# Patient Record
Sex: Male | Born: 2005
Health system: Southern US, Community
[De-identification: ages and names within clinical notes are randomized; demographics above are authoritative.]

## PROBLEM LIST (undated history)

## (undated) VITALS — BP 121/90 | HR 80 | Temp 97.8°F | Resp 14 | Ht 65.55 in | Wt 192.7 lb

## (undated) DIAGNOSIS — T4145XA Adverse effect of unspecified anesthetic, initial encounter: Secondary | ICD-10-CM

## (undated) DIAGNOSIS — L309 Dermatitis, unspecified: Secondary | ICD-10-CM

## (undated) DIAGNOSIS — S92353A Displaced fracture of fifth metatarsal bone, unspecified foot, initial encounter for closed fracture: Secondary | ICD-10-CM

## (undated) DIAGNOSIS — F329 Major depressive disorder, single episode, unspecified: Secondary | ICD-10-CM

## (undated) DIAGNOSIS — J45909 Unspecified asthma, uncomplicated: Secondary | ICD-10-CM

## (undated) DIAGNOSIS — T8859XA Other complications of anesthesia, initial encounter: Secondary | ICD-10-CM

## (undated) DIAGNOSIS — F32A Depression, unspecified: Secondary | ICD-10-CM

## (undated) DIAGNOSIS — Z889 Allergy status to unspecified drugs, medicaments and biological substances status: Secondary | ICD-10-CM

## (undated) DIAGNOSIS — F649 Gender identity disorder, unspecified: Secondary | ICD-10-CM

## (undated) HISTORY — DX: Unspecified asthma, uncomplicated: J45.909

## (undated) HISTORY — PX: TONSILLECTOMY: SUR1361

## (undated) HISTORY — DX: Displaced fracture of fifth metatarsal bone, unspecified foot, initial encounter for closed fracture: S92.353A

## (undated) HISTORY — DX: Gender identity disorder, unspecified: F64.9

## (undated) HISTORY — DX: Allergy status to unspecified drugs, medicaments and biological substances: Z88.9

## (undated) HISTORY — PX: TYMPANOSTOMY TUBE PLACEMENT: SHX32

---
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
## 2023-01-01 MED ORDER — BACITRACIN-NEOMYCIN-POLYMYXIN OINTMENT TUBE: TOPICAL_OINTMENT | CUTANEOUS | Status: DC | PRN

## 2023-01-01 NOTE — Group Note (Signed)
LCSW Group Therapy Note   Group Date: 01/01/2023 Start Time: 1330 End Time: 1448   Type of Therapy and Topic:  Group Therapy: What is bullying and How to deal with Bullying   Participation Level:  Minimal             Description of Group:   In this group session, patients learned how to define bullying and explored the different types of bullying. Patients were asked to identify examples of each type. Patients were encouraged to share a personal story related to bulling and how they handled it. Patients analyzed their responses. Patients were provided cards with different ways people bully: rumors, harassment, name calling, intimidation, hate speech, threats, etc. Patients were asked what ways people bully are the easiest to deal with and the hardest. CSW provided education on reasons people bully and the ways to handle bullying. CSW utilized animated video clips to explore positive ways the scenarios could be handled. CSW closed group with positive quotes related to bullying and overcoming bullying.   Therapeutic Goals: Patients will learn the definition and types of bullying. Patients will share personal stories and analyze their responses.  Patients will be provided education and positive ways to cope with bullying. Patients will utilize video clips to identify solutions for scenarios related to bullying.  Patients will get a hard copy of positive quotes related to bullying and are encouraged to use positive self talk.   Summary of Patient Progress:  Patient was engaged and participated in the activity as well as discussion. Patient shared his thoughts about hearing and dealing with bullying. Patient described different types of cyber bullying he has experienced being on the game. Patient was appropriate and respectful to his peers.     Therapeutic Modalities:   Cognitive Behavioral Therapy Motivational Interviewing  Brief Therapy  Beather Arbour  01/01/2023 3:27 PM

## 2023-01-01 NOTE — Progress Notes (Signed)
[facility] MD Progress Note  01/01/2023 12:55 PM Antonio [name]  MRN:  [id number]  Subjective: " I have been doing fine, able to socialize with other people and not have any urges to cut myself not suicidal and learning better coping mechanisms by participating group activities."  Reason for Admission: Antonio [name] "Antonio [name]" is an 17 year old transgender male on hormone therapy, domiciled with biological mother and stepfather, 12th grader at [employer] with PPhx of social anxiety disorder, MDD, GAD, ASD who presented Voluntary to [facility] after lacerating arm with pencil sharpener blade. Patient has had similar incidents occur in the past few months.   On evaluation the patient reported: He is a reported that he has been participating in treatment program, attending group activities socializing with peer members and communicating well with the staff on the unit.  Patient reported he had a good sleep last night his appetite has been fine.  Patient reported his medication naltrexone was increased which she has been tolerating well without having any side effects.  Patient continued to work on his coping skills which he had multiple of them including writing poetry, listening music, writing music, walking away from problems or spending time with his pet dog and playing video games, drawing and talking with the parents and therapist.  Patient reported he did not use his coping skills before he cut himself.  Patient reported he cuts exactly same place.  Patient minimizes his symptoms of depression anxiety and anger by rating 0 on the scale of 1-10, 10 being the highest severity.    Patient denies side effects to the medications, reporting that they are helpful with their depression and anxiety. Patient states goal today is to work on using coping skills. I discussed he needs to work on addressing reasons he lacerates his left forearm in same spot.  Patient denied SI/HI/AVH, and contract for safety while  being in hospital and minimized current safety issues. Patient had no other questions or concerns, and was amenable to plan per below.    Principal Problem: Deliberate self-cutting Diagnosis: Principal Problem:   Deliberate self-cutting Active Problems:   Gender dysphoria   Social anxiety disorder   Generalized anxiety disorder   Total Time spent with patient: 30 minutes  Past Psychiatric History: As mentioned in history and physical, reviewed today and no additional data.   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   Asthma    Bupropion overdose 01/16/2020   Complication of anesthesia    gets disoriented and shakes after surgery   Deliberate self-cutting 10/17/2018   Depression    Eczema    Fracture of 5th metatarsal    Gender dysphoria    Intentional overdose of drug in tablet form 04/03/2020   MDD (major depressive disorder), recurrent severe, without psychosis 11/02/2021   Moderate episode of recurrent major depressive disorder 04/24/2020   Multiple allergies    Severe episode of recurrent major depressive disorder, without psychotic features 01/02/2020   Suicidal ideations 08/31/2022   Suicide attempt by cutting of wrist 07/28/2022    Past Surgical History:  Procedure Laterality Date   SUPPRELIN IMPLANT Left 01/22/2019   Procedure: SUPPRELIN IMPLANT;  Surgeon: Kandice Hams, MD;  Location: [facility];  Service: Pediatrics;  Laterality: Left;   SUPPRELIN REMOVAL N/A 03/03/2020   Procedure: SUPPRELIN REMOVAL;  Surgeon: Kandice Hams, MD;  Location: [facility];  Service: Pediatrics;  Laterality: N/A;   TONSILLECTOMY     TYMPANOSTOMY TUBE PLACEMENT     Family History:  Family History  Problem Relation Age of Onset   Depression Mother    Anxiety disorder Mother    Hypertension Maternal Grandmother    Allergic rhinitis Neg  Hx    Angioedema Neg Hx    Atopy Neg Hx    Eczema Neg Hx    Immunodeficiency Neg Hx    Urticaria Neg Hx    Family Psychiatric  History: As mentioned in history and physical, reviewed today no additional data.  Social History:  Social History   Substance and Sexual Activity  Alcohol Use No     Social History   Substance and Sexual Activity  Drug Use Yes   Types: Marijuana    Social History   Socioeconomic History   Marital status: Single    Spouse name: Not on file   Number of children: Not on file   Years of education: Not on file   Highest education level: Not on file  Occupational History   Not on file  Tobacco Use   Smoking status: Never   Smokeless tobacco: Never  Vaping Use   Vaping Use: Former   Substances: Nicotine, Flavoring   Devices: Nicotine  Substance and Sexual Activity   Alcohol use: No   Drug use: Yes    Types: Marijuana   Sexual activity: Not Currently  Other Topics Concern   Not on file  Social History Narrative   Lives with mom and stepdad.      He is in 12th grade at [employer] 23-24 school year      He enjoys writing and listen to music   Social Determinants of Health   Financial Resource Strain: Not on file  Food Insecurity: Not on file  Transportation Needs: Not on file  Physical Activity: Not on file  Stress: Not on file  Social Connections: Not on file   Additional Social History:     Current Medications: Current Facility-Administered Medications  Medication Dose Route Frequency Provider Last Rate Last Admin   alum & mag hydroxide-simeth (MAALOX/MYLANTA) 200-200-20 MG/5ML suspension 30 mL  30 mL Oral Q6H PRN Motley-Mangrum, Jadeka A, PMHNP       ARIPiprazole (ABILIFY) tablet 5 mg  5 mg Oral QHS Motley-Mangrum, Jadeka A, PMHNP   5 mg at 12/31/22 2058   busPIRone (BUSPAR) tablet 5 mg  5 mg Oral BID Motley-Mangrum, Jadeka A, PMHNP   5 mg at 01/01/23 0813   diphenhydrAMINE (BENADRYL) capsule 50 mg  50 mg Oral TID PRN  Motley-Mangrum, Jadeka A, PMHNP       Or   diphenhydrAMINE (BENADRYL) injection 50 mg  50 mg Intramuscular TID PRN Motley-Mangrum, Jadeka A, PMHNP       fluticasone (FLONASE) 50 MCG/ACT nasal spray 1 spray  1 spray Each Nare BID PRN Motley-Mangrum, Jadeka A, PMHNP       haloperidol (HALDOL) tablet 5 mg  5 mg Oral TID PRN Motley-Mangrum, Jadeka A, PMHNP       Or   haloperidol lactate (HALDOL) injection 5 mg  5 mg Intramuscular TID PRN Motley-Mangrum, Jadeka A, PMHNP       hydrOXYzine (ATARAX) tablet 25 mg  25 mg Oral TID PRN Motley-Mangrum, Jadeka A, PMHNP   25 mg at 12/30/22 2054   loratadine (CLARITIN) tablet 10 mg  10 mg Oral Daily Park Pope, MD   10 mg at  01/01/23 0813   LORazepam (ATIVAN) tablet 2 mg  2 mg Oral TID PRN Motley-Mangrum, Geralynn Ochs A, PMHNP       Or   LORazepam (ATIVAN) injection 2 mg  2 mg Intramuscular TID PRN Motley-Mangrum, Jadeka A, PMHNP       magnesium hydroxide (MILK OF MAGNESIA) suspension 30 mL  30 mL Oral Daily PRN Motley-Mangrum, Jadeka A, PMHNP       naltrexone (DEPADE) tablet 100 mg  100 mg Oral Daily Park Pope, MD   100 mg at 01/01/23 0813   propranolol (INDERAL) tablet 10 mg  10 mg Oral QHS Motley-Mangrum, Jadeka A, PMHNP   10 mg at 12/31/22 2058   [START ON 01/02/2023] testosterone cypionate (DEPOTESTOSTERONE CYPIONATE) injection 80 mg  80 mg Intramuscular Q7 days Motley-Mangrum, Jadeka A, PMHNP       venlafaxine XR (EFFEXOR-XR) 24 hr capsule 150 mg  150 mg Oral Q breakfast Motley-Mangrum, Jadeka A, PMHNP   150 mg at 01/01/23 8413    Lab Results:  Results for orders placed or performed during the hospital encounter of 12/29/22 (from the past 48 hour(s))  Hemoglobin A1c     Status: None   Collection Time: 12/30/22  6:46 PM  Result Value Ref Range   Hgb A1c MFr Bld 5.1 4.8 - 5.6 %    Comment: (NOTE) Pre diabetes:          5.7%-6.4%  Diabetes:              >6.4%  Glycemic control for   <7.0% adults with diabetes    Mean Plasma Glucose 99.67 mg/dL     Comment: Performed at [facility], [address] [place], [place] [address]  Lipid panel     Status: Abnormal   Collection Time: 12/30/22  6:46 PM  Result Value Ref Range   Cholesterol 200 (H) 0 - 169 mg/dL   Triglycerides 027 <253 mg/dL   HDL 40 (L) >66 mg/dL   Total CHOL/HDL Ratio 5.0 RATIO   VLDL 23 0 - 40 mg/dL   LDL Cholesterol 440 (H) 0 - 99 mg/dL    Comment:        Total Cholesterol/HDL:CHD Risk Coronary Heart Disease Risk Table                     Men   Women  1/2 Average Risk   3.4   3.3  Average Risk       5.0   4.4  2 X Average Risk   9.6   7.1  3 X Average Risk  23.4   11.0        Use the calculated Patient Ratio above and the CHD Risk Table to determine the patient's CHD Risk.        ATP III CLASSIFICATION (LDL):  <100     mg/dL   Optimal  347-425  mg/dL   Near or Above                    Optimal  130-159  mg/dL   Borderline  956-387  mg/dL   High  >564     mg/dL   Very High Performed at [facility], [address], [place], [place] [address]   TSH     Status: None   Collection Time: 12/30/22  6:46 PM  Result Value Ref Range   TSH 4.391 0.350 - 4.500 uIU/mL    Comment: Performed by a 3rd  Generation assay with a functional sensitivity of <=0.01 uIU/mL. Performed at [facility], [address] [place], [place] [address]     Blood Alcohol level:  Lab Results  Component Value Date   ETH <10 12/28/2022   ETH <10 11/02/2022    Metabolic Disorder Labs: Lab Results  Component Value Date   HGBA1C 5.1 12/30/2022   MPG 99.67 12/30/2022   MPG 105 09/01/2022   Lab Results  Component Value Date   PROLACTIN 10.0 09/01/2022   PROLACTIN 10.3 04/16/2022   Lab Results  Component Value Date   CHOL 200 (H) 12/30/2022   TRIG 114 12/30/2022   HDL 40 (L) 12/30/2022   CHOLHDL 5.0 12/30/2022   VLDL 23 12/30/2022   LDLCALC 137 (H) 12/30/2022   LDLCALC 170 (H) 09/01/2022    Physical Findings: AIMS:  , ,  ,  ,     CIWA:    COWS:     Musculoskeletal: Strength & Muscle Tone: within normal limits Gait & Station: normal Patient leans: N/A   Psychiatric Specialty Exam: Presentation  General Appearance:  Appropriate for Environment; Casual   Eye Contact: Good   Speech: Clear and Coherent; Normal Rate   Speech Volume: Normal   Handedness: Right   Mood and Affect  Mood: Euthymic   Affect: Appropriate; Congruent   Thought Process  Thought Processes: Coherent; Goal Directed; Linear   Descriptions of Associations:Intact   Orientation:Full (Time, Place and Person)   Thought Content:Logical   History of Schizophrenia/Schizoaffective disorder:No data recorded  Duration of Psychotic Symptoms:No data recorded Hallucinations:No data recorded   Ideas of Reference:None   Suicidal Thoughts:No data recorded   Homicidal Thoughts:No data recorded   Sensorium  Memory: Remote Good   Judgment: Poor   Insight: Fair   Art therapist  Concentration: Fair   Attention Span: Fair   Recall: [employer] of Knowledge: Fair   Language: Fair   Psychomotor Activity  Psychomotor Activity: No data recorded   Assets  Assets: Manufacturing systems engineer; Housing; Social Support   Sleep  Sleep: No data recorded   Physical Exam: Physical Exam Blood pressure 129/84, pulse 76, temperature 98.4 F (36.9 C), resp. rate 17, height 5' 3.78" (1.62 m), weight 89.7 kg, SpO2 100 %. Body mass index is 34.19 kg/m.  Labs    Latest Ref Rng & Units 12/28/2022    1:41 PM 11/02/2022   12:50 PM 09/01/2022    7:25 AM  CMP  Glucose 70 - 99 mg/dL 85  85  86   BUN 6 - 20 mg/dL 12  13  13    Creatinine 0.44 - 1.00 mg/dL 6.04  5.40  9.81   Sodium 135 - 145 mmol/L 132  138  140   Potassium 3.5 - 5.1 mmol/L 4.1  4.0  4.3   Chloride 98 - 111 mmol/L 104  102  101   CO2 22 - 32 mmol/L 24  26  31    Calcium 8.9 - 10.3 mg/dL 9.0  9.4  9.6   Total Protein 6.5 - 8.1  g/dL 8.2   8.7   Total Bilirubin 0.3 - 1.2 mg/dL 0.5   0.7   Alkaline Phos 38 - 126 U/L 82   78   AST 15 - 41 U/L 24   17   ALT 0 - 44 U/L 29   17       Latest Ref Rng & Units 12/28/2022    1:41 PM 11/02/2022   12:50 PM  09/01/2022    7:25 AM  CBC  WBC 4.0 - 10.5 K/uL 5.2  5.0  5.4   Hemoglobin 12.0 - 15.0 g/dL 16.1  09.6  04.5   Hematocrit 36.0 - 46.0 % 41.5  39.9  51.9   Platelets 150 - 400 K/uL 282  283  274    UPT negative, UDS negative, ethanol negative  Treatment Plan Summary: Reviewed current treatment plan on 01/01/2023  Continue safety monitoring and current medications. He reported that he has a plans about keeping himself safe and also family has a plans about residential placement and coming summer . Will continue his current medication Abilify 5 mg daily at bedtime, BuSpar 5 mg 2 times daily, Atarax 25 mg 3 times daily as needed, Effexor XR 150 mg daily with breakfast and propranolol 10 mg daily at bedtime and also continue his Depo testosterone was 80 mg intramuscular every 7 days.  Patient naltrexone was increased to 100 mg daily and continue Flonase and Claritin without any changes.  Disposition plans are in progress and estimated date of discharge 01/02/2023.  Staffed with attending Dr. Elsie Saas Will maintain Q 15 minutes observation for safety.  Estimated LOS:  5-7 days Reviewed admission lab: as above. Patient has no new labs on 01/01/2023 Patient will participate in  group, milieu, and family therapy. Psychotherapy:  Social and Doctor, hospital, anti-bullying, learning based strategies, cognitive behavioral, and family object relations individuation separation intervention psychotherapies can be considered.  Hx of MDD  Adjustment Disorder  Gender Dysphoria: Continue Abilify 5 mg daily as adjunct for depressive symptoms Continue buspirone 5 mg twice daily Continue Effexor XR 150 mg daily Continue testosterone 80 mg IM every 7 days for hormone therapy  (Sundays) Continue naltrexone 100 mg daily Continue propranolol 10 daily Will continue to monitor patient's mood and behavior. Discharge concerns will also be addressed:  Safety, stabilization, and access to medication Tentative Dispo Date: 01/02/23   Total duration of encounter: 3 days    Signed: Leata Mouse, MD  01/01/2023, 12:55 PM

## 2023-01-01 NOTE — Progress Notes (Signed)
"  TJ" rates sleep as "Good". He denies SI/HI/AVH. Pt endorses increase stress from AP calculus and ability to catch up on missed assignments. No new c/o's. Pt remains safe.

## 2023-01-01 NOTE — [facility] Group Notes (Signed)
[facility] Group Notes:  (Nursing/MHT/Case Management/Adjunct)  Date:  01/01/2023  Time:  11:19 AM  Type of Therapy:  Group Topic/ Focus: Goals Group: The focus of this group is to help patients establish daily goals to achieve during treatment and discuss how the patient can incorporate goal setting into their daily lives to aide in recovery.    Participation Level:  Active   Participation Quality:  Appropriate   Affect:  Appropriate   Cognitive:  Appropriate   Insight:  Appropriate   Engagement in Group:  Engaged   Modes of Intervention:  Discussion   Summary of Progress/Problems:   Patient attended and participated goals group today. No SI/HI. Patient's goal for today is to make a list of triggers  [name] 01/01/2023, 11:19 AM

## 2023-01-01 NOTE — Progress Notes (Signed)
Collateral: [name] (Mother/Legal Guardian)  Patient was stressed about school after his trip from [place].  Patient has been diagnosed with dissociative identity disorder and borderline personality disorder by therapist.  Mother reports that patient is attending DBT but the "personality" that needs the DBT support is least often present.  The self injures behaviors are usually signs of a "darker" personality of his. I discussed possibility that the cutting event can be dissociated as a form of trauma. Patient is working with therapist to better refine his diagnosis. All questions addressed.  Park Pope, MD PGY2 Psychiatry Resident

## 2023-01-02 DIAGNOSIS — Z7289 Other problems related to lifestyle: Secondary | ICD-10-CM

## 2023-01-02 DIAGNOSIS — F649 Gender identity disorder, unspecified: Secondary | ICD-10-CM

## 2023-01-02 DIAGNOSIS — F332 Major depressive disorder, recurrent severe without psychotic features: Secondary | ICD-10-CM | POA: Diagnosis present

## 2023-01-02 MED ORDER — NALTREXONE HCL 50 MG PO TABS: 100.0000 mg | ORAL_TABLET | Freq: Every day | ORAL | 0 refills | Status: DC

## 2023-01-02 MED ORDER — ARIPIPRAZOLE 5 MG PO TABS: 5.0000 mg | ORAL_TABLET | Freq: Every day | ORAL | 0 refills | Status: DC

## 2023-01-02 MED ORDER — TRAZODONE HCL 50 MG PO TABS: 50.0000 mg | ORAL_TABLET | Freq: Every evening | ORAL | 0 refills | Status: DC | PRN

## 2023-01-02 MED ORDER — BUSPIRONE HCL 5 MG PO TABS: 5.0000 mg | ORAL_TABLET | Freq: Two times a day (BID) | ORAL | 0 refills | Status: DC

## 2023-01-02 MED ORDER — VENLAFAXINE HCL ER 150 MG PO CP24
150.0000 mg | ORAL_CAPSULE | Freq: Every day | ORAL | 0 refills | Status: DC
Start: 2023-01-02 — End: 2023-01-27

## 2023-01-02 MED ORDER — PROPRANOLOL HCL 10 MG PO TABS: 10.0000 mg | ORAL_TABLET | Freq: Every day | ORAL | 0 refills | Status: DC

## 2023-01-02 NOTE — [facility] Suicide Risk Assessment (Signed)
[facility] Discharge Suicide Risk Assessment   Principal Problem: Deliberate self-cutting Discharge Diagnoses: Principal Problem:   Deliberate self-cutting Active Problems:   Gender dysphoria   Social anxiety disorder   Generalized anxiety disorder   MDD (major depressive disorder), recurrent severe, without psychosis   Total Time spent with patient: 15 minutes  Musculoskeletal: Strength & Muscle Tone: within normal limits Gait & Station: normal Patient leans: N/A  Psychiatric Specialty Exam  Presentation  General Appearance:  Appropriate for Environment; Casual  Eye Contact: Good  Speech: Clear and Coherent  Speech Volume: Normal  Handedness: Right   Mood and Affect  Mood: Euthymic  Duration of Depression Symptoms: No data recorded Affect: Appropriate; Congruent   Thought Process  Thought Processes: Coherent; Goal Directed  Descriptions of Associations:Intact  Orientation:Full (Time, Place and Person)  Thought Content:Logical  History of Schizophrenia/Schizoaffective disorder:No data recorded Duration of Psychotic Symptoms:No data recorded Hallucinations:Hallucinations: None  Ideas of Reference:None  Suicidal Thoughts:Suicidal Thoughts: No  Homicidal Thoughts:Homicidal Thoughts: No   Sensorium  Memory: Immediate Good; Recent Good; Remote Good  Judgment: Intact  Insight: Good   Executive Functions  Concentration: Good  Attention Span: Good  Recall: Good  Fund of Knowledge: Good  Language: Good   Psychomotor Activity  Psychomotor Activity: Psychomotor Activity: Normal   Assets  Assets: Communication Skills; Desire for Improvement; Housing; Leisure Time; Tax adviser; Talents/Skills; Social Support; Physical Health   Sleep  Sleep: Sleep: Good Number of Hours of Sleep: 9   Physical Exam: Physical Exam ROS Blood pressure 122/78, pulse (!) 106, temperature 97.7 F (36.5 C), resp. rate  17, height 5' 3.78" (1.62 m), weight 89.7 kg, SpO2 99 %. Body mass index is 34.19 kg/m.  Mental Status Per Nursing Assessment::   On Admission:  Suicidal ideation indicated by patient, Self-harm behaviors, Self-harm thoughts  Demographic Factors:  Adolescent or young adult and FTM trans  Loss Factors: NA  Historical Factors: Prior suicide attempts and Impulsivity  Risk Reduction Factors:   Sense of responsibility to family, Religious beliefs about death, Living with another person, especially a relative, Positive social support, Positive therapeutic relationship, and Positive coping skills or problem solving skills  Continued Clinical Symptoms:  Depression:   Impulsivity More than one psychiatric diagnosis Previous Psychiatric Diagnoses and Treatments  Cognitive Features That Contribute To Risk:  Polarized thinking    Suicide Risk:  Minimal: No identifiable suicidal ideation.  Patients presenting with no risk factors but with morbid ruminations; may be classified as minimal risk based on the severity of the depressive symptoms   Follow-up Information     [facility] Follow up.   Why: You have an appointment for therapy services on 4/25 at 5:00pm. This appointment will be held in person. Contact information: [address] [place] [address] [phone]         [facility] Follow up.   Specialty: Behavioral Health Why: You have an appointment with Dr. Marquis Lunch for medication management on 01/11/2023 at 1:00pm. Contact information: [address] [place] [address] [phone]                Plan Of Care/Follow-up recommendations:  Activity:  As tolerated Diet:  Regular  Leata Mouse, MD 01/02/2023, 9:30 AM

## 2023-01-02 NOTE — Discharge Summary (Signed)
Physician Discharge Summary Note  Patient:  Antonio [name] is an 17 y.o., adult MRN:  [id number] DOB:  Jan 24, 2006 Patient phone:  [phone] (home)  Patient address:   [address] [place] [facility] [address],  Total Time spent with patient: 30 minutes  Date of Admission:  12/29/2022 Date of Discharge: 01/02/2023   Reason for Admission:  Antonio [name] "TJ" is an 17 year old transgender male on hormone therapy, domiciled with biological mother and stepfather, 12th grader at [employer] with PPhx of social anxiety disorder, MDD, GAD, ASD who presented Voluntary to [facility] after lacerating arm with pencil sharpener blade. Patient has had similar incidents occur in the past few months.   Principal Problem: Deliberate self-cutting Discharge Diagnoses: Principal Problem:   Deliberate self-cutting Active Problems:   Gender dysphoria   Social anxiety disorder   Generalized anxiety disorder   MDD (major depressive disorder), recurrent severe, without psychosis   Past Psychiatric History:  As mentioned in history and physical, reviewed today and no additional data.   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   Asthma    Bupropion overdose 01/16/2020   Complication of anesthesia    gets disoriented and shakes after surgery   Deliberate self-cutting 10/17/2018   Depression    Eczema    Fracture of 5th metatarsal    Gender dysphoria    Intentional overdose of drug in tablet form 04/03/2020   MDD (major depressive disorder), recurrent severe, without psychosis 11/02/2021   Moderate episode of recurrent major depressive disorder 04/24/2020   Multiple allergies    Severe episode of recurrent major depressive disorder, without psychotic features 01/02/2020   Suicidal ideations 08/31/2022   Suicide attempt by cutting of wrist 07/28/2022    Past Surgical  History:  Procedure Laterality Date   SUPPRELIN IMPLANT Left 01/22/2019   Procedure: SUPPRELIN IMPLANT;  Surgeon: Kandice Hams, MD;  Location: [facility];  Service: Pediatrics;  Laterality: Left;   SUPPRELIN REMOVAL N/A 03/03/2020   Procedure: SUPPRELIN REMOVAL;  Surgeon: Kandice Hams, MD;  Location: [facility];  Service: Pediatrics;  Laterality: N/A;   TONSILLECTOMY     TYMPANOSTOMY TUBE PLACEMENT     Family History:  Family History  Problem Relation Age of Onset   Depression Mother    Anxiety disorder Mother    Hypertension Maternal Grandmother    Allergic rhinitis Neg Hx    Angioedema Neg Hx    Atopy Neg Hx    Eczema Neg Hx    Immunodeficiency Neg Hx    Urticaria Neg Hx    Family Psychiatric  History:  As mentioned in history and physical, reviewed today and no additional data.  Social History:  Social History   Substance and Sexual Activity  Alcohol Use No     Social History   Substance and Sexual Activity  Drug Use Yes   Types: Marijuana    Social History   Socioeconomic History   Marital status: Single    Spouse name: Not on file   Number of children: Not on file   Years of education: Not on file   Highest education level: Not on file  Occupational History   Not on file  Tobacco Use   Smoking status: Never   Smokeless tobacco: Never  Vaping Use   Vaping Use: Former   Substances: Nicotine, Flavoring   Devices: Nicotine  Substance and Sexual Activity   Alcohol use: No   Drug use: Yes    Types: Marijuana   Sexual activity: Not Currently  Other Topics Concern   Not on file  Social History Narrative   Lives with mom and stepdad.      He is in 12th grade at [employer] 23-24 school year      He enjoys writing and listen to music   Social Determinants of Health   Financial Resource Strain: Not on file  Food Insecurity: Not on file  Transportation Needs: Not on file  Physical Activity: Not on file   Stress: Not on file  Social Connections: Not on file    Hospital Course:  Patient was admitted to the Child and Adolescent  unit at [facility] under the service of Dr. Elsie Saas. Safety:Placed in Q15 minutes observation for safety. During the course of this hospitalization patient did not required any change on his observation and no PRN or time out was required.  No major behavioral problems reported during the hospitalization.  Routine labs reviewed: CMP sodium 1 4200 total protein 8.2, lipids-total cholesterol 200, HDL 40, LDL 1.37, triglycerides 114 and VLDL 23, CBC with differential-RBC 5.24 and the MCV 79.2 eosinophilic absolute 0.7, glucose 85, hemoglobin A1c 5.1, TSH is 4.391 and SARS coronavirus negative and urine tox screen nondetected, and EKG-NSR. An individualized treatment plan according to the patient's age, level of functioning, diagnostic considerations and acute behavior was initiated.  Preadmission medications, according to the guardian, consisted of Effexor XR 150 mg daily, trazodone 50 mg daily at bedtime as needed, propranolol 10 mg daily at bedtime, BuSpar 5 mg 2 times daily, Abilify 5 mg daily at bedtime, Zyrtec 10 mg as needed, testosterone injections 80 mg into the skin every Sunday, omeprazole 40 mg daily, naltrexone 50 mg daily, Flonase as needed and albuterol inhaler every 4 hours as needed for wheezing and shortness of breath. During this hospitalization he participated in all forms of therapy including  group, milieu, and family therapy.  Patient met with his psychiatrist on a daily basis and received full nursing service.  Due to long standing mood/behavioral symptoms the patient was started on started on his home medication as listed below.  Patient compliant with his medication venlafaxine 150 mg daily with breakfast and naltrexone was titrated to 100 mg daily, continue his Flonase and Claritin and required Neosporin as needed for the wound care.   Patient has no emotional or behavioral problems and able to tolerate inpatient program and actively participated milieu therapy group therapeutic activities and learn coping mechanisms and also able to practice during this hospitalization.  Patient mother is supportive for his care.  Patient has not required agitation protocol to be used and denied self-injurious behavior suicidal ideation and contract for safety throughout this hospitalization at the time of discharge.  Patient completed suicide safety plan and also patient mother received suicide prevention education as a part of the disposition plan.  Patient will be referred to the outpatient medication management and counseling services as listed below.  Permission was granted from the guardian.  There were no major adverse effects from the medication.   Patient was able to verbalize reasons for his  living and appears to have a positive outlook toward his future.  A safety plan was discussed with him and his guardian.  He was provided with national suicide Hotline phone # 1-800-273-TALK as well as [facility]  number.  Patient medically stable  and baseline physical exam within normal limits with no abnormal findings. The patient appeared to benefit from the structure and consistency of the inpatient setting, continue current medication regimen and integrated therapies. During the hospitalization patient gradually improved as evidenced by: Denied suicidal ideation, homicidal ideation, psychosis, depressive symptoms subsided.   He displayed an overall improvement in mood, behavior and affect. He was more cooperative and responded positively to redirections and limits set by the staff. The patient was able to verbalize age appropriate coping methods for use at home and school. At discharge conference was held during which findings, recommendations, safety plans and aftercare plan were discussed with the caregivers. Please refer to the  therapist note for further information about issues discussed on family session. On discharge patients denied psychotic symptoms, suicidal/homicidal ideation, intention or plan and there was no evidence of manic or depressive symptoms.  Patient was discharge home on stable condition  Musculoskeletal: Strength & Muscle Tone: within normal limits Gait & Station: normal Patient leans: N/A   Psychiatric Specialty Exam:  Presentation  General Appearance:  Appropriate for Environment; Casual  Eye Contact: Good  Speech: Clear and Coherent  Speech Volume: Normal  Handedness: Right   Mood and Affect  Mood: Euthymic  Affect: Appropriate; Congruent   Thought Process  Thought Processes: Coherent; Goal Directed  Descriptions of Associations:Intact  Orientation:Full (Time, Place and Person)  Thought Content:Logical  History of Schizophrenia/Schizoaffective disorder:No data recorded Duration of Psychotic Symptoms:No data recorded Hallucinations:Hallucinations: None  Ideas of Reference:None  Suicidal Thoughts:Suicidal Thoughts: No  Homicidal Thoughts:Homicidal Thoughts: No   Sensorium  Memory: Immediate Good; Recent Good; Remote Good  Judgment: Intact  Insight: Good   Executive Functions  Concentration: Good  Attention Span: Good  Recall: Good  Fund of Knowledge: Good  Language: Good   Psychomotor Activity  Psychomotor Activity: Psychomotor Activity: Normal   Assets  Assets: Communication Skills; Desire for Improvement; Housing; Leisure Time; Tax adviser; Talents/Skills; Social Support; Physical Health   Sleep  Sleep: Sleep: Good Number of Hours of Sleep: 9    Physical Exam: Physical Exam ROS Blood pressure 122/78, pulse (!) 106, temperature 97.7 F (36.5 C), resp. rate 17, height 5' 3.78" (1.62 m), weight 89.7 kg, SpO2 99 %. Body mass index is 34.19 kg/m.   Social History   Tobacco Use   Smoking Status Never  Smokeless Tobacco Never   Tobacco Cessation:  N/A, patient does not currently use tobacco products   Blood Alcohol level:  Lab Results  Component Value Date   ETH <10 12/28/2022   ETH <10 11/02/2022    Metabolic Disorder Labs:  Lab Results  Component Value Date   HGBA1C 5.1 12/30/2022   MPG 99.67 12/30/2022   MPG 105 09/01/2022   Lab Results  Component Value Date   PROLACTIN 10.0 09/01/2022   PROLACTIN 10.3 04/16/2022   Lab Results  Component Value Date   CHOL 200 (H) 12/30/2022   TRIG 114 12/30/2022   HDL 40 (L) 12/30/2022   CHOLHDL 5.0 12/30/2022   VLDL 23 12/30/2022   LDLCALC 137 (H) 12/30/2022   LDLCALC 170 (H) 09/01/2022    See Psychiatric Specialty Exam and Suicide Risk Assessment completed by Attending Physician prior to discharge.  Discharge destination:  Home  Is patient on multiple antipsychotic therapies at discharge:  No   Has Patient had three or more failed trials of antipsychotic monotherapy by history:  No  Recommended Plan for Multiple Antipsychotic Therapies: NA  Discharge Instructions  Activity as tolerated - No restrictions   Complete by: As directed    Diet general   Complete by: As directed    Discharge instructions   Complete by: As directed    Discharge Recommendations:  The patient is being discharged with his family. Patient is to take his discharge medications as ordered.  See follow up above. We recommend that he participate in individual therapy to target depression, DID, borderlilne and trans with gender dysphoria. We recommend that he participate in  family therapy to target the conflict with his family, to improve communication skills and conflict resolution skills.  Family is to initiate/implement a contingency based behavioral model to address patient's behavior. We recommend that he get AIMS scale, height, weight, blood pressure, fasting lipid panel, fasting blood sugar in three months from  discharge as he's on atypical antipsychotics.  Patient will benefit from monitoring of recurrent suicidal ideation since patient is on antidepressant medication. The patient should abstain from all illicit substances and alcohol.  If the patient's symptoms worsen or do not continue to improve or if the patient becomes actively suicidal or homicidal then it is recommended that the patient return to the closest hospital emergency room or call 911 for further evaluation and treatment. National Suicide Prevention Lifeline 1800-SUICIDE or 484-630-8632. Please follow up with your primary medical doctor for all other medical needs.  The patient has been educated on the possible side effects to medications and he/his guardian is to contact a medical professional and inform outpatient provider of any new side effects of medication. He s to take regular diet and activity as tolerated.  Will benefit from moderate daily exercise. Family was educated about removing/locking any firearms, medications or dangerous products from the home.   Increase activity slowly   Complete by: As directed       Allergies as of 01/02/2023       Reactions   Egg-derived Products Anaphylaxis   Justicia Adhatoda Anaphylaxis, Other (See Comments)   All "TREE NUTS"   Other Anaphylaxis, Other (See Comments)   Tree Nuts   Peanut-containing Drug Products Anaphylaxis        Medication List     TAKE these medications      Indication  albuterol 108 (90 Base) MCG/ACT inhaler Commonly known as: VENTOLIN HFA Inhale 1-2 puffs into the lungs every 4 (four) hours as needed for wheezing or shortness of breath.  Indication: Asthma   ARIPiprazole 5 MG tablet Commonly known as: ABILIFY Take 1 tablet (5 mg total) by mouth at bedtime.  Indication: Major Depressive Disorder   busPIRone 5 MG tablet Commonly known as: BUSPAR Take 1 tablet (5 mg total) by mouth 2 (two) times daily.  Indication: Anxiety Disorder   EpiPen 2-Pak  0.3 mg/0.3 mL Soaj injection Generic drug: EPINEPHrine Inject 0.3 mg into the muscle as needed for anaphylaxis (AS DIRECTED).  Indication: Life-Threatening Hypersensitivity Reaction   fluticasone 50 MCG/ACT nasal spray Commonly known as: FLONASE Place 1 spray into both nostrils 2 (two) times daily as needed for allergies or rhinitis.  Indication: Stuffy Nose   naltrexone 50 MG tablet Commonly known as: DEPADE Take 2 tablets (100 mg total) by mouth daily. Start taking on: January 03, 2023 What changed: how much to take  Indication: self harm and impulsivity.   omeprazole 40 MG capsule Commonly known as: PRILOSEC Take 40 mg by mouth daily before breakfast.  Indication: Heartburn   Pataday 0.1 % ophthalmic solution Generic drug: olopatadine Place 1 drop  into both eyes 3 (three) times daily as needed for allergies.  Indication: dry eyes   propranolol 10 MG tablet Commonly known as: INDERAL Take 1 tablet (10 mg total) by mouth at bedtime.  Indication: Feeling Anxious   Testosterone Cypionate 200 MG/ML Soln Inject 80 mg into the skin every Sunday.  Indication: Transgender Man   traZODone 50 MG tablet Commonly known as: DESYREL Take 1 tablet (50 mg total) by mouth at bedtime as needed for sleep. What changed: how much to take  Indication: Trouble Sleeping   TYLENOL 500 MG tablet Generic drug: acetaminophen Take 500-1,000 mg by mouth every 6 (six) hours as needed for mild pain or headache.  Indication: Pain   venlafaxine XR 150 MG 24 hr capsule Commonly known as: EFFEXOR-XR Take 1 capsule (150 mg total) by mouth daily with breakfast.  Indication: Major Depressive Disorder   ZyrTEC Allergy 10 MG tablet Generic drug: cetirizine Take 10 mg by mouth daily as needed for allergies or rhinitis.  Indication: Hayfever        Follow-up Information     [facility] Follow [facility].   Why: You have an appointment for therapy services on 4/25 at 5:00pm. This appointment will be  held in person. Contact information: [address] [place] [address] [phone]         [facility] Follow up.   Specialty: Behavioral Health Why: You have an appointment with Dr. Marquis Lunch for medication management on 01/11/2023 at 1:00pm. Contact information: [address] [place] [address] [phone]                Follow-up recommendations:  Activity:  As tolerated Diet:  Regular  Comments: Follow discharge instructions  Signed: Leata Mouse, MD 01/02/2023, 9:39 AM

## 2023-01-02 NOTE — [facility] Group Notes (Signed)
Group Topic/Focus:  Goals Group:   The focus of this group is to help patients establish daily goals to achieve during treatment and discuss how the patient can incorporate goal setting into their daily lives to aide in recovery.  Participation Level:  Active  Participation Quality:  Appropriate  Affect:  Appropriate  Cognitive:  Appropriate  Insight:  Appropriate  Engagement in Group:  Engaged  Modes of Intervention:  Education  Additional Comments:  Pt attended goals group. Pt goal is to prepare for discharge. Pt is feeling no anger or SI today. Pt nurse has been notified.

## 2023-01-02 NOTE — [facility] Group Notes (Signed)
Pt attended anxiety group and participated. Pt was able to present in front of peers and a handout was given.  

## 2023-01-02 NOTE — Progress Notes (Signed)
D) Pt received calm, visible, participating in milieu, and in no acute distress. Pt A & O x4. Pt denies SI, HI, A/ V H, depression, anxiety and pain at this time. A) Pt encouraged to drink fluids. Pt encouraged to come to staff with needs. Pt encouraged to attend and participate in groups. Pt encouraged to set reachable goals.  R) Pt remained safe on unit, in no acute distress, will continue to assess.     01/01/23 2000  Psych Admission Type (Psych Patients Only)  Admission Status Voluntary  Psychosocial Assessment  Patient Complaints None  Eye Contact Fair  Facial Expression Anxious  Affect Appropriate to circumstance  Speech Logical/coherent  Interaction Assertive  Motor Activity Fidgety  Appearance/Hygiene Unremarkable  Behavior Characteristics Cooperative  Mood Anxious;Pleasant  Thought Process  Coherency WDL  Content WDL  Delusions None reported or observed  Perception WDL  Hallucination None reported or observed  Judgment Impaired  Confusion None  Danger to Self  Current suicidal ideation? Denies  Agreement Not to Harm Self Yes  Description of Agreement verbal  Danger to Others  Danger to Others None reported or observed

## 2023-01-02 NOTE — Progress Notes (Signed)
Discharge Note:   AVS reviewed with Pt and family. Belongings returned. Pt denies SI/HI/AVH. Suicide safety plan completed and copy given. Survey completed. Pt and family escorted to lobby.  

## 2023-01-02 NOTE — Progress Notes (Signed)
[facility] Child/Adolescent Case Management Discharge Plan :  Will you be returning to the same living situation after discharge: Yes,  with family At discharge, do you have transportation home?:Yes,  family Do you have the ability to pay for your medications:Yes,  insurance  Release of information consent forms completed and in the chart;  Patient's signature needed at discharge.  Patient to Follow up at:  Follow-up Information     [facility].   Why: You have an appointment for therapy services on 4/25 at 5:00pm. This appointment will be held in person. Contact information: [address] [place] [address] [phone]         [facility] Follow up.   Specialty: Behavioral Health Why: You have an appointment with Dr. Marquis Lunch for medication management on 01/11/2023 at 1:00pm. Contact information: [address] [facility] [place] [address] [phone]                Family Contact:  Telephone:  Spoke with:  mother  Patient denies SI/HI:   Yes,  per discharge SRA     Safety Planning and Suicide Prevention discussed:  Yes,  with mother    Lynnell Chad 01/02/2023, 8:21 AM

## 2023-01-08 ENCOUNTER — Other Ambulatory Visit: Payer: Self-pay

## 2023-01-08 ENCOUNTER — Emergency Department (HOSPITAL_COMMUNITY): Payer: 59

## 2023-01-08 ENCOUNTER — Encounter (HOSPITAL_COMMUNITY): Payer: Self-pay

## 2023-01-08 ENCOUNTER — Emergency Department (HOSPITAL_COMMUNITY)
Admission: EM | Admit: 2023-01-08 | Discharge: 2023-01-08 | Disposition: A | Discharge status: Home / Self Care | Payer: 59 | Attending: Emergency Medicine | Admitting: Emergency Medicine

## 2023-01-08 DIAGNOSIS — J45909 Unspecified asthma, uncomplicated: Secondary | ICD-10-CM | POA: Diagnosis not present

## 2023-01-08 DIAGNOSIS — Z7951 Long term (current) use of inhaled steroids: Secondary | ICD-10-CM | POA: Insufficient documentation

## 2023-01-08 DIAGNOSIS — Z4802 Encounter for removal of sutures: Secondary | ICD-10-CM | POA: Insufficient documentation

## 2023-01-08 DIAGNOSIS — Z91010 Allergy to peanuts: Secondary | ICD-10-CM | POA: Diagnosis not present

## 2023-01-08 NOTE — Discharge Instructions (Addendum)
Your x-ray today did not show any retained foreign body. I recommend close follow-up with PCP for reevaluation.  Please do not hesitate to return to emergency department if worrisome signs symptoms we discussed become apparent.

## 2023-01-08 NOTE — ED Triage Notes (Signed)
Pt to er, pt states that he was here about two weeks ago for si, states that he cut himself and he is here today to have the sutures out.  States that he also swallowed a razor blade and needs a repeat x ray to make sure it passed.  Pt denies SI at this time

## 2023-01-09 NOTE — ED Provider Notes (Signed)
[place] EMERGENCY DEPARTMENT AT [facility] Provider Note   CSN: [id number] Arrival date & time: 01/08/23  1419     History  Chief Complaint  Patient presents with   Suture / Staple Removal    Antonio [name] is a 17 y.o. adult with a past medical history of asthma, depression, SI presents to the emergency department for evaluation of foreign body and suture removal.  Patient states that he was evaluated in the emergency department 11 days ago for suicidal ideation.  States that he cut himself with a pencil sharpener and swallowed a razor.  He is here today for suture removal and to confirm passing of the razor that he swallowed 11 days ago.  He denies coughing any blood, blood in his stool.  Denies any chest pain, abdominal pain back pain.  Denies SI/HI today.  Denies any pus or fluid drainage from the laceration wound.   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   Asthma    Bupropion overdose 01/16/2020   Complication of anesthesia    gets disoriented and shakes after surgery   Deliberate self-cutting 10/17/2018   Depression    Eczema    Fracture of 5th metatarsal    Gender dysphoria    Intentional overdose of drug in tablet form (HCC) 04/03/2020   MDD (major depressive disorder), recurrent severe, without psychosis (HCC) 11/02/2021   Moderate episode of recurrent major depressive disorder (HCC) 04/24/2020   Multiple allergies    Severe episode of recurrent major depressive disorder, without psychotic features (HCC) 01/02/2020   Suicidal ideations 08/31/2022   Suicide attempt by cutting of wrist (HCC) 07/28/2022   Past Surgical History:  Procedure Laterality Date   SUPPRELIN IMPLANT Left 01/22/2019   Procedure: SUPPRELIN IMPLANT;  Surgeon: Kandice Hams, MD;  Location: [facility];  Service: Pediatrics;  Laterality: Left;    SUPPRELIN REMOVAL N/A 03/03/2020   Procedure: SUPPRELIN REMOVAL;  Surgeon: Kandice Hams, MD;  Location: [facility];  Service: Pediatrics;  Laterality: N/A;   TONSILLECTOMY     TYMPANOSTOMY TUBE PLACEMENT       Home Medications Prior to Admission medications   Medication Sig Start Date End Date Taking? Authorizing Provider  albuterol (VENTOLIN HFA) 108 (90 Base) MCG/ACT inhaler Inhale 1-2 puffs into the lungs every 4 (four) hours as needed for wheezing or shortness of breath. 08/11/22   [provider]  ARIPiprazole (ABILIFY) 5 MG tablet Take 1 tablet (5 mg total) by mouth at bedtime. 01/02/23 02/01/23  Leata Mouse, MD  busPIRone (BUSPAR) 5 MG tablet Take 1 tablet (5 mg total) by mouth 2 (two) times daily. 01/02/23   Leata Mouse, MD  EPINEPHrine (EPIPEN 2-PAK) 0.3 mg/0.3 mL IJ SOAJ injection Inject 0.3 mg into the muscle as needed for anaphylaxis (AS DIRECTED).    [provider]  fluticasone (FLONASE) 50 MCG/ACT nasal spray Place 1 spray into both nostrils 2 (two) times daily as needed for allergies or rhinitis.    [provider]  naltrexone (DEPADE) 50 MG tablet Take 2 tablets (100 mg total) by mouth daily. 01/03/23   Leata Mouse, MD  omeprazole (PRILOSEC) 40 MG capsule Take 40 mg by mouth daily before breakfast.    [provider]  PATADAY 0.1 % ophthalmic solution Place 1 drop into both eyes 3 (three) times daily as  needed for allergies.    [provider]  propranolol (INDERAL) 10 MG tablet Take 1 tablet (10 mg total) by mouth at bedtime. 01/02/23   Leata Mouse, MD  Testosterone Cypionate 200 MG/ML SOLN Inject 80 mg into the skin every Sunday. 11/14/22   Dessa Phi, MD  traZODone (DESYREL) 50 MG tablet Take 1 tablet (50 mg total) by mouth at bedtime as needed for sleep. 01/02/23   Leata Mouse, MD  TYLENOL 500 MG tablet Take 500-1,000 mg by mouth every 6 (six) hours as  needed for mild pain or headache.    [provider]  venlafaxine XR (EFFEXOR-XR) 150 MG 24 hr capsule Take 1 capsule (150 mg total) by mouth daily with breakfast. 01/02/23   Leata Mouse, MD  ZYRTEC ALLERGY 10 MG tablet Take 10 mg by mouth daily as needed for allergies or rhinitis.    [provider]      Allergies    Egg-derived products, Bevelyn Buckles, Other, and Peanut-containing drug products    Review of Systems   Review of Systems Negative except as per HPI.  Physical Exam Updated Vital Signs BP 125/84   Pulse 90   Temp 98.7 F (37.1 C) (Oral)   Resp 18   Ht 5\' 5"  (1.651 m)   Wt 90.7 kg   SpO2 96%   BMI 33.28 kg/m  Physical Exam Vitals and nursing note reviewed.  Constitutional:      Appearance: Normal appearance.  HENT:     Head: Normocephalic and atraumatic.     Mouth/Throat:     Mouth: Mucous membranes are moist.  Eyes:     General: No scleral icterus. Cardiovascular:     Rate and Rhythm: Normal rate and regular rhythm.     Pulses: Normal pulses.     Heart sounds: Normal heart sounds.  Pulmonary:     Effort: Pulmonary effort is normal.     Breath sounds: Normal breath sounds.  Abdominal:     General: Abdomen is flat.     Palpations: Abdomen is soft.     Tenderness: There is no abdominal tenderness.  Musculoskeletal:        General: No deformity.  Skin:    General: Skin is warm.     Findings: No rash.     Comments: Well-healing 15 cm laceration wound the left wrist.  No bleeding, pus or fluid drainage noted.  No overlying skin erythema noted.  Neurological:     General: No focal deficit present.     Mental Status: He is alert.  Psychiatric:        Mood and Affect: Mood normal.     Comments: Denies SI/HI.  Denies visual or auditory hallucinations.     ED Results / Procedures / Treatments   Labs (all labs ordered are listed, but only abnormal results are displayed) Labs Reviewed - No data to  display  EKG None  Radiology DG Abdomen 1 View  Result Date: 01/08/2023 CLINICAL DATA:  foreign body (razor) EXAM: ABDOMEN - 1 VIEW upright. COMPARISON:  X-ray abdomen 10/30/2021, chest x-ray 01/08/2023 FINDINGS: Limited evaluation due to overlapping osseous structures and overlying soft tissues. Upper abdomen not visualized on x-ray abdomen; however, visualized on chest x-ray 01/08/2023-no free intraperitoneal gas identified. The bowel gas pattern is normal. No radio-opaque calculi or other significant radiographic abnormality are seen. IMPRESSION: 1. No radiopaque retained foreign body. Limited evaluation due to overlapping osseous structures and overlying soft tissues. 2. Nonobstructive bowel gas pattern. 3. No  free intraperitoneal gas. Electronically Signed   By: Tish Frederickson M.D.   On: 01/08/2023 18:49   DG Chest 2 View  Result Date: 01/08/2023 CLINICAL DATA:  Patient swallowed a razor blade. EXAM: CHEST - 2 VIEW COMPARISON:  None Available. FINDINGS: The heart size and mediastinal contours are within normal limits. Both lungs are clear. The visualized skeletal structures are unremarkable. IMPRESSION: No active cardiopulmonary disease. No metallic foreign body seen. Electronically Signed   By: Ted Mcalpine M.D.   On: 01/08/2023 18:49    Procedures .Suture Removal  Date/Time: 01/09/2023 8:39 AM  Performed by: Jeanelle Malling, PA Authorized by: Jeanelle Malling, PA   Consent:    Consent obtained:  Verbal   Consent given by:  Patient   Risks discussed:  Bleeding, pain and wound separation Universal protocol:    Procedure explained and questions answered to patient or proxy's satisfaction: yes     Relevant documents present and verified: yes     Patient identity confirmed:  Arm band and verbally with patient Location:    Location: L wrist. Procedure details:    Wound appearance:  No signs of infection and nonpurulent   Number of sutures removed:  12 Post-procedure details:     Post-removal:  No dressing applied   Procedure completion:  Tolerated well, no immediate complications     Medications Ordered in ED Medications - No data to display  ED Course/ Medical Decision Making/ A&P                             Medical Decision Making Amount and/or Complexity of Data Reviewed Radiology: ordered.   17 year old presents today for suture removal and evaluation for retained foreign body.  There is a well-healing 15 cm laceration wound on the left wrist.  No active bleeding, pus or fluid drainage noted.  Suture removal done by this provider.  I removed a total of 12 stitches.  Patient tolerated procedure well.  Wound is closely approximated.  Patient also reports he swallowed a razor 11 days ago.  He is not sure if he has passed the razor.  He swallowed a razor about a year ago which he has passed.  He denies any abdominal pain, chest pain, back pain.  X-ray of the chest and abdomen did not show any foreign body.  Though the x-ray is limited due to osseous structure and overlying soft tissues I have very low suspicion for foreign body given patient has not passed any blood, vomited blood, chest pain, back pain.  He denies SI/HI today.  Denies any visual hallucination.  I recommend patient to follow-up with outpatient psychiatry for further evaluation and management.  Strict return precaution given.   Disposition Continued outpatient therapy. Follow-up with PCP and psychiatry recommended for reevaluation of symptoms. Treatment plan discussed with patient.  Pt acknowledged understanding was agreeable to the plan. Worrisome signs and symptoms were discussed with patient, and patient acknowledged understanding to return to the ED if they noticed these signs and symptoms. Patient was stable upon discharge.   This chart was dictated using voice recognition software.  Despite best efforts to proofread,  errors can occur which can change the documentation meaning.           Final Clinical Impression(s) / ED Diagnoses Final diagnoses:  Visit for suture removal    Rx / DC Orders ED Discharge Orders     None  Jeanelle Malling, PA 01/09/23 1610    Pricilla Loveless, MD 01/09/23 2255

## 2023-01-11 ENCOUNTER — Telehealth (INDEPENDENT_AMBULATORY_CARE_PROVIDER_SITE_OTHER): Payer: 59 | Admitting: Child and Adolescent Psychiatry

## 2023-01-11 DIAGNOSIS — F649 Gender identity disorder, unspecified: Secondary | ICD-10-CM

## 2023-01-11 DIAGNOSIS — F840 Autistic disorder: Secondary | ICD-10-CM

## 2023-01-11 DIAGNOSIS — F411 Generalized anxiety disorder: Secondary | ICD-10-CM

## 2023-01-11 DIAGNOSIS — F39 Unspecified mood [affective] disorder: Secondary | ICD-10-CM

## 2023-01-11 MED ORDER — NALTREXONE HCL 50 MG PO TABS: 50.0000 mg | ORAL_TABLET | Freq: Every day | ORAL | 0 refills | Status: DC

## 2023-01-11 MED ORDER — BUSPIRONE HCL 10 MG PO TABS: 10.0000 mg | ORAL_TABLET | Freq: Two times a day (BID) | ORAL | 0 refills | Status: DC

## 2023-01-11 MED ORDER — ARIPIPRAZOLE 10 MG PO TABS: 10.0000 mg | ORAL_TABLET | Freq: Every day | ORAL | 0 refills | Status: DC

## 2023-01-11 NOTE — Progress Notes (Signed)
Virtual Visit via Video Note  I connected with Antonio [name] on 01/11/23 at  1:00 PM EDT by a video enabled telemedicine application and verified that I am speaking with the correct person using two identifiers.  Location: Patient: home Provider: office   I discussed the limitations of evaluation and management by telemedicine and the availability of in person appointments. The patient expressed understanding and agreed to proceed.  I discussed the assessment and treatment plan with the patient. The patient was provided an opportunity to ask questions and all were answered. The patient agreed with the plan and demonstrated an understanding of the instructions.   The patient was advised to call back or seek an in-person evaluation if the symptoms worsen or if the condition fails to improve as anticipated.    [name], MD    [facility] MD/PA/NP OP Progress Note  01/11/2023 8:30 AM Antonio [name]  MRN:  [id number]  Chief Complaint: Medication management follow-up.  Synopsis: Antonio [name] "Antonio [name]" is an 17 year old assigned male at birth, identifying self as transgender male, prefers male pronouns he/him/his and name Antonio [name],  domiciled with biological mother and stepfather, rising 12th grader at [employer].    His psychiatric history significant of history of social anxiety disorder, major depressive disorder. He has hx of 6 psychiatric hospitalizations  in the context of suicide attempt by cutting, by OD on Wellbutrin XL 150 mg x 6 tablets, at [facility] between 06/03 to 06/12 for aggressive behaviors at therapist office and suicidal thoughts, at [facility] between 07/21 to 07/30 for OD on Ibuprofen 600 mg in a span of about three months. At [facility] for about a week in 10/2021 for cutting and then swallowing the blade. He was admitted at [facility] in 04/2022 after cutting self on the left wrist deep that required sutures on subcutaneous fat layer and 9 sutures on the skin and last at  [facility] for a week in 07/2022 for suicide attempt via cutting self deep enough that required 6 stitches.  He was admitted to [facility] for 3 days in December 2023 in the context of suicidal thoughts secondary to death of his dog who lived with him for 14 years. He was again admitted in 12/2022 for cutting self in the context of suicidal thoughts, cutting was deep enough that he required 4 stitches.   He was previously followed by Dr. Milana Kidney since 2019.  Patient was scheduled to see Dr. Milana Kidney after the discharge from [facility] however they requested to change the provider and made the appointment with this provider for medication management follow-up in 01/2020.   Past med trials -  Zoloft up to 150 mg once a day Pristiq up to 50 mg once a day Prozac up to 20 mg once a day and was discontinued because of vivid dreams during his stay at [facility].  Wellbutrin XL 150 mg was tried very briefly after the discharge from [facility] and was switched over to Effexor during his hospitalization at [facility].  Was on Effexor XR 225 mg daily, Guanfacine ER 2 mg at bedtime, Seroquel 25 mg in AM and 50 mg QHS, Naltrexone 50 mg Qdaily and Atarax 25 mg q6 hours PRN and was started on Seroquel 50 mg QHS and 25 mg q6hours as needed along with Trazodone 50 mg QHS.  At [facility] - his Lamictal was discontinued and Seroquel 25 mg was added, he was also started on Propranolol.  At [facility], his  effexor was changed to 75 mg daily, he was started on Latuda 40 mg daily, seroquel was discontinued, and trazodone/propranolol/guanfacine were continued.  Since then effexor was increased back to 150 mg daily for depression and Latuda was switched to Abiilify.   Pt was receiving therapy at [facility] in [place] for therapy after the discharge from [facility] and now seeing Ms. Normajean Glasgow at [facility].  He had a psychological evaluation for diagnostic clarification due to concerns for autism spectrum disorder in February 2022 and  he was subsequently diagnosed with ASD.   Summary of psychological evaluation is as below: "Jeanet was evaluated during February 2022 related to emotional regulation and social interaction difficulty.  Hollin presents with history of depressed mood, gender dysphoria and suicide attempts.  During hospitalization in psychotherapy, provider suggested that Neena be evaluated for autism spectrum disorder due to trouble interacting with others, problems reading nonverbal expressions, restricted patterns of interest, resistance to change, and sensory hypersensitivity.  Testing was recommended to evaluate for ASD along with other conditions that may be affecting tolerance emotional state and behavior.  Test results indicated above average overall intelligence(K-BIT 2) with average verbal comprehension and high nonverbal reasoning.  Neurocognitive testing indicated that Marissa appears to have age typical or better ability attending simple and complex information, shifting attention, mental tracking, memory unresponsiveness, with low typical ability to recognize facial expression.  Ratings for behavioral and emotional functioning indicated significant endorsement of traumatic stress, borderline personality traits and depression.  Borderline personality traits are often related to unresolved trauma.  While some endorsement of schizophrenia related to symptoms was endorsed(social attachment and disorganized thought), psychoticism (hallucinations and delusions were denied).  Testing for autism spectrum disorder indicated some difficulty with reciprocal social interaction with direct observation and restricted repetitive behavior.  Parent and self-report ratings indicated more difficulty in these areas, at the level that meets the criteria for ASD.  Recommendations include discussing results with psychiatrist, continuing individual counseling, seeking parent behavioral consultation along with accessing appropriate community  services.  DSM-V diagnoses include autism spectrum disorder level 1 needs support, MDD recurrent severe without psychotic features, PTSD."    HPI:   Antonio [name] was seen and evaluated over telemedicine encounter for medication management follow-up.  He was evaluated alone and I spoke with his mother over the phone to obtain collateral information and discuss her treatment plan with his written informed consent to allow his mother involved in his treatment.  This consent was signed previously and is scanned in the chart.  Chart review suggests that in the interim since last appointment patient was admitted to [facility] for about a week from 12/28/2022 to 01/04/2023 for lacerating his left forearm with a pencil sharpener blade.  He was continued on his home medication except increasing the dose of naltrexone to 100 mg daily during the hospitalization.  During the evaluation today he appeared less engaged, more guarded. He says that he does not recall the events that led to his suicide attempt by cutting.  He says that he was feeling stressed and therefore walked into the woods and lacerated his forearm.  He could not specify any specific stressors that occurred that day or prior to that. He says that subsequently he himself called 911.  He does report stress in the context of upcoming school exams mainly personal finance and AP Calc, has exams in upcoming 2 weeks.  He reports that hospitalization was "okay", did not find it very helpful.  He reports that  he had thoughts of self-harm, and suicidal thoughts about a week ago and denies any thoughts since then.  He says that his mood has been "content", rates it at 7 out of 10, 10 being the best mood however his affect did not appear congruent with reported mood.  He denies any current SI or HI.  He talked about his weekend, went to shop to select prom outfit, has Prom on 05/17.  He continues to deny excessive anxiety, problems with sleep except waking up frequently during the  night but still says that sleep is restful, denies problems with appetite, or energy.  He does report that he plans to go to adult residential treatment center after he graduates from high school.  He says that he had an appointment with his therapist on the weekend, and it went okay.  He is also attending group [facility] but did not have it last week because therapist was sick.  His mother provides collateral information, and reports that they did not notice any warning signs before he attempted self-harm and since the discharge from the hospital he is back to his usual self, more engaged, being out of his room more and is getting up with his schoolwork.  She says that after the appointment he has told her that he does not want to go back to school today because his stomach is hurting.  Mother reports that it felt like episode of cutting self was planned to go to [facility] because he had a list of songs that he wanted to listen while he was at [facility] in his pocket.  Mother reports that she found for blades under the sole from his shoes. Mother also reports that patient discussed to his therapist last week that he swallowed the blade after he cut himself, they went back to the emergency room subsequently, apparently he did not have any bleeding and on x-rays of the chest and abdomen they did not find any foreign bodies.  Patient told mother that he did not want to get hold up in the emergency room so that is why he did not tell about swallowing the blade initially.  We discussed concerns regarding safety, advised mother to continue with safety precautions, recommended increasing the dose of Abilify to 7.5 mg for 1 week and then to 10 mg daily after discussing risks and benefits.  Also discussed this with patient as well.  Both patient and parent verbalized understanding.  Discussed increasing the dose of Abilify to use as an adjunct for depression.  We also discussed to increase the dose of BuSpar  to 10 mg twice daily for anxiety.  Mother has reached out to [facility] residential treatment center but they do not take their insurance, and has also reached out to [facility] but the are not giving her information because patient is an adult and he has to do the intake with them.  Mother will be reaching out to [facility] in [place] as well.  I asked mother to have patient sign release of information for therapist, and there is residential treatment center so if they need referral I can send them to them.  Mother verbalized understanding.  Visit Diagnosis:    ICD-10-CM   1. Mood disorder (HCC)  F39     2. Gender dysphoria  F64.9     3. Autism spectrum disorder requiring support (level 1)  F84.0     4. Generalized anxiety disorder  F41.1          Past Psychiatric History: As mentioned in initial H&P, reviewed today, no change 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   Asthma    Bupropion overdose 01/16/2020   Complication of anesthesia    gets disoriented and shakes after surgery   Deliberate self-cutting 10/17/2018   Depression    Eczema    Fracture of 5th metatarsal    Gender dysphoria    Intentional overdose of drug in tablet form (HCC) 04/03/2020   MDD (major depressive disorder), recurrent severe, without psychosis (HCC) 11/02/2021   Moderate episode of recurrent major depressive disorder (HCC) 04/24/2020   Multiple allergies    Severe episode of recurrent major depressive disorder, without psychotic features (HCC) 01/02/2020   Suicidal ideations 08/31/2022   Suicide attempt by cutting of wrist (HCC) 07/28/2022    Past Surgical History:  Procedure Laterality Date   SUPPRELIN IMPLANT Left 01/22/2019   Procedure: SUPPRELIN IMPLANT;  Surgeon: Kandice Hams, MD;  Location: [facility];  Service:  Pediatrics;  Laterality: Left;   SUPPRELIN REMOVAL N/A 03/03/2020   Procedure: SUPPRELIN REMOVAL;  Surgeon: Kandice Hams, MD;  Location: [facility];  Service: Pediatrics;  Laterality: N/A;   TONSILLECTOMY     TYMPANOSTOMY TUBE PLACEMENT      Family Psychiatric History: As mentioned in initial H&P, reviewed today, no change  Family History:  Family History  Problem Relation Age of Onset   Depression Mother    Anxiety disorder Mother    Hypertension Maternal Grandmother    Allergic rhinitis Neg Hx    Angioedema Neg Hx    Atopy Neg Hx    Eczema Neg Hx    Immunodeficiency Neg Hx    Urticaria Neg Hx     Social History:  Social History   Socioeconomic History   Marital status: Single    Spouse name: Not on file   Number of children: Not on file   Years of education: Not on file   Highest education level: Not on file  Occupational History   Not on file  Tobacco Use   Smoking status: Never   Smokeless tobacco: Never  Vaping Use   Vaping Use: Former   Substances: Nicotine, Flavoring   Devices: Nicotine  Substance and Sexual Activity   Alcohol use: No   Drug use: Yes    Types: Marijuana   Sexual activity: Not Currently  Other Topics Concern   Not on file  Social History Narrative   Lives with mom and stepdad.      He is in 12th grade at [employer] 23-24 school year      He enjoys writing and listen to music   Social Determinants of Health   Financial Resource Strain: Not on file  Food Insecurity: Not on file  Transportation Needs: Not on file  Physical Activity: Not on file  Stress: Not on file  Social Connections: Not on file    Allergies:  Allergies  Allergen Reactions   Egg-Derived Products Anaphylaxis   Justicia Adhatoda Anaphylaxis and Other (See Comments)    All "TREE NUTS"   Other Anaphylaxis and Other (See Comments)    Tree Nuts   Peanut-Containing Drug Products Anaphylaxis    Metabolic Disorder Labs: Lab Results   Component Value Date   HGBA1C 5.1 12/30/2022   MPG 99.67 12/30/2022  MPG 105 09/01/2022   Lab Results  Component Value Date   PROLACTIN 10.0 09/01/2022   PROLACTIN 10.3 04/16/2022   Lab Results  Component Value Date   CHOL 200 (H) 12/30/2022   TRIG 114 12/30/2022   HDL 40 (L) 12/30/2022   CHOLHDL 5.0 12/30/2022   VLDL 23 12/30/2022   LDLCALC 137 (H) 12/30/2022   LDLCALC 170 (H) 09/01/2022   Lab Results  Component Value Date   TSH 4.391 12/30/2022   TSH 2.451 09/01/2022    Therapeutic Level Labs: No results found for: "LITHIUM" No results found for: "VALPROATE" No results found for: "CBMZ"  Current Medications: Current Outpatient Medications  Medication Sig Dispense Refill   albuterol (VENTOLIN HFA) 108 (90 Base) MCG/ACT inhaler Inhale 1-2 puffs into the lungs every 4 (four) hours as needed for wheezing or shortness of breath.     ARIPiprazole (ABILIFY) 10 MG tablet Take 1 tablet (10 mg total) by mouth at bedtime. 30 tablet 0   busPIRone (BUSPAR) 10 MG tablet Take 1 tablet (10 mg total) by mouth 2 (two) times daily. 60 tablet 0   EPINEPHrine (EPIPEN 2-PAK) 0.3 mg/0.3 mL IJ SOAJ injection Inject 0.3 mg into the muscle as needed for anaphylaxis (AS DIRECTED).     fluticasone (FLONASE) 50 MCG/ACT nasal spray Place 1 spray into both nostrils 2 (two) times daily as needed for allergies or rhinitis.     naltrexone (DEPADE) 50 MG tablet Take 1 tablet (50 mg total) by mouth daily. 60 tablet 0   omeprazole (PRILOSEC) 40 MG capsule Take 40 mg by mouth daily before breakfast.     PATADAY 0.1 % ophthalmic solution Place 1 drop into both eyes 3 (three) times daily as needed for allergies.     propranolol (INDERAL) 10 MG tablet Take 1 tablet (10 mg total) by mouth at bedtime. 30 tablet 0   Testosterone Cypionate 200 MG/ML SOLN Inject 80 mg into the skin every Sunday. 4 mL 1   traZODone (DESYREL) 50 MG tablet Take 1 tablet (50 mg total) by mouth at bedtime as needed for sleep. 30  tablet 0   TYLENOL 500 MG tablet Take 500-1,000 mg by mouth every 6 (six) hours as needed for mild pain or headache.     venlafaxine XR (EFFEXOR-XR) 150 MG 24 hr capsule Take 1 capsule (150 mg total) by mouth daily with breakfast. 30 capsule 0   ZYRTEC ALLERGY 10 MG tablet Take 10 mg by mouth daily as needed for allergies or rhinitis.     No current facility-administered medications for this visit.     Musculoskeletal: Strength & Muscle Tone: unable to assess since visit was over the telemedicine. Gait & Station: unable to assess since visit was over the telemedicine. Patient leans: N/A    Psychiatric Specialty Exam: Review of Systems Review of 12 systems negative except as mentioned in HPI   There were no vitals taken for this visit.There is no height or weight on file to calculate BMI.  General Appearance: Casual  Eye Contact:  Good  Speech:  Normal Rate  Volume:  Normal  Mood:  "content."  Affect:  Appropriate, Non-Congruent, and Flat  Thought Process:  Goal Directed and Linear  Orientation:  Full (Time, Place, and Person)  Thought Content: Logical   Suicidal Thoughts:  No  Homicidal Thoughts:  No  Memory:  Immediate;   Good Recent;   Good Remote;   Good  Judgement:  Fair  Insight:  Fair  Psychomotor Activity:  Normal  Concentration:  Concentration: Fair and Attention Span: Fair  Recall:  [employer] of Knowledge: Fair  Language: Fair  Akathisia:  No    AIMS (if indicated): not done  Assets:  Manufacturing systems engineer Desire for Improvement Financial Resources/Insurance Housing Leisure Time Physical Health Social Support Transportation Vocational/Educational  ADL's:  Intact  Cognition: WNL  Sleep:   Fair    Screenings:   Assessment and Plan:   17 year old AFAB  identifies as transgender male and prefers pronoun he/him/his. -  He is genetically predisposed to depression and anxiety disorders. -  His initial presentation was most consistent with MDD, gender  dysphoria, anxiety disorders. -  He had one episode lasting for about 2 days that was most consistent with hypomanic symptoms, therefore diagnostic impression at present is bipolar 2 disorder vs MDD.  -  In addition to this he also presents with borderline personality traits due to his chronic SI, chronic intermittent self harm behaviors, impulsive behaviors, affective instability, anger, and problems with interpersonal relationship.  -  Pt eluded to some emotional abuse which appears to have predisposed him to mental health issues in addition to his genetic predisposition.  -  His cognitive distortions such as polarized thinking, filtering out any positives and staying focused on negative appears to contribute to his distress and he continues to exhibit low distress tolerance despite weekly ind therapy(DBT) - He had psychological evaluation on which he was diagnosed with Autism Spectrum Disorder, MDD, PTSD.   Update on 01/11/23   - He presents for follow-up after about 3 weeks. Was admitted for a week for suicide attempt via cutting that required sutures and disclosed that he swallowed blade following his discharge from [facility]. They went to ER, and CXR/X - ray of abdomen is WNL.  - Does not disclose specific stressors that lead to self harm, but appears to be school stressors, upcoming exams.  Also has hx of cutting to relieve stress. He called EMS by himself apparently after the self harm. -Denies any suicidal thoughts or problems with self-harm behaviors since last week.   -Parent did not observe any spefic warning signs such as worsening of depression or anxiety prior to his episode, which has also been true for previous attempts. Mother also report that pt has been more engaged, staying out of his room.  -He currently denies any SI. Recommending increasing abilify for depression, and buspar for anxiety.  - They will follow up again in about 2 weeks.  - Parents are supportive, accepting and  supporting his gender identity as male, and he is receiving hormonal treatment at this time, he is also future oriented and these all appears to be protective factors for him.  - Mother continues to follow safety precautions at home.      Plan:   # mood(chronic/partially better)/Anxiety(chronic and unstable) -Continue Effexor XR 150 mg once a day. -Increase Abilify to 7.5 mg at bedtime for a week and then 10 mg daily at bedtime. -Increase Buspar to 10 mg twice daily.  -Continue with Atarax 25 mg q6hrs PRN for anxiety and Seroquel 25 mg Q6hrs PRN for agitation/anxiety - Continue Trazodone to 50-100 mg QHS PRN for sleeping difficulties.  -  Continue ind therapy with Ms. Normajean Glasgow, appears to have developed good therapeutic relationship and also attended on family therapy sessions.  .  -Continue propranolol 10 mg daily at bedtime  # Gender Dysphoria  - Defer management to his current endocrinologist.  - Continue with therapy as  above.   # Tics (improved) - Intuniv 2 mg stopped in 07/2022  # Self harm behaviors (improving, and none recently per his report) - Decrease Naltrexone to 50 mg daily due to lack of data on effectiveness and safety of Naltrexone 100 mg daily.   # Pt to send new ROI to speak with his therapist, and for referrals to RTCs.   A suicide and violence risk assessment was performed as part of this evaluation. The patient is deemed to be at chronic elevated risk for self-harm/suicide given the following factors: current diagnosis of Mood Disorder, ASD, Anxiety disorder, gender dysphoria and past hx of suicidal attempts/suicidalthougts/non suicidal self harm behaviors. The patient is deemed to be at chronic elevated risk for violence given the following factors: younger age. These risk factors are mitigated by the following factors:lack of active SI/HI, no known naccess to weapons or firearms, motivation for treatment, utilization of positive coping skills, supportive  family, presence of an available support system, employment or functioning in a structured work/academic setting, current treatment compliance, safe housing and support system in agreement with treatment recommendations. There is no acute risk for suicide or violence at this time. The patient was educated about relevant modifiable risk factors including following recommendations for treatment of psychiatric illness and abstaining from substance abuse. While future psychiatric events cannot be accurately predicted, the patient does not request acute inpatient psychiatric care and does not currently meet [facility] involuntary commitment criteria.      He has a follow up appointment in 2  weeks or early if symptoms worsens.   60 minutes total time for encounter today which included chart review, pt evaluation, collaterals, medication and other treatment discussions, medication orders and charting.     This note was generated in part or whole with voice recognition software. Voice recognition is usually quite accurate but there are transcription errors that can and very often do occur. I apologize for any typographical errors that were not detected and corrected.             [name], MD 01/11/2023, 5:52 PM

## 2023-01-24 ENCOUNTER — Other Ambulatory Visit (HOSPITAL_COMMUNITY): Payer: Self-pay | Admitting: Psychiatry

## 2023-01-27 ENCOUNTER — Telehealth (INDEPENDENT_AMBULATORY_CARE_PROVIDER_SITE_OTHER): Payer: 59 | Admitting: Child and Adolescent Psychiatry

## 2023-01-27 DIAGNOSIS — F39 Unspecified mood [affective] disorder: Secondary | ICD-10-CM

## 2023-01-27 DIAGNOSIS — F840 Autistic disorder: Secondary | ICD-10-CM

## 2023-01-27 DIAGNOSIS — F649 Gender identity disorder, unspecified: Secondary | ICD-10-CM | POA: Diagnosis not present

## 2023-01-27 DIAGNOSIS — F4010 Social phobia, unspecified: Secondary | ICD-10-CM | POA: Diagnosis not present

## 2023-01-27 MED ORDER — BUSPIRONE HCL 10 MG PO TABS: 10.0000 mg | ORAL_TABLET | Freq: Two times a day (BID) | ORAL | 0 refills | Status: DC

## 2023-01-27 MED ORDER — VENLAFAXINE HCL ER 150 MG PO CP24
150.0000 mg | ORAL_CAPSULE | Freq: Every day | ORAL | 0 refills | Status: DC
Start: 2023-01-27 — End: 2023-02-14

## 2023-01-27 MED ORDER — PROPRANOLOL HCL 10 MG PO TABS: 10.0000 mg | ORAL_TABLET | Freq: Every day | ORAL | 0 refills | Status: DC

## 2023-01-27 MED ORDER — TRAZODONE HCL 50 MG PO TABS: 50.0000 mg | ORAL_TABLET | Freq: Every evening | ORAL | 0 refills | Status: DC | PRN

## 2023-01-27 MED ORDER — ARIPIPRAZOLE 10 MG PO TABS: 10.0000 mg | ORAL_TABLET | Freq: Every day | ORAL | 0 refills | Status: DC

## 2023-01-27 MED ORDER — NALTREXONE HCL 50 MG PO TABS: 50.0000 mg | ORAL_TABLET | Freq: Every day | ORAL | 0 refills | Status: DC
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
## 2023-02-08 ENCOUNTER — Inpatient Hospital Stay (HOSPITAL_COMMUNITY): Admission: RE | Admit: 2023-02-08 | Payer: 59 | Source: Ambulatory Visit

## 2023-02-09 ENCOUNTER — Encounter (HOSPITAL_COMMUNITY): Payer: Self-pay | Admitting: Psychiatry

## 2023-02-09 ENCOUNTER — Emergency Department (HOSPITAL_COMMUNITY): Payer: 59

## 2023-02-09 ENCOUNTER — Inpatient Hospital Stay (HOSPITAL_COMMUNITY)
Admission: AD | Admit: 2023-02-09 | Discharge: 2023-02-14 | DRG: 885 | Disposition: A | Discharge status: Home / Self Care | Payer: 59 | Source: Intra-hospital | Attending: Psychiatry | Admitting: Psychiatry

## 2023-02-09 ENCOUNTER — Other Ambulatory Visit: Payer: Self-pay

## 2023-02-09 ENCOUNTER — Emergency Department (EMERGENCY_DEPARTMENT_HOSPITAL)
Admission: EM | Admit: 2023-02-09 | Discharge: 2023-02-09 | Disposition: A | Discharge status: Other Acute Inpatient Hospital | Payer: 59 | Source: Home / Self Care | Attending: Emergency Medicine | Admitting: Emergency Medicine

## 2023-02-09 ENCOUNTER — Encounter (HOSPITAL_COMMUNITY): Payer: Self-pay

## 2023-02-09 DIAGNOSIS — Z79899 Other long term (current) drug therapy: Secondary | ICD-10-CM

## 2023-02-09 DIAGNOSIS — R45851 Suicidal ideations: Secondary | ICD-10-CM

## 2023-02-09 DIAGNOSIS — Q211 Atrial septal defect, unspecified: Secondary | ICD-10-CM | POA: Diagnosis not present

## 2023-02-09 DIAGNOSIS — F332 Major depressive disorder, recurrent severe without psychotic features: Secondary | ICD-10-CM | POA: Insufficient documentation

## 2023-02-09 DIAGNOSIS — F649 Gender identity disorder, unspecified: Secondary | ICD-10-CM

## 2023-02-09 DIAGNOSIS — Z818 Family history of other mental and behavioral disorders: Secondary | ICD-10-CM

## 2023-02-09 DIAGNOSIS — Z91010 Allergy to peanuts: Secondary | ICD-10-CM | POA: Insufficient documentation

## 2023-02-09 DIAGNOSIS — F640 Transsexualism: Secondary | ICD-10-CM | POA: Insufficient documentation

## 2023-02-09 DIAGNOSIS — S51812A Laceration without foreign body of left forearm, initial encounter: Secondary | ICD-10-CM | POA: Insufficient documentation

## 2023-02-09 DIAGNOSIS — F4010 Social phobia, unspecified: Secondary | ICD-10-CM

## 2023-02-09 DIAGNOSIS — F411 Generalized anxiety disorder: Secondary | ICD-10-CM | POA: Diagnosis present

## 2023-02-09 DIAGNOSIS — T1491XA Suicide attempt, initial encounter: Secondary | ICD-10-CM | POA: Diagnosis present

## 2023-02-09 DIAGNOSIS — R4588 Nonsuicidal self-harm: Secondary | ICD-10-CM | POA: Diagnosis not present

## 2023-02-09 DIAGNOSIS — Z597 Insufficient social insurance and welfare support: Secondary | ICD-10-CM | POA: Diagnosis not present

## 2023-02-09 DIAGNOSIS — F840 Autistic disorder: Secondary | ICD-10-CM | POA: Diagnosis present

## 2023-02-09 DIAGNOSIS — K219 Gastro-esophageal reflux disease without esophagitis: Secondary | ICD-10-CM | POA: Diagnosis present

## 2023-02-09 DIAGNOSIS — Z7289 Other problems related to lifestyle: Principal | ICD-10-CM

## 2023-02-09 DIAGNOSIS — F39 Unspecified mood [affective] disorder: Secondary | ICD-10-CM

## 2023-02-09 DIAGNOSIS — Z9152 Personal history of nonsuicidal self-harm: Secondary | ICD-10-CM

## 2023-02-09 DIAGNOSIS — X838XXA Intentional self-harm by other specified means, initial encounter: Secondary | ICD-10-CM | POA: Insufficient documentation

## 2023-02-09 DIAGNOSIS — Z9151 Personal history of suicidal behavior: Secondary | ICD-10-CM

## 2023-02-09 LAB — CBC
HCT: 37.7 % (ref 36.0–46.0)
Hemoglobin: 12.1 g/dL (ref 12.0–15.0)
MCH: 24.9 pg — ABNORMAL LOW (ref 26.0–34.0)
MCHC: 32.1 g/dL (ref 30.0–36.0)
MCV: 77.7 fL — ABNORMAL LOW (ref 80.0–100.0)
Platelets: 235 10*3/uL (ref 150–400)
RBC: 4.85 MIL/uL (ref 3.87–5.11)
RDW: 13.7 % (ref 11.5–15.5)
WBC: 5.4 10*3/uL (ref 4.0–10.5)
nRBC: 0 % (ref 0.0–0.2)

## 2023-02-09 LAB — COMPREHENSIVE METABOLIC PANEL
ALT: 21 U/L (ref 0–44)
AST: 18 U/L (ref 15–41)
Albumin: 3.5 g/dL (ref 3.5–5.0)
Alkaline Phosphatase: 76 U/L (ref 38–126)
Anion gap: 8 (ref 5–15)
BUN: 11 mg/dL (ref 6–20)
CO2: 25 mmol/L (ref 22–32)
Calcium: 9.3 mg/dL (ref 8.9–10.3)
Chloride: 104 mmol/L (ref 98–111)
Creatinine, Ser: 1.08 mg/dL — ABNORMAL HIGH (ref 0.44–1.00)
GFR, Estimated: >60 mL/min (ref >60)
Glucose, Bld: 95 mg/dL (ref 70–99)
Potassium: 4 mmol/L (ref 3.5–5.1)
Sodium: 137 mmol/L (ref 135–145)
Total Bilirubin: 0.2 mg/dL — ABNORMAL LOW (ref 0.3–1.2)
Total Protein: 7.1 g/dL (ref 6.5–8.1)

## 2023-02-09 LAB — SALICYLATE LEVEL: Salicylate Lvl: <7 mg/dL — ABNORMAL LOW (ref 7.0–30.0)

## 2023-02-09 LAB — ACETAMINOPHEN LEVEL: Acetaminophen (Tylenol), Serum: <10 ug/mL — ABNORMAL LOW (ref 10–30)

## 2023-02-09 LAB — RAPID URINE DRUG SCREEN, HOSP PERFORMED
Amphetamines: NOT DETECTED
Barbiturates: NOT DETECTED
Benzodiazepines: NOT DETECTED
Cocaine: NOT DETECTED
Opiates: NOT DETECTED
Tetrahydrocannabinol: NOT DETECTED

## 2023-02-09 LAB — ETHANOL: Alcohol, Ethyl (B): <10 mg/dL (ref <10)

## 2023-02-09 LAB — I-STAT BETA HCG BLOOD, ED (MC, WL, AP ONLY): I-stat hCG, quantitative: <5 m[IU]/mL (ref <5)

## 2023-02-09 MED ORDER — VENLAFAXINE HCL ER 150 MG PO CP24
150.0000 mg | ORAL_CAPSULE | Freq: Every day | ORAL | Status: DC
  Administered 2023-02-10 – 2023-02-14 (×5): 150 mg via ORAL
  Filled 2023-02-09 (×7): qty 1

## 2023-02-09 MED ORDER — LORATADINE 10 MG PO TABS
10.0000 mg | ORAL_TABLET | Freq: Every day | ORAL | Status: DC
  Administered 2023-02-10 – 2023-02-14 (×5): 10 mg via ORAL
  Filled 2023-02-09 (×7): qty 1

## 2023-02-09 MED ORDER — EPINEPHRINE 0.3 MG/0.3ML IJ SOAJ: 0.3000 mg | INTRAMUSCULAR | Status: DC | PRN

## 2023-02-09 MED ORDER — TRAZODONE HCL 50 MG PO TABS: 50.0000 mg | ORAL_TABLET | Freq: Every evening | ORAL | Status: DC | PRN

## 2023-02-09 MED ORDER — PROPRANOLOL HCL 10 MG PO TABS
10.0000 mg | ORAL_TABLET | Freq: Every day | ORAL | Status: DC
  Administered 2023-02-09 – 2023-02-13 (×5): 10 mg via ORAL
  Filled 2023-02-09 (×8): qty 1

## 2023-02-09 MED ORDER — ALUM & MAG HYDROXIDE-SIMETH 200-200-20 MG/5ML PO SUSP: 30.0000 mL | Freq: Four times a day (QID) | ORAL | Status: DC | PRN

## 2023-02-09 MED ORDER — LORAZEPAM 1 MG PO TABS: 1.0000 mg | ORAL_TABLET | ORAL | Status: DC | PRN

## 2023-02-09 MED ORDER — OLANZAPINE 5 MG PO TBDP: 5.0000 mg | ORAL_TABLET | Freq: Three times a day (TID) | ORAL | Status: DC | PRN

## 2023-02-09 MED ORDER — ZIPRASIDONE MESYLATE 20 MG IM SOLR: 20.0000 mg | INTRAMUSCULAR | Status: DC | PRN

## 2023-02-09 MED ORDER — NALTREXONE HCL 50 MG PO TABS
50.0000 mg | ORAL_TABLET | Freq: Every day | ORAL | Status: DC
  Administered 2023-02-10 – 2023-02-14 (×5): 50 mg via ORAL
  Filled 2023-02-09 (×8): qty 1

## 2023-02-09 MED ORDER — ALBUTEROL SULFATE HFA 108 (90 BASE) MCG/ACT IN AERS: 1.0000 | INHALATION_SPRAY | RESPIRATORY_TRACT | Status: DC | PRN

## 2023-02-09 MED ORDER — BUSPIRONE HCL 10 MG PO TABS
10.0000 mg | ORAL_TABLET | Freq: Two times a day (BID) | ORAL | Status: DC
  Administered 2023-02-09 – 2023-02-14 (×11): 10 mg via ORAL
  Filled 2023-02-09 (×8): qty 1
  Filled 2023-02-09: qty 2
  Filled 2023-02-09 (×6): qty 1

## 2023-02-09 MED ORDER — FLUTICASONE PROPIONATE 50 MCG/ACT NA SUSP: 1.0000 | Freq: Two times a day (BID) | NASAL | Status: DC | PRN

## 2023-02-09 MED ORDER — ACETAMINOPHEN 500 MG PO TABS: 500.0000 mg | ORAL_TABLET | Freq: Four times a day (QID) | ORAL | Status: DC | PRN

## 2023-02-09 MED ORDER — ARIPIPRAZOLE 10 MG PO TABS
10.0000 mg | ORAL_TABLET | Freq: Every day | ORAL | Status: DC
  Administered 2023-02-09 – 2023-02-10 (×2): 10 mg via ORAL
  Filled 2023-02-09 (×6): qty 1

## 2023-02-09 MED ORDER — LIDOCAINE HCL (PF) 1 % IJ SOLN
30.0000 mL | Freq: Once | INTRAMUSCULAR | Status: AC
  Administered 2023-02-09: 30 mL via INTRADERMAL
  Filled 2023-02-09: qty 30

## 2023-02-09 NOTE — ED Triage Notes (Signed)
Patient presents with multiple self inflicted left wrist lacerations this evening , denies hallucinations , dressings applied prior to arrival .

## 2023-02-09 NOTE — ED Provider Notes (Signed)
[place] EMERGENCY DEPARTMENT AT [facility] Provider Note   CSN: [id number] Arrival date & time: 02/09/23  0259     History  Chief Complaint  Patient presents with   Suicidal    Self inflicted wrist lacerations    Antonio [name] is a 17 y.o. adult.  The history is provided by the patient and medical records.   17 year old transgender male to male, presenting to the ED with suicidal ideation and self-inflicted wounds to left wrist.  Reports she did this about an hour and a half prior to arrival with blade from a pencil sharpener.  Does report intended self-harm but denies suicidal thoughts currently.  Denies any homicidal ideation or hallucinations.  Tetanus UTD.  Of note, patient seen in the ED for similar in February and April 2024.    Home Medications Prior to Admission medications   Medication Sig Start Date End Date Taking? Authorizing Provider  albuterol (VENTOLIN HFA) 108 (90 Base) MCG/ACT inhaler Inhale 1-2 puffs into the lungs every 4 (four) hours as needed for wheezing or shortness of breath. 08/11/22   [provider]  ARIPiprazole (ABILIFY) 10 MG tablet Take 1 tablet (10 mg total) by mouth at bedtime. 01/27/23 02/26/23  [name], MD  busPIRone (BUSPAR) 10 MG tablet Take 1 tablet (10 mg total) by mouth 2 (two) times daily. 01/27/23   [name], MD  EPINEPHrine (EPIPEN 2-PAK) 0.3 mg/0.3 mL IJ SOAJ injection Inject 0.3 mg into the muscle as needed for anaphylaxis (AS DIRECTED).    [provider]  fluticasone (FLONASE) 50 MCG/ACT nasal spray Place 1 spray into both nostrils 2 (two) times daily as needed for allergies or rhinitis.    [provider]  naltrexone (DEPADE) 50 MG tablet Take 1 tablet (50 mg total) by mouth daily. 01/27/23   [name], MD  omeprazole (PRILOSEC) 40 MG capsule Take 40 mg by mouth daily before breakfast.    [provider]  PATADAY 0.1 % ophthalmic solution Place 1 drop into both  eyes 3 (three) times daily as needed for allergies.    [provider]  propranolol (INDERAL) 10 MG tablet Take 1 tablet (10 mg total) by mouth at bedtime. 01/27/23   [name], MD  Testosterone Cypionate 200 MG/ML SOLN Inject 80 mg into the skin every Sunday. 11/14/22   Dessa Phi, MD  traZODone (DESYREL) 50 MG tablet Take 1 tablet (50 mg total) by mouth at bedtime as needed for sleep. 01/27/23   [name], MD  TYLENOL 500 MG tablet Take 500-1,000 mg by mouth every 6 (six) hours as needed for mild pain or headache.    [provider]  venlafaxine XR (EFFEXOR-XR) 150 MG 24 hr capsule Take 1 capsule (150 mg total) by mouth daily with breakfast. 01/27/23   [name], MD  ZYRTEC ALLERGY 10 MG tablet Take 10 mg by mouth daily as needed for allergies or rhinitis.    [provider]      Allergies    Egg-derived products, Bevelyn Buckles, Other, and Peanut-containing drug products    Review of Systems   Review of Systems  Skin:  Positive for wound.  Psychiatric/Behavioral:  Positive for suicidal ideas.   All other systems reviewed and are negative.   Physical Exam Updated Vital Signs BP 138/85 (BP Location: Right Arm)   Pulse 95   Temp 98.6 F (37 C) (Oral)   Resp 18   SpO2 96%  Physical Exam Vitals and nursing note reviewed.  Constitutional:      Appearance: He is well-developed.  HENT:     Head: Normocephalic and atraumatic.  Eyes:     Conjunctiva/sclera: Conjunctivae normal.     Pupils: Pupils are equal, round, and reactive to light.  Cardiovascular:     Rate and Rhythm: Normal rate and regular rhythm.     Heart sounds: Normal heart sounds.  Pulmonary:     Effort: Pulmonary effort is normal.     Breath sounds: Normal breath sounds.  Abdominal:     General: Bowel sounds are normal.     Palpations: Abdomen is soft.  Musculoskeletal:        General: Normal range of motion.     Cervical back: Normal range of motion.      Comments: Left volar forearm with large, approximately 6 inch V-shaped laceration, there is scar tissue present from former laceration in the same area, wound is gaping but there is no active bleeding, multiple surrounding abrasions but no drainage, erythema, or induration  Skin:    General: Skin is warm and dry.  Neurological:     Mental Status: He is alert and oriented to person, place, and time.     ED Results / Procedures / Treatments   Labs (all labs ordered are listed, but only abnormal results are displayed) Labs Reviewed  CBC - Abnormal; Notable for the following components:      Result Value   MCV 77.7 (*)    MCH 24.9 (*)    All other components within normal limits  RAPID URINE DRUG SCREEN, HOSP PERFORMED  COMPREHENSIVE METABOLIC PANEL  ACETAMINOPHEN LEVEL  ETHANOL  SALICYLATE LEVEL  I-STAT BETA HCG BLOOD, ED ([facility], [facility], [facility] ONLY)    EKG None  Radiology DG Wrist Complete Left  Result Date: 02/09/2023 CLINICAL DATA:  17 year old male history of lacerations to the left wrist. EXAM: LEFT WRIST - COMPLETE 3+ VIEW COMPARISON:  No priors. FINDINGS: Three views of the left wrist demonstrate no acute displaced fracture, subluxation or dislocation. No retained radiopaque foreign bodies are noted in the soft tissues. IMPRESSION: 1. No acute osseous abnormality of the left wrist. Electronically Signed   By: Trudie Reed M.D.   On: 02/09/2023 04:58    Procedures Procedures    LACERATION REPAIR Performed by: Garlon Hatchet Authorized by: Garlon Hatchet Consent: Verbal consent obtained. Risks and benefits: risks, benefits and alternatives were discussed Consent given by: patient Patient identity confirmed: provided demographic data Prepped and Draped in normal sterile fashion Wound explored  Laceration Location: left volar forearm/wrist  Laceration Length: 6 inches, v-shaped, varying depth  No Foreign Bodies seen or palpated  Anesthesia: local infiltration  Local  anesthetic: lidocaine 1% without epinephrine  Anesthetic total: 8 ml  Irrigation method: syringe Amount of cleaning: standard  Skin closure: 4-0 prolene  Number of sutures: 17  Technique: simple interrupted  Patient tolerance: Patient tolerated the procedure well with no immediate complications.   Medications Ordered in ED Medications  lidocaine (PF) (XYLOCAINE) 1 % injection 30 mL (30 mLs Intradermal Given 02/09/23 0524)    ED Course/ Medical Decision Making/ A&P                             Medical Decision Making Amount and/or Complexity of Data Reviewed Labs: ordered. Radiology: ordered and independent interpretation performed. ECG/medicine tests: ordered and independent interpretation performed.  Risk Prescription  drug management.   17 year old male presenting to the ED with suicidal ideation and self-inflicted wounds to left volar forearm.  History of same in February and April 2024 as well.  On exam has large, 6 inch V-shaped laceration to left volar forearm.  This is overlying prior scars so there is scar tissue present.  Surrounding abrasions noted.  Tetanus is up-to-date.  Laceration repaired as above, tolerated well, cleansed and bandaged.  Pregnancy test is negative.  CBC is reassuring.  Remainder of labs clotted and required redraw, they are pending.  Morning team to follow-up on labs and medically clear.  Will need TTS consultation.  Final Clinical Impression(s) / ED Diagnoses Final diagnoses:  Suicidal ideation  Self-mutilation  Laceration of left forearm, initial encounter    Rx / DC Orders ED Discharge Orders     None         Garlon Hatchet, PA-C 02/09/23 0640    Nira Conn, MD 02/09/23 [phone]

## 2023-02-09 NOTE — Consult Note (Signed)
Telepsych Consultation   Reason for Consult:  Telepsych Assessment for suicide attempts Referring Physician:  Jesusita Oka  Location of Patient:    [name] Location of Provider: Other: virtual home office  Patient Identification: Antonio [name] MRN:  [id number] Principal Diagnosis: Suicide attempt [facility]) Diagnosis:  Principal Problem:   Suicide attempt [facility]) Active Problems:   Gender dysphoria   Severe episode of recurrent major depressive disorder, without psychotic features (HCC)   Total Time spent with patient: 1.5 hours  Subjective:   Antonio [name] is a 17 y.o. adult patient admitted with. Per RN Triage Note "Patient presents with multiple self inflicted left wrist lacerations this evening , denies hallucinations , dressings applied prior to arrival . "  HPI:   Patient seen via telepsych by this provider; chart reviewed and consulted with Dr. Viviano Simas on 02/09/23.  On evaluation Antonio [name] reports   Pt presents sitting in the hospital bed,he is well groomed and neat, his hair is plaited and he is wearing hospital scrubs.  He does not look disheveled and is not ill appearing.  Pt greeted by this Clinical research associate and anticipatory guidance given.  He agrees to continue with the assessment.  When asked why he's here, he reports he cut his wrist as a suicide attempt.  PT states, "I woke up at 1 or 2 am and decided to cut my wrist.  Yes it was a suicide attempt."  Pt smiles while talking with this Clinical research associate and stating his goal to end his life.  He initially denies triggers but then reports being triggered by planning for his future.  Pt states he's about to graduate from high school and wants to go to [employer] or [employer] to study engineering.  When asked how his actions of trying to end his life lines up with his stated goal of going to college, he states he has other personalities living in him.  Pt states one personality wants to live and the other one does not.  He reports the one that  wants to live is stronger.  Pt reports taking the following medications but is unsure of doses, states his mother gives them to [facility]:[id number], Effexor, Abilify , Trazodone, Propranolol, Buspar.  Pt denies missed dosed or medication side effects (denies tremor, involuntary movements, diaphoresis)  Labs: CMP: Mildly elevated creatinine at 1.08  LFTs within normal limits CBC: without leukocytosis.  Normal hemoglobin.  Acetaminophen, ethanol, and salicylate levels within normal limits.   Pregnancy test is negative.   Patient was medically cleared at the time of tts evaluation.    Collateral received from patient's mother Antonio [name]: His mother states she is concerned that her son will kill himself and she does not know what to do to help him. States her son was dx with dissociative identity disorder and his alters are in conflict, one thinks he;s worthless and wants to die, while th other wants to live.  She is unsure of how many alters but is aware of 2 of them.  She reports she's been working with outpatient SW Normajean Glasgow, with [facility]- for the past 3 years;  he's currently enrolled in DBT group session with [facility] every Monday.  She reports patient has a lot services put in place but she does not believe they've been helpful, "and I feel like I'm chasing my tail and trying to stop him from killing myself."  She reports patient lives at home with her and his step  father.  She reports patient has not had intensive in home services because his insurance will not pay for it.  Pt is 17 years old but still under his father's insurance.  She understands that patient is being admitted to [facility] for acute crisis stabilization but states it would be ideal if her son could transition from inpatient to one of the above PRTFs.    As of May 28th, Pt is currently on the 2-3 week wait-list for [facility] in [place]; and   [facility]- pt is also on  the 2-3 week wait list for this facility in [place].  She reports patient already had the interview and said he would like to defer acceptance until he graduated and received his diploma on June 8th.    During evaluation Antonio [name] is seated at the edge of the bed in an exam room; He is alert/oriented x 4; appears nervous but cooperative; pt smiling during assessment which is incongruent with his depressive symptoms.  Patient is speaking in a clear tone at moderate volume, and normal pace; with good eye contact.  His thought process is goal oriented;  but has illogical thought content as he ruminates on his desire to end his life.  There is no indication that he is currently responding to internal/external stimuli or experiencing delusional thought content.  Patient admits to suicidal ideation and prior suicide attempt by cutting his wrist.  He denies homicidal ideation, psychosis, and paranoia.  Patient has remained cooperative throughout assessment and has answered questions appropriately.    Per ED Provider Admission Assessment 02/09/2023@0259 : Chief Complaint  Patient presents with   Suicidal      Self inflicted wrist lacerations      Antonio [name] is a 17 y.o. adult.   The history is provided by the patient and medical records.    17 year old transgender male to male, presenting to the ED with suicidal ideation and self-inflicted wounds to left wrist.  Reports she did this about an hour and a half prior to arrival with blade from a pencil sharpener.  Does report intended self-harm but denies suicidal thoughts currently.  Denies any homicidal ideation or hallucinations.  Tetanus UTD.   Of note, patient seen in the ED for similar in February and April 2024.     Past Psychiatric History:   Per chart review, pt was seen by Dr. Lorenso Quarry Psychiatrist 01/27/2023 via video: "His psychiatric history significant of history of social anxiety disorder, major depressive  disorder. He has hx of 6 psychiatric hospitalizations  in the context of suicide attempt by cutting, by OD on Wellbutrin XL 150 mg x 6 tablets, at [facility] between 06/03 to 06/12 for aggressive behaviors at therapist office and suicidal thoughts, at [facility] between 07/21 to 07/30 for OD on Ibuprofen 600 mg in a span of about three months. At [facility] for about a week in 10/2021 for cutting and then swallowing the blade. He was admitted at [facility] in 04/2022 after cutting self on the left wrist deep that required sutures on subcutaneous fat layer and 9 sutures on the skin and last at [facility] for a week in 07/2022 for suicide attempt via cutting self deep enough that required 6 stitches.  He was admitted to [facility] for 3 days in December 2023 in the context of suicidal thoughts secondary to death of his dog who lived with him for 14 years. He was again admitted in  12/2022 for cutting self in the context of suicidal thoughts, cutting was deep enough that he required 4 stitches."  Risk to Self:  yes Risk to Others:  denies Prior Inpatient Therapy:  yes Prior Outpatient Therapy:  yes  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   Asthma    Bupropion overdose 01/16/2020   Complication of anesthesia    gets disoriented and shakes after surgery   Deliberate self-cutting 10/17/2018   Depression    Eczema    Fracture of 5th metatarsal    Gender dysphoria    Intentional overdose of drug in tablet form (HCC) 04/03/2020   MDD (major depressive disorder), recurrent severe, without psychosis (HCC) 11/02/2021   Moderate episode of recurrent major depressive disorder (HCC) 04/24/2020   Multiple allergies    Severe episode of recurrent major depressive disorder, without psychotic features (HCC) 01/02/2020   Suicidal ideations 08/31/2022   Suicide attempt by cutting of wrist (HCC) 07/28/2022    Past  Surgical History:  Procedure Laterality Date   SUPPRELIN IMPLANT Left 01/22/2019   Procedure: SUPPRELIN IMPLANT;  Surgeon: Kandice Hams, MD;  Location: [facility];  Service: Pediatrics;  Laterality: Left;   SUPPRELIN REMOVAL N/A 03/03/2020   Procedure: SUPPRELIN REMOVAL;  Surgeon: Kandice Hams, MD;  Location: [facility];  Service: Pediatrics;  Laterality: N/A;   TONSILLECTOMY     TYMPANOSTOMY TUBE PLACEMENT     Family History:  Family History  Problem Relation Age of Onset   Depression Mother    Anxiety disorder Mother    Hypertension Maternal Grandmother    Allergic rhinitis Neg Hx    Angioedema Neg Hx    Atopy Neg Hx    Eczema Neg Hx    Immunodeficiency Neg Hx    Urticaria Neg Hx    Family Psychiatric  History: deferred Social History:  Social History   Substance and Sexual Activity  Alcohol Use No     Social History   Substance and Sexual Activity  Drug Use Yes   Types: Marijuana    Social History   Socioeconomic History   Marital status: Single    Spouse name: Not on file   Number of children: Not on file   Years of education: Not on file   Highest education level: Not on file  Occupational History   Not on file  Tobacco Use   Smoking status: Never   Smokeless tobacco: Never  Vaping Use   Vaping Use: Former   Substances: Nicotine, Flavoring   Devices: Nicotine  Substance and Sexual Activity   Alcohol use: No   Drug use: Yes    Types: Marijuana   Sexual activity: Not Currently  Other Topics Concern   Not on file  Social History Narrative   Lives with mom and stepdad.      He is in 12th grade at [employer] 23-24 school year      He enjoys writing and listen to music   Social Determinants of Health   Financial Resource Strain: Not on file  Food Insecurity: Not on file  Transportation Needs: Not on file  Physical Activity: Not on file  Stress: Not on file  Social Connections: Not on file    Additional Social History:    Allergies:   Allergies  Allergen Reactions   Egg-Derived Products Anaphylaxis   Justicia Adhatoda Anaphylaxis and  Other (See Comments)    All "TREE NUTS"   Other Anaphylaxis and Other (See Comments)    Tree Nuts   Peanut-Containing Drug Products Anaphylaxis    Labs:  Results for orders placed or performed during the hospital encounter of 02/09/23 (from the past 48 hour(s))  I-Stat beta hCG blood, ED     Status: None   Collection Time: 02/09/23  3:30 AM  Result Value Ref Range   I-stat hCG, quantitative <5.0 <5 mIU/mL   Comment 3            Comment:   GEST. AGE      CONC.  (mIU/mL)   <=1 WEEK        5 - 50     2 WEEKS       50 - 500     3 WEEKS       100 - 10,000     4 WEEKS     1,000 - 30,000        MALE AND NON-PREGNANT MALE:     LESS THAN 5 mIU/mL   CBC     Status: Abnormal   Collection Time: 02/09/23  4:08 AM  Result Value Ref Range   WBC 5.4 4.0 - 10.5 K/uL   RBC 4.85 3.87 - 5.11 MIL/uL   Hemoglobin 12.1 12.0 - 15.0 g/dL   HCT 16.1 09.6 - 04.5 %   MCV 77.7 (L) 80.0 - 100.0 fL   MCH 24.9 (L) 26.0 - 34.0 pg   MCHC 32.1 30.0 - 36.0 g/dL   RDW 40.9 81.1 - 91.4 %   Platelets 235 150 - 400 K/uL   nRBC 0.0 0.0 - 0.2 %    Comment: Performed at [facility], [address] [place], [place] [address]  Comprehensive metabolic panel     Status: Abnormal   Collection Time: 02/09/23  6:02 AM  Result Value Ref Range   Sodium 137 135 - 145 mmol/L   Potassium 4.0 3.5 - 5.1 mmol/L   Chloride 104 98 - 111 mmol/L   CO2 25 22 - 32 mmol/L   Glucose, Bld 95 70 - 99 mg/dL    Comment: Glucose reference range applies only to samples taken after fasting for at least 8 hours.   BUN 11 6 - 20 mg/dL   Creatinine, Ser 6.21 (H) 0.44 - 1.00 mg/dL   Calcium 9.3 8.9 - 30.8 mg/dL   Total Protein 7.1 6.5 - 8.1 g/dL   Albumin 3.5 3.5 - 5.0 g/dL   AST 18 15 - 41 U/L   ALT 21 0 - 44 U/L   Alkaline Phosphatase 76 38 - 126 U/L   Total Bilirubin 0.2  (L) 0.3 - 1.2 mg/dL   GFR, Estimated >65 >78 mL/min    Comment: (NOTE) Calculated using the CKD-EPI Creatinine Equation (2021)    Anion gap 8 5 - 15    Comment: Performed at [facility], [address], [place], [place] [address]  Acetaminophen level     Status: Abnormal   Collection Time: 02/09/23  6:02 AM  Result Value Ref Range   Acetaminophen (Tylenol), Serum <10 (L) 10 - 30 ug/mL    Comment: (NOTE) Therapeutic concentrations vary significantly. A range of 10-30 ug/mL  may be an effective concentration for many patients. However, some  are best treated at concentrations outside of this range. Acetaminophen concentrations >150 ug/mL at 4 hours after ingestion  and >50 ug/mL at 12 hours after  ingestion are often associated with  toxic reactions.  Performed at [facility], [address] [place], [place] [address]   Ethanol     Status: None   Collection Time: 02/09/23  6:02 AM  Result Value Ref Range   Alcohol, Ethyl (B) <10 <10 mg/dL    Comment: (NOTE) Lowest detectable limit for serum alcohol is 10 mg/dL.  For medical purposes only. Performed at [facility], [address] [place], [place] [address]   Salicylate level     Status: Abnormal   Collection Time: 02/09/23  6:02 AM  Result Value Ref Range   Salicylate Lvl <7.0 (L) 7.0 - 30.0 mg/dL    Comment: Performed at [facility], [address] [place], [place] [address]  Rapid urine drug screen (hospital performed)     Status: None   Collection Time: 02/09/23  8:19 AM  Result Value Ref Range   Opiates NONE DETECTED NONE DETECTED   Cocaine NONE DETECTED NONE DETECTED   Benzodiazepines NONE DETECTED NONE DETECTED   Amphetamines NONE DETECTED NONE DETECTED   Tetrahydrocannabinol NONE DETECTED NONE DETECTED   Barbiturates NONE DETECTED NONE DETECTED    Comment: (NOTE) DRUG SCREEN FOR MEDICAL PURPOSES ONLY.  IF CONFIRMATION IS NEEDED FOR ANY PURPOSE, NOTIFY LAB WITHIN 5 DAYS.  LOWEST  DETECTABLE LIMITS FOR URINE DRUG SCREEN Drug Class                     Cutoff (ng/mL) Amphetamine and metabolites    1000 Barbiturate and metabolites    200 Benzodiazepine                 200 Opiates and metabolites        300 Cocaine and metabolites        300 THC                            50 Performed at [facility], [address], [place], [place] [address]     Medications:  No current facility-administered medications for this encounter.   Current Outpatient Medications  Medication Sig Dispense Refill   albuterol (VENTOLIN HFA) 108 (90 Base) MCG/ACT inhaler Inhale 1-2 puffs into the lungs every 4 (four) hours as needed for wheezing or shortness of breath.     ARIPiprazole (ABILIFY) 10 MG tablet Take 1 tablet (10 mg total) by mouth at bedtime. 30 tablet 0   busPIRone (BUSPAR) 10 MG tablet Take 1 tablet (10 mg total) by mouth 2 (two) times daily. 60 tablet 0   EPINEPHrine (EPIPEN 2-PAK) 0.3 mg/0.3 mL IJ SOAJ injection Inject 0.3 mg into the muscle as needed for anaphylaxis (AS DIRECTED).     fluticasone (FLONASE) 50 MCG/ACT nasal spray Place 1 spray into both nostrils 2 (two) times daily as needed for allergies or rhinitis.     naltrexone (DEPADE) 50 MG tablet Take 1 tablet (50 mg total) by mouth daily. 60 tablet 0   omeprazole (PRILOSEC) 40 MG capsule Take 40 mg by mouth daily before breakfast.     PATADAY 0.1 % ophthalmic solution Place 1 drop into both eyes 3 (three) times daily as needed for allergies.     propranolol (INDERAL) 10 MG tablet Take 1 tablet (10 mg total) by mouth at bedtime. 30 tablet 0   Testosterone Cypionate 200 MG/ML SOLN Inject 80 mg into the skin every Sunday. 4 mL 1  traZODone (DESYREL) 50 MG tablet Take 1 tablet (50 mg total) by mouth at bedtime as needed for sleep. 30 tablet 0   TYLENOL 500 MG tablet Take 500-1,000 mg by mouth every 6 (six) hours as needed for mild pain or headache.     venlafaxine XR (EFFEXOR-XR) 150 MG 24 hr capsule Take 1  capsule (150 mg total) by mouth daily with breakfast. 30 capsule 0   ZYRTEC ALLERGY 10 MG tablet Take 10 mg by mouth daily as needed for allergies or rhinitis.      Musculoskeletal:Pt moves all extremities and ambulates independently.  Strength & Muscle Tone: within normal limits Gait & Station: normal Patient leans: N/A   Psychiatric Specialty Exam:  Presentation  General Appearance:  Neat; Well Groomed  Eye Contact: Good  Speech: Clear and Coherent  Speech Volume: Normal  Handedness: Right   Mood and Affect  Mood: Anxious; Depressed  Affect: Inappropriate (pt smiling during assessment)   Thought Process  Thought Processes: Goal Directed  Descriptions of Associations:Intact  Orientation:Full (Time, Place and Person)  Thought Content:Illogical  History of Schizophrenia/Schizoaffective disorder:No data recorded Duration of Psychotic Symptoms:No data recorded Hallucinations:Hallucinations: None  Ideas of Reference:None  Suicidal Thoughts:Suicidal Thoughts: Yes, Active SI Active Intent and/or Plan: With Intent; With Plan; With Means to Carry Out; With Access to Means  Homicidal Thoughts:Homicidal Thoughts: No   Sensorium  Memory: Immediate Good; Recent Good; Remote Good  Judgment: Poor  Insight: Poor   Executive Functions  Concentration: Fair  Attention Span: Fair  Recall: Good  Fund of Knowledge: Good  Language: Good   Psychomotor Activity  Psychomotor Activity:Psychomotor Activity: Normal   Assets  Assets: Communication Skills; Housing; Social Support; Health and safety inspector; Vocational/Educational   Sleep  Sleep:Sleep: Fair Number of Hours of Sleep: 5    Physical Exam: Physical Exam Constitutional:      Appearance: Normal appearance.  Cardiovascular:     Rate and Rhythm: Normal rate.     Pulses: Normal pulses.  Pulmonary:     Effort: Pulmonary effort is normal.  Musculoskeletal:        General:  Normal range of motion.     Cervical back: Normal range of motion.  Neurological:     Mental Status: He is alert and oriented to person, place, and time. Mental status is at baseline.  Psychiatric:        Attention and Perception: Attention normal.        Mood and Affect: Mood is anxious and depressed. Affect is inappropriate (pt smiling during assessment, appears nervous).        Speech: Speech normal.        Behavior: Behavior is cooperative.        Thought Content: Thought content is not paranoid or delusional. Thought content includes suicidal ideation. Thought content does not include homicidal ideation. Thought content includes suicidal plan. Thought content does not include homicidal plan.        Cognition and Memory: Cognition and memory normal.        Judgment: Judgment is impulsive and inappropriate.    ROS Blood pressure 138/85, pulse 95, temperature 98.6 F (37 C), temperature source Oral, resp. rate 18, SpO2 96 %. There is no height or weight on file to calculate BMI.  Treatment Plan Summary: Pt voluntarily presents to the emergency department, unaccompanied with c/o cutting his wrist as a suicide attempt. Patient required 17 sutures to his left volar forearm/wrist.  On assessment today, he's alert and oriented x4 and admits  cutting his wrist was a suicide attempt.    Pt has hx for similar behaviors, was recently admitted April and February of this year with similar presentation.  Per chart review, with each attempt, patient's self inflicted lacerations are progressively worse.  Thus, at this point, concerned with the potential of patient completing suicide. He is followed by outpatient psychiatry, Dr. Audery Amel visit was May 16th.  Pt reports medication compliance.  However, Pt's  impulsivity and impaired judgement puts him at a heightened risk for suicide. Pt also demonstrates inability to utilize previously learned coping skills.   Considering this, he is referred for  inpatient psychiatric admission where he can be monitored for mood stability and safety and med adjustments as needed.  This was discussed with patient who agrees.  Spoke with patient's     Daily contact with patient to assess and evaluate symptoms and progress in treatment and Medication management  Dx: MDD, recurrent, severe without psychosis: Pt will resume home medications.   Disposition: Pt meets criteria for inpatient psychiatric admission.  I personally spent 1.5 minutes in direct patient care via telepsychiatry and telephone call to his mother.  The direct patient care time included face-to-face time with the patient, reviewing the patient's chart, communicating with other professionals, and gaining collateral from the patient's mother to coordinate care. Greater than 50% of this time was spent gaining collateral and providing verbal support to Ms. Antonio [name], patient's mother and discharging discharge needs/concerns.    Spoke with Claudette Stapler, PA-C; Rona Ravens, B[facility]-AC, [place], LCSW; Maren Reamer, RN informed of above recommendation and disposition.   This service was provided via telemedicine using a 2-way, interactive audio and video technology.  Names of all persons participating in this telemedicine service and their role in this encounter. Name: Elenore Rota Role: Patient  Name:[name] Role: Patient's mother  Name: Ophelia Shoulder Role: PMHNP  Name: Gretta Cool Role: Psychiatrist    Chales Abrahams, NP 02/09/2023 3:10 PM

## 2023-02-09 NOTE — Progress Notes (Signed)
Pt was accepted to [facility] TODAY 02/09/2023, pending signed voluntary consent faxed to [phone]. Bed assignment: 200-1  Pt meets inpatient criteria per Ophelia Shoulder, NP  Attending Physician will be Leata Mouse, MD  Report can be called to: - Child and Adolescence unit: [phone]  Pt can arrive after end of shift change tonight  Care Team Notified: [facility] Rona Ravens, RN, Claudette Stapler, PA-C, Maren Reamer, RN, Ophelia Shoulder, NP, and Clinton Gallant, RN  [place], [place]  02/09/2023 4:07 PM

## 2023-02-09 NOTE — ED Notes (Signed)
Pt is voluntary. 

## 2023-02-09 NOTE — ED Notes (Signed)
Shift report received, assumed care of patient at this time.  

## 2023-02-09 NOTE — ED Provider Notes (Signed)
Care assumed from Sharilyn Sites, PA-C at shift change pending medical clearance.  See her note for full HPI.  In short, patient is an 17 year old transgender male to male who presents to the ED due to suicidal ideations and self-inflicted wounds to left wrist.  Patient cut his left wrist with a pencil sharpener which was repaired by previous provider.  Plan from previous provider was to follow-up on labs and if normal patient is medically cleared for TTS evaluation.  Procedures  Procedures  ED Course / MDM    Medical Decision Making Amount and/or Complexity of Data Reviewed Labs: ordered. Decision-making details documented in ED Course.  Risk Prescription drug management.   7:29 AM CBC reassuring.  No leukocytosis.  Normal hemoglobin.  Acetaminophen, ethanol, and salicylate levels within normal limits.  CMP reassuring.  No major electrolyte derangements.  Pregnancy test negative.  Patient has been medically cleared for TTS evaluation.  Patient has not been IVC'd; however if he attempts to leave, would IVC patient.  The patient has been placed in psychiatric observation due to the need to provide a safe environment for the patient while obtaining psychiatric consultation and evaluation, as well as ongoing medical and medication management to treat the patient's condition.  The patient has not been placed under full IVC at this time.       Antonio Stabile, PA-C 02/09/23 0732    Antonio Grizzle, MD 02/10/23 [phone]

## 2023-02-09 NOTE — Tx Team (Signed)
Initial Treatment Plan 02/09/2023 11:18 PM Antonio [name] ZOX:[id number]    PATIENT STRESSORS: Educational concerns   Other: possible long term treatment facility     PATIENT STRENGTHS: Ability for insight  Average or above average intelligence  General fund of knowledge  Physical Health    PATIENT IDENTIFIED PROBLEMS: anxiety  Alteration in mood depressed                   DISCHARGE CRITERIA:  Ability to meet basic life and health needs Improved stabilization in mood, thinking, and/or behavior Need for constant or close observation no longer present Reduction of life-threatening or endangering symptoms to within safe limits  PRELIMINARY DISCHARGE PLAN: Outpatient therapy Return to previous living arrangement Return to previous work or school arrangements  PATIENT/FAMILY INVOLVEMENT: This treatment plan has been presented to and reviewed with the patient, Antonio [name], and/or family member, The patient and family have been given the opportunity to ask questions and make suggestions.  Cherene Altes, RN 02/09/2023, 11:18 PM

## 2023-02-09 NOTE — ED Notes (Signed)
Unable to find patients personal belongings. No documentation on chart, Charge Nurse [facility] notified.

## 2023-02-09 NOTE — ED Notes (Signed)
Patient's clothes/personal belongings given to mother .

## 2023-02-10 MED ORDER — TESTOSTERONE CYPIONATE 200 MG/ML IM SOLN
80.0000 mg | INTRAMUSCULAR | Status: DC
  Administered 2023-02-10: 80 mg via INTRAMUSCULAR

## 2023-02-10 MED ORDER — PANTOPRAZOLE SODIUM 40 MG PO TBEC
80.0000 mg | DELAYED_RELEASE_TABLET | Freq: Every day | ORAL | Status: DC
  Administered 2023-02-10 – 2023-02-14 (×5): 80 mg via ORAL
  Filled 2023-02-10 (×3): qty 2
  Filled 2023-02-10: qty 4
  Filled 2023-02-10 (×4): qty 2

## 2023-02-10 MED ORDER — OLOPATADINE HCL 0.1 % OP SOLN: 1.0000 [drp] | Freq: Three times a day (TID) | OPHTHALMIC | Status: DC | PRN

## 2023-02-10 MED ORDER — TESTOSTERONE CYPIONATE 100 MG/ML IM SOLN: 100.0000 mg | INTRAMUSCULAR | Status: DC

## 2023-02-10 MED ORDER — TESTOSTERONE CYPIONATE 200 MG/ML IJ SOLN: 80.0000 mg | INTRAMUSCULAR | Status: DC

## 2023-02-10 NOTE — Group Note (Signed)
LCSW Group Therapy Note   roup Date: 02/10/2023 Start Time: 1430 End Time: 1530   Type of Therapy and Topic:  Group Therapy: How Anxiety Affects Me  Participation Level:  Active   Description of Group:   Patients participated in an activity that focuses on how anxiety affects different areas of our lives; thoughts, emotional, physical, behavioral, and social interactions. Participants were asked to list different ways anxiety manifests and affects each domain and to provide specific examples. Patients were then asked to discuss the coping skills they currently use to deal with anxiety and to discuss potential coping strategies.    Therapeutic Goals: 1. Patients will differentiate between each domain and learn that anxiety can affect each area in different ways.  2. Patients will specify how anxiety has affected each area for them personally.  3. Patients will discuss coping strategies and brainstorm new ones.   Summary of Patient Progress:  Patient discussed other ways in which they are affected by anxiety, and how they cope with it. Patient proved open to feedback from CSW and peers. Patient demonstrated good insight into the subject matter, was respectful of peers, and was present throughout the entire session.  Therapeutic Modalities:   Cognitive Behavioral Therapy,  Solution-Focused Therapy

## 2023-02-10 NOTE — [facility] Group Notes (Signed)
Spiritual care group on grief and loss facilitated by Chaplain Dyanne Carrel, [facility]  Group Goal: Support / Education around grief and loss  Members engage in facilitated group support and psycho-social education.  Group Description:  Following introductions and group rules, group members engaged in facilitated group dialogue and support around topic of loss, with particular support around experiences of loss in their lives. Group Identified types of loss (relationships / self / things) and identified patterns, circumstances, and changes that precipitate losses. Reflected on thoughts / feelings around loss, normalized grief responses, and recognized variety in grief experience. Group encouraged individual reflection on safe space and on the coping skills that they are already utilizing.  Group drew on Adlerian / Rogerian and narrative framework  Patient Progress: Antonio [name] attended group but did not participate. He has been in this group on previous admissions and was given permission to sit out from group if he wanted.

## 2023-02-10 NOTE — Plan of Care (Signed)
  Problem: Education: Goal: Utilization of techniques to improve thought processes will improve Outcome: Progressing Goal: Knowledge of the prescribed therapeutic regimen will improve Outcome: Progressing   Problem: Activity: Goal: Interest or engagement in leisure activities will improve Outcome: Progressing Goal: Imbalance in normal sleep/wake cycle will improve Outcome: Progressing   Problem: Coping: Goal: Coping ability will improve Outcome: Progressing Goal: Will verbalize feelings Outcome: Progressing   Problem: Health Behavior/Discharge Planning: Goal: Ability to make decisions will improve Outcome: Progressing Goal: Compliance with therapeutic regimen will improve Outcome: Progressing   Problem: Role Relationship: Goal: Will demonstrate positive changes in social behaviors and relationships Outcome: Progressing   Problem: Safety: Goal: Ability to disclose and discuss suicidal ideas will improve Outcome: Progressing Goal: Ability to identify and utilize support systems that promote safety will improve Outcome: Progressing   Problem: Self-Concept: Goal: Will verbalize positive feelings about self Outcome: Progressing Goal: Level of anxiety will decrease Outcome: Progressing   Problem: Education: Goal: Knowledge of [facility] Education information/materials will improve Outcome: Progressing Goal: Emotional status will improve Outcome: Progressing Goal: Mental status will improve Outcome: Progressing Goal: Verbalization of understanding the information provided will improve Outcome: Progressing   Problem: Activity: Goal: Interest or engagement in activities will improve Outcome: Progressing Goal: Sleeping patterns will improve Outcome: Progressing   Problem: Coping: Goal: Ability to verbalize frustrations and anger appropriately will improve Outcome: Progressing Goal: Ability to demonstrate self-control will improve Outcome: Progressing    Problem: Health Behavior/Discharge Planning: Goal: Identification of resources available to assist in meeting health care needs will improve Outcome: Progressing Goal: Compliance with treatment plan for underlying cause of condition will improve Outcome: Progressing   Problem: Physical Regulation: Goal: Ability to maintain clinical measurements within normal limits will improve Outcome: Progressing   Problem: Safety: Goal: Periods of time without injury will increase Outcome: Progressing   Problem: Education: Goal: Ability to make informed decisions regarding treatment will improve Outcome: Progressing   Problem: Coping: Goal: Coping ability will improve Outcome: Progressing   Problem: Health Behavior/Discharge Planning: Goal: Identification of resources available to assist in meeting health care needs will improve Outcome: Progressing   Problem: Medication: Goal: Compliance with prescribed medication regimen will improve Outcome: Progressing   Problem: Self-Concept: Goal: Ability to disclose and discuss suicidal ideas will improve Outcome: Progressing Goal: Will verbalize positive feelings about self Outcome: Progressing   

## 2023-02-10 NOTE — [facility] Group Notes (Signed)
[facility] Group Notes:  (Nursing/MHT/Case Management/Adjunct)  Date:  02/10/2023  Time:  10:52 AM  Type of Therapy:  Group Topic/ Focus: Goals Group: The focus of this group is to help patients establish daily goals to achieve during treatment and discuss how the patient can incorporate goal setting into their daily lives to aide in recovery.   Participation Level:  Active  Participation Quality:  Appropriate  Affect:  Appropriate  Cognitive:  Appropriate  Insight:  Appropriate  Engagement in Group:  Engaged  Modes of Intervention:  Discussion  Summary of Progress/Problems:  Patient attended and participated goals group today. No SI/HI. Patient's goal for today is to work on communication.   Daneil Dan 02/10/2023, 10:52 AM

## 2023-02-10 NOTE — H&P (Signed)
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   Orientation:Full (Time, Place and Person)   Thought Content:Illogical   History of Schizophrenia/Schizoaffective disorder:No data recorded  Duration of Psychotic Symptoms:No data recorded Hallucinations:Hallucinations: None   Ideas of Reference:None   Suicidal Thoughts:Suicidal Thoughts: Yes, Active SI Active Intent and/or Plan: With Intent; With Plan; With Means to Carry Out; With Access to Means   Homicidal Thoughts:Homicidal Thoughts: No    Sensorium  Memory: Immediate Good; Recent Good; Remote Good   Judgment: Poor   Insight: Poor    Executive Functions  Concentration: Fair   Attention Span: Fair   Recall: Good   Fund of  Knowledge:  Good   Language: Good    Psychomotor Activity  Psychomotor Activity: Psychomotor Activity: Normal    Assets  Assets: Communication Skills; Housing; Social Support; Health and safety inspector; Vocational/Educational    Sleep  Sleep: Sleep: Fair Number of Hours of Sleep: 5     Physical Exam: Physical Exam Vitals reviewed.  Constitutional:      General: He is not in acute distress.    Appearance: He is not toxic-appearing.  HENT:     Head: Normocephalic and atraumatic.     Comments: Multiple facial piercings    Mouth/Throat:     Mouth: Mucous membranes are moist.     Pharynx: Oropharynx is clear.  Pulmonary:     Effort: Pulmonary effort is normal.  Skin:    General: Skin is warm and dry.     Comments: Dressing changed by RN just prior to assessment. Dressing c/d/i  Neurological:     General: No focal deficit present.     Mental Status: He is alert and oriented to person, place, and time.     Gait: Gait normal.    Review of Systems  Gastrointestinal:  Positive for abdominal pain. Negative for constipation, diarrhea, heartburn, nausea and vomiting.  Genitourinary: Negative.   Musculoskeletal:  Negative for myalgias.  Neurological:  Negative for dizziness, weakness and headaches.   Blood pressure 110/69, pulse 83, temperature (!) 97.4 F (36.3 C), resp. rate 16, height 5' 4.57" (1.64 m), weight 95.7 kg, SpO2 100 %. Body mass index is 35.58 kg/m.   Treatment Plan Summary: Patient was admitted to the Child and adolescent unit at [facility] under the service of Dr. Elsie Saas. Will maintain Q 15 minutes observation for safety.  Estimated LOS:  5-7 days Reviewed admission lab: CMP-WNL (Cr 1.08 but baseline 0.83-.94, so no AKI) , lipids-total cholesterol 200, LDL 137, CBC with differential-WNL, hemoglobin A1c WNL on 4/18, urine pregnancy test negative and TSH is WNL on 4/18, urine tox screen nondetected.  EKG 12-lead-NSR.   During this hospitalization the patient will receive psychosocial and education assessment Medication Management:  Social anxiety disorder, mood disorder-continue Effexor XR 150 mg, BuSpar 10 mg twice daily, Abilify 10 mg nightly, propranolol 10 mg nightly.  PRNs: Hydroxyzine 25 mg every 6 hours for anxiety, trazodone 50 to 100 mg nightly for sleep, and Seroquel 25 mg every 6 hours for agitation/anxiety Chronic SI/suicide attempts-continue naltrexone 50 mg daily Gender dysphoria-testosterone cypionate 80 mg IM q. Wednesday GERD-Protonix 80 mg daily Seasonal allergies-Claritin 10 mg daily Patient will participate in  group, milieu, and family therapy. Psychotherapy:  Social and Doctor, hospital, anti-bullying, learning based strategies, cognitive behavioral, and family object relations individuation separation intervention psychotherapies can be considered. Patient and guardian were educated about medication efficacy and side effects.  Patient not agreeable with medication trial will speak with guardian.  Will continue to monitor patient's mood and behavior. To schedule a Family meeting to obtain collateral information and discuss discharge and follow up plan.  Physician Treatment Plan for Primary Diagnosis: Mood disorder (HCC) Long Term Goal(s): Improvement in symptoms so as ready for discharge  Short Term Goals: Ability to identify changes in lifestyle to reduce recurrence of condition will improve, Ability to verbalize feelings will improve, Ability to disclose and discuss suicidal ideas, Ability to demonstrate self-control will improve, Ability to identify and develop effective coping behaviors will improve, Ability to maintain clinical measurements within normal limits will improve, and Compliance with prescribed medications will improve  Physician Treatment Plan for  Secondary Diagnosis: Principal Problem:   Mood disorder (HCC) Active Problems:   Gender dysphoria   Deliberate  self-cutting   Social anxiety disorder   Gastroesophageal reflux disease   Autism spectrum disorder requiring support (level 1)   Suicidal ideations   Long Term Goal(s): Improvement in symptoms so as ready for discharge  Short Term Goals: Ability to identify changes in lifestyle to reduce recurrence of condition will improve, Ability to verbalize feelings will improve, Ability to disclose and discuss suicidal ideas, Ability to demonstrate self-control will improve, Ability to identify and develop effective coping behaviors will improve, Ability to maintain clinical measurements within normal limits will improve, and Compliance with prescribed medications will improve  I certify that inpatient services furnished can reasonably be expected to improve the patient's condition.    Lamar Sprinkles, MD 5/30/20244:45 PM

## 2023-02-10 NOTE — Progress Notes (Addendum)
This is 6th [facility] inpt admission for this 17yo transgender male to male, he/him pronouns, goes by "TJ." Pt admitted from [facility] ED with SI after cutting self on left forearm, with a pencil sharpener, requiring 17 stitches. Pt reports his main stressor is graduating school and he is also currently on a 2-3wk waiting list for [facility] in [place]. Pt states that he was dx with OSDD and has two different personalities that are in conflict of living or dying. Pt states that he is currently enrolled in DBT group sessions every Monday. Pt lives at home with mother and stepfather. Pt on Testerone weekly x81yrs. Pt feels worthless and doesn't believe any services he has received are helpful to him. Currently denies SI/HI or hallucinations, dressing changed, no s/s of infection (a) 15 min checks (r) safety maintained.

## 2023-02-11 ENCOUNTER — Encounter (HOSPITAL_COMMUNITY): Payer: Self-pay

## 2023-02-11 MED ORDER — ARIPIPRAZOLE 5 MG PO TABS: 12.5000 mg | ORAL_TABLET | Freq: Every day | ORAL | Status: DC

## 2023-02-11 MED ORDER — ARIPIPRAZOLE 15 MG PO TABS
15.0000 mg | ORAL_TABLET | Freq: Every day | ORAL | Status: DC
  Administered 2023-02-11 – 2023-02-13 (×3): 15 mg via ORAL
  Filled 2023-02-11 (×5): qty 1

## 2023-02-11 NOTE — Progress Notes (Signed)
Chaplain met with Antonio [name] to check in after multiple admissions.  Antonio [name] was in good spirits and feels glad to be safe in the hospital.  He feels well supported at home, but knows there are many things he could hurt himself with.  He is hoping to leave by June 5 so he can walk at graduation.  He plans to spend the next year focused on his mental health and feels hopeful that one of the residential treatment facilities that his mom has been researching will be helpful to him.  He was appreciative of the check in, but did not feel the need to talk further.

## 2023-02-11 NOTE — BH IP Treatment Plan (Signed)
Interdisciplinary Treatment and Diagnostic Plan Update  02/11/2023 Time of Session: 10:55am Antonio [name] MRN: [id number]  Principal Diagnosis: Deliberate self-cutting  Secondary Diagnoses: Principal Problem:   Deliberate self-cutting Active Problems:   Gender dysphoria   Social anxiety disorder   Gastroesophageal reflux disease   MDD (major depressive disorder), recurrent severe, without psychosis (HCC)   Suicidal ideations   Current Medications:  Current Facility-Administered Medications  Medication Dose Route Frequency Provider Last Rate Last Admin   acetaminophen (TYLENOL) tablet 500-1,000 mg  500-1,000 mg Oral Q6H PRN Sindy Guadeloupe, NP       albuterol (VENTOLIN HFA) 108 (90 Base) MCG/ACT inhaler 1-2 puff  1-2 puff Inhalation Q4H PRN Sindy Guadeloupe, NP       alum & mag hydroxide-simeth (MAALOX/MYLANTA) 200-200-20 MG/5ML suspension 30 mL  30 mL Oral Q6H PRN Sindy Guadeloupe, NP       ARIPiprazole (ABILIFY) tablet 15 mg  15 mg Oral QHS Lamar Sprinkles, MD       busPIRone (BUSPAR) tablet 10 mg  10 mg Oral BID Sindy Guadeloupe, NP   10 mg at 02/11/23 0810   EPINEPHrine (EPI-PEN) injection 0.3 mg  0.3 mg Intramuscular PRN Sindy Guadeloupe, NP       fluticasone (FLONASE) 50 MCG/ACT nasal spray 1 spray  1 spray Each Nare BID PRN Sindy Guadeloupe, NP       loratadine (CLARITIN) tablet 10 mg  10 mg Oral Daily Sindy Guadeloupe, NP   10 mg at 02/11/23 0810   OLANZapine zydis (ZYPREXA) disintegrating tablet 5 mg  5 mg Oral Q8H PRN Sindy Guadeloupe, NP       And   LORazepam (ATIVAN) tablet 1 mg  1 mg Oral PRN Sindy Guadeloupe, NP       And   ziprasidone (GEODON) injection 20 mg  20 mg Intramuscular PRN Sindy Guadeloupe, NP       naltrexone (DEPADE) tablet 50 mg  50 mg Oral Daily Sindy Guadeloupe, NP   50 mg at 02/11/23 0810   olopatadine (PATANOL) 0.1 % ophthalmic solution 1 drop  1 drop Both Eyes TID PRN Lamar Sprinkles, MD       pantoprazole (PROTONIX) EC tablet 80 mg  80 mg Oral Daily Lamar Sprinkles, MD   80 mg  at 02/11/23 0810   propranolol (INDERAL) tablet 10 mg  10 mg Oral Dorthey Sawyer, NP   10 mg at 02/10/23 2056   testosterone cypionate (DEPOTESTOSTERONE CYPIONATE) injection 80 mg  80 mg Intramuscular Q Teddy Spike, MD   80 mg at 02/10/23 1130   traZODone (DESYREL) tablet 50 mg  50 mg Oral QHS PRN Sindy Guadeloupe, NP       venlafaxine XR (EFFEXOR-XR) 24 hr capsule 150 mg  150 mg Oral Q breakfast Sindy Guadeloupe, NP   150 mg at 02/11/23 4098   PTA Medications: Medications Prior to Admission  Medication Sig Dispense Refill Last Dose   albuterol (VENTOLIN HFA) 108 (90 Base) MCG/ACT inhaler Inhale 1-2 puffs into the lungs every 4 (four) hours as needed for wheezing or shortness of breath.      ARIPiprazole (ABILIFY) 10 MG tablet Take 1 tablet (10 mg total) by mouth at bedtime. 30 tablet 0    busPIRone (BUSPAR) 10 MG tablet Take 1 tablet (10 mg total) by mouth 2 (two) times daily. 60 tablet 0    EPINEPHrine (EPIPEN 2-PAK) 0.3 mg/0.3 mL IJ SOAJ injection Inject 0.3 mg into the muscle as needed for anaphylaxis (AS DIRECTED).  fluticasone (FLONASE) 50 MCG/ACT nasal spray Place 1 spray into both nostrils 2 (two) times daily as needed for allergies or rhinitis.      naltrexone (DEPADE) 50 MG tablet Take 1 tablet (50 mg total) by mouth daily. 60 tablet 0    omeprazole (PRILOSEC) 40 MG capsule Take 40 mg by mouth daily before breakfast.      PATADAY 0.1 % ophthalmic solution Place 1 drop into both eyes 3 (three) times daily as needed for allergies.      propranolol (INDERAL) 10 MG tablet Take 1 tablet (10 mg total) by mouth at bedtime. 30 tablet 0    Testosterone Cypionate 200 MG/ML SOLN Inject 80 mg into the skin every Sunday. (Patient taking differently: Inject 80 mg into the skin every Wednesday.) 4 mL 1    traZODone (DESYREL) 50 MG tablet Take 1 tablet (50 mg total) by mouth at bedtime as needed for sleep. 30 tablet 0    venlafaxine XR (EFFEXOR-XR) 150 MG 24 hr capsule Take 1 capsule  (150 mg total) by mouth daily with breakfast. 30 capsule 0     Patient Stressors: Educational concerns   Other: possible long term treatment facility    Patient Strengths: Ability for insight  Average or above average intelligence  General fund of knowledge  Physical Health   Treatment Modalities: Medication Management, Group therapy, Case management,  1 to 1 session with clinician, Psychoeducation, Recreational therapy.   Physician Treatment Plan for Primary Diagnosis: Deliberate self-cutting Long Term Goal(s): Improvement in symptoms so as ready for discharge   Short Term Goals: Ability to identify changes in lifestyle to reduce recurrence of condition will improve Ability to verbalize feelings will improve Ability to disclose and discuss suicidal ideas Ability to demonstrate self-control will improve Ability to identify and develop effective coping behaviors will improve Ability to maintain clinical measurements within normal limits will improve Compliance with prescribed medications will improve  Medication Management: Evaluate patient's response, side effects, and tolerance of medication regimen.  Therapeutic Interventions: 1 to 1 sessions, Unit Group sessions and Medication administration.  Evaluation of Outcomes: Not Progressing  Physician Treatment Plan for Secondary Diagnosis: Principal Problem:   Deliberate self-cutting Active Problems:   Gender dysphoria   Social anxiety disorder   Gastroesophageal reflux disease   MDD (major depressive disorder), recurrent severe, without psychosis (HCC)   Suicidal ideations  Long Term Goal(s): Improvement in symptoms so as ready for discharge   Short Term Goals: Ability to identify changes in lifestyle to reduce recurrence of condition will improve Ability to verbalize feelings will improve Ability to disclose and discuss suicidal ideas Ability to demonstrate self-control will improve Ability to identify and develop  effective coping behaviors will improve Ability to maintain clinical measurements within normal limits will improve Compliance with prescribed medications will improve     Medication Management: Evaluate patient's response, side effects, and tolerance of medication regimen.  Therapeutic Interventions: 1 to 1 sessions, Unit Group sessions and Medication administration.  Evaluation of Outcomes: Not Progressing   RN Treatment Plan for Primary Diagnosis: Deliberate self-cutting Long Term Goal(s): Knowledge of disease and therapeutic regimen to maintain health will improve  Short Term Goals: Ability to remain free from injury will improve, Ability to verbalize frustration and anger appropriately will improve, Ability to demonstrate self-control, Ability to participate in decision making will improve, Ability to verbalize feelings will improve, Ability to disclose and discuss suicidal ideas, Ability to identify and develop effective coping behaviors will improve, and Compliance with  prescribed medications will improve  Medication Management: RN will administer medications as ordered by provider, will assess and evaluate patient's response and provide education to patient for prescribed medication. RN will report any adverse and/or side effects to prescribing provider.  Therapeutic Interventions: 1 on 1 counseling sessions, Psychoeducation, Medication administration, Evaluate responses to treatment, Monitor vital signs and CBGs as ordered, Perform/monitor CIWA, COWS, AIMS and Fall Risk screenings as ordered, Perform wound care treatments as ordered.  Evaluation of Outcomes: Not Progressing   LCSW Treatment Plan for Primary Diagnosis: Deliberate self-cutting Long Term Goal(s): Safe transition to appropriate next level of care at discharge, Engage patient in therapeutic group addressing interpersonal concerns.  Short Term Goals: Engage patient in aftercare planning with referrals and resources,  Increase social support, Increase ability to appropriately verbalize feelings, Increase emotional regulation, and Increase skills for wellness and recovery  Therapeutic Interventions: Assess for all discharge needs, 1 to 1 time with Social worker, Explore available resources and support systems, Assess for adequacy in community support network, Educate family and significant other(s) on suicide prevention, Complete Psychosocial Assessment, Interpersonal group therapy.  Evaluation of Outcomes: Not Progressing   Progress in Treatment: Attending groups: Yes. Participating in groups: Yes. Taking medication as prescribed: Yes. Toleration medication: Yes. Family/Significant other contact made: Yes, individual(s) contacted:  [name], mother, [phone] Patient understands diagnosis: Yes. Discussing patient identified problems/goals with staff: Yes. Medical problems stabilized or resolved: Yes. Denies suicidal/homicidal ideation: Yes. Issues/concerns per patient self-inventory: No. Other: n/a  New problem(s) identified: No, Describe:  patient did not identify any new problems.   New Short Term/Long Term Goal(s): Safe transition to appropriate next level of care at discharge, engage patient in therapeutic group addressing interpersonal concerns.   Patient Goals:  " I want to work on communication with my parents about my mental health. Using coping skills more effectively".   Discharge Plan or Barriers: Safe transition to appropriate next level of care at discharge, engage patient in therapeutic group addressing interpersonal concerns.   Reason for Continuation of Hospitalization: Depression Suicidal ideation, Anxiety   Estimated Length of Stay: 5 to 7 days   Last 3 [place] Suicide Severity Risk Score: Flowsheet Row [facility] Most recent reading at 02/09/2023 10:56 PM ED from 02/09/2023 in [facility] Most recent reading at 02/09/2023  3:14 AM ED from 01/08/2023 in [facility] Emergency Department at [facility] Most recent reading at 01/08/2023  3:08 PM  C-SSRS RISK CATEGORY High Risk High Risk High Risk       Last PHQ 2/9 Scores:    08/18/2022   11:30 AM 05/28/2019   11:30 AM 10/17/2018    9:49 AM  Depression screen PHQ 2/9  Decreased Interest 1 1 3   Down, Depressed, Hopeless 1 2 2   PHQ - 2 Score 2 3 5   Altered sleeping 3 3 3   Tired, decreased energy 3 3 2   Change in appetite 1 2 2   Feeling bad or failure about yourself  1 2 3   Trouble concentrating 0 1 3  Moving slowly or fidgety/restless 0 1 1  Suicidal thoughts 1  2  PHQ-9 Score 11 15 21   Difficult doing work/chores Very difficult      Scribe for Treatment Team: Veva Holes, Theresia Majors 02/11/2023 9:59 AM

## 2023-02-11 NOTE — Progress Notes (Signed)
   02/11/23 0915  Psych Admission Type (Psych Patients Only)  Admission Status Voluntary  Psychosocial Assessment  Patient Complaints Anxiety;Depression  Eye Contact Fair  Facial Expression Flat  Affect Flat  Speech Logical/coherent;Soft  Interaction Assertive  Motor Activity Fidgety  Appearance/Hygiene Unremarkable  Behavior Characteristics Cooperative  Mood Depressed;Anxious  Thought Process  Coherency WDL  Content WDL  Delusions None reported or observed  Perception WDL  Hallucination None reported or observed  Judgment Limited  Confusion None  Danger to Self  Current suicidal ideation? Denies  Agreement Not to Harm Self Yes  Description of Agreement verbally contracts for safety  Danger to Others  Danger to Others None reported or observed
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
## 2023-02-11 NOTE — Progress Notes (Signed)
   02/10/23 2201  Psych Admission Type (Psych Patients Only)  Admission Status Voluntary  Psychosocial Assessment  Patient Complaints Sleep disturbance;Anxiety  Eye Contact Fair  Facial Expression Anxious  Affect Anxious  Speech Logical/coherent  Interaction Assertive  Motor Activity Fidgety  Appearance/Hygiene Unremarkable  Behavior Characteristics Cooperative  Mood Depressed;Anxious  Thought Process  Coherency WDL  Content WDL  Delusions WDL  Perception WDL  Hallucination None reported or observed  Judgment Limited  Confusion WDL  Danger to Self  Current suicidal ideation? Denies  Danger to Others  Danger to Others None reported or observed

## 2023-02-11 NOTE — [facility] Group Notes (Signed)
Type of Therapy: Group Topic/ Focus: Goals Group: The focus of this group is to help patients establish daily goals to achieve during treatment and discuss how the patient can incorporate goal setting into their daily lives to aide in recovery.    Participation Level:  Active   Participation Quality:  Appropriate   Affect:  Appropriate   Cognitive:  Appropriate   Insight:  Appropriate   Engagement in Group:  Engaged   Modes of Intervention:  Discussion   Summary of Progress/Problems:   Patient attended and participated goals group today. Patient's goal for today is to  work on communication with mom Patient is  not experiencing suicidal/ self harm thoughts today.

## 2023-02-11 NOTE — Plan of Care (Signed)
  Problem: Coping: Goal: Will verbalize feelings Outcome: Progressing   Problem: Health Behavior/Discharge Planning: Goal: Compliance with therapeutic regimen will improve Outcome: Progressing   Problem: Safety: Goal: Periods of time without injury will increase Outcome: Progressing   

## 2023-02-11 NOTE — Group Note (Signed)
Occupational Therapy Group Note  Group Topic:Coping Skills  Group Date: 02/11/2023 Start Time: 1430 End Time: 1510 Facilitators: Jassmin Kemmerer G, OT   Group Description: Group encouraged increased engagement and participation through discussion and activity focused on "Coping Ahead." Patients were split up into teams and selected a card from a stack of positive coping strategies. Patients were instructed to act out/charade the coping skill for other peers to guess and receive points for their team. Discussion followed with a focus on identifying additional positive coping strategies and patients shared how they were going to cope ahead over the weekend while continuing hospitalization stay.  Therapeutic Goal(s): Identify positive vs negative coping strategies. Identify coping skills to be used during hospitalization vs coping skills outside of hospital/at home Increase participation in therapeutic group environment and promote engagement in treatment   Participation Level: Engaged   Participation Quality: Independent   Behavior: Appropriate   Speech/Thought Process: Relevant   Affect/Mood: Appropriate   Insight: Fair   Judgement: Fair      Modes of Intervention: Education  Patient Response to Interventions:  Attentive   Plan: Continue to engage patient in OT groups 2 - 3x/week.  02/11/2023  Georgianne Gritz G Haydan Wedig, OT  Brogen Duell, OT  

## 2023-02-11 NOTE — Progress Notes (Signed)
[facility] MD Progress Note  02/11/2023 10:00 PM Antonio [name]  MRN:  [id number]   Subjective:   In Brief: Antonio [name] is an 17 year old transgender male on hormone therapy x 4 years, domiciled with biological mother and stepfather, 12th grader at [employer] with PPhx of social anxiety disorder, MDD, GAD, ASD who  was admitted voluntarily from  [facility] ED after lacerating arm with pencil sharpener blade; for which he has been admitted for the same multiple times in the past.  Staff RN reported patient has been participating well in the therapeutic milieu, compliant with medications, and has no concerns for safety.   Evaluation on the unit: Patient appeared calm, cooperative and pleasant.  Patient is also awake, alert oriented to time place person and situation.  Patient has normal psychomotor activity, good eye contact and normal rate, rhythm, and volume of speech.  Patient has been actively participating in therapeutic milieu, group activities, and learning coping skills to control emotional difficulties including depression, anxiety, and catastrophizing minor setbacks.  Patient rated depression 0-1/10, anxiety 0/10, anger 0/10, 10 being the highest severity.  The patient has no reported irritability, agitation or aggressive behavior.  Patient has been sleeping and eating well without any difficulties.  Patient contracts for safety while being in hospital and minimized current safety issues.  Patient has been taking medication, tolerating well without side effects of the medication including GI upset or mood activation.  Patient states goal today is to work on application of coping skills, affirming himself, and engaging in more positivity.    We again discuss the effects of catastrophizing minor issues and emphasis is placed on the reality of recent accomplishments. We discuss DBT principles of mindfulness, and grounding, and patient is encouraged to find a higher power/power  within for grounding. He is agreeable to explore this more.   Today, patient denies SI, thoughts of self-harm, HI, and AVH. He reports no problems with current medication regimen.    Principal Problem: Deliberate self-cutting Diagnosis: Principal Problem:   Deliberate self-cutting Active Problems:   Gender dysphoria   Social anxiety disorder   Gastroesophageal reflux disease   MDD (major depressive disorder), recurrent severe, without psychosis (HCC)   Suicidal ideations   Total Time spent with patient: 30 minutes  Past Psychiatric History: Reviewed from H&P and no changes  Past Medical History: Reviewed from H&P and no changes Past Medical History:  Diagnosis Date   Allergic rhinoconjunctivitis 06/06/2015   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   Asthma    Bupropion overdose 01/16/2020   Complication of anesthesia    gets disoriented and shakes after surgery   Deliberate self-cutting 10/17/2018   Depression    Eczema    Fracture of 5th metatarsal    Gender dysphoria    Intentional overdose of drug in tablet form (HCC) 04/03/2020   MDD (major depressive disorder), recurrent severe, without psychosis (HCC) 11/02/2021   Moderate episode of recurrent major depressive disorder (HCC) 04/24/2020   Multiple allergies    Severe episode of recurrent major depressive disorder, without psychotic features (HCC) 01/02/2020   Suicidal ideations 08/31/2022   Suicide attempt by cutting of wrist (HCC) 07/28/2022    Past Surgical History:  Procedure Laterality Date   SUPPRELIN IMPLANT Left 01/22/2019   Procedure: SUPPRELIN IMPLANT;  Surgeon: Kandice Hams, MD;  Location: [facility];  Service: Pediatrics;  Laterality: Left;   SUPPRELIN REMOVAL N/A 03/03/2020   Procedure: SUPPRELIN REMOVAL;  Surgeon: Kandice Hams, MD;  Location: [facility];  Service: Pediatrics;  Laterality: N/A;   TONSILLECTOMY     TYMPANOSTOMY TUBE PLACEMENT     Family  History:  Family History  Problem Relation Age of Onset   Depression Mother    Anxiety disorder Mother    Hypertension Maternal Grandmother    Allergic rhinitis Neg Hx    Angioedema Neg Hx    Atopy Neg Hx    Eczema Neg Hx    Immunodeficiency Neg Hx    Urticaria Neg Hx    Family Psychiatric  History: Reviewed from H&P and no changes Social History:  Social History   Substance and Sexual Activity  Alcohol Use No     Social History   Substance and Sexual Activity  Drug Use Not Currently   Types: Marijuana    Social History   Socioeconomic History   Marital status: Single    Spouse name: Not on file   Number of children: Not on file   Years of education: Not on file   Highest education level: Not on file  Occupational History   Not on file  Tobacco Use   Smoking status: Never   Smokeless tobacco: Never  Vaping Use   Vaping Use: Former   Substances: Nicotine, Flavoring   Devices: Nicotine  Substance and Sexual Activity   Alcohol use: No   Drug use: Not Currently    Types: Marijuana   Sexual activity: Not Currently  Other Topics Concern   Not on file  Social History Narrative   Lives with mom and stepdad.      He is in 12th grade at [employer] 23-24 school year      He enjoys writing and listen to music, writes poetry   Social Determinants of Health   Financial Resource Strain: Not on file  Food Insecurity: Not on file  Transportation Needs: Not on file  Physical Activity: Not on file  Stress: Not on file  Social Connections: Not on file   Additional Social History:                         Sleep: Good  Appetite:  Good  Current Medications: Current Facility-Administered Medications  Medication Dose Route Frequency Provider Last Rate Last Admin   acetaminophen (TYLENOL) tablet 500-1,000 mg  500-1,000 mg Oral Q6H PRN Sindy Guadeloupe, NP       albuterol (VENTOLIN HFA) 108 (90 Base) MCG/ACT inhaler 1-2 puff  1-2 puff Inhalation Q4H  PRN Sindy Guadeloupe, NP       alum & mag hydroxide-simeth (MAALOX/MYLANTA) 200-200-20 MG/5ML suspension 30 mL  30 mL Oral Q6H PRN Sindy Guadeloupe, NP       ARIPiprazole (ABILIFY) tablet 15 mg  15 mg Oral QHS Lamar Sprinkles, MD   15 mg at 02/11/23 2104   busPIRone (BUSPAR) tablet 10 mg  10 mg Oral BID Sindy Guadeloupe, NP   10 mg at 02/11/23 1738   EPINEPHrine (EPI-PEN) injection 0.3 mg  0.3 mg Intramuscular PRN Sindy Guadeloupe, NP       fluticasone (FLONASE) 50 MCG/ACT nasal spray 1 spray  1 spray Each Nare BID PRN Sindy Guadeloupe, NP       loratadine (CLARITIN) tablet 10 mg  10 mg Oral Daily Sindy Guadeloupe, NP   10 mg at 02/11/23 0810   OLANZapine zydis (ZYPREXA) disintegrating tablet 5 mg  5 mg Oral Q8H PRN Sindy Guadeloupe, NP  And   LORazepam (ATIVAN) tablet 1 mg  1 mg Oral PRN Sindy Guadeloupe, NP       And   ziprasidone (GEODON) injection 20 mg  20 mg Intramuscular PRN Sindy Guadeloupe, NP       naltrexone (DEPADE) tablet 50 mg  50 mg Oral Daily Sindy Guadeloupe, NP   50 mg at 02/11/23 0810   olopatadine (PATANOL) 0.1 % ophthalmic solution 1 drop  1 drop Both Eyes TID PRN Lamar Sprinkles, MD       pantoprazole (PROTONIX) EC tablet 80 mg  80 mg Oral Daily Lamar Sprinkles, MD   80 mg at 02/11/23 0810   propranolol (INDERAL) tablet 10 mg  10 mg Oral Dorthey Sawyer, NP   10 mg at 02/11/23 2103   testosterone cypionate (DEPOTESTOSTERONE CYPIONATE) injection 80 mg  80 mg Intramuscular Q Wed Jonnalagadda, Sharyne Peach, MD   80 mg at 02/10/23 1130   traZODone (DESYREL) tablet 50 mg  50 mg Oral QHS PRN Sindy Guadeloupe, NP       venlafaxine XR (EFFEXOR-XR) 24 hr capsule 150 mg  150 mg Oral Q breakfast Sindy Guadeloupe, NP   150 mg at 02/11/23 1610    Lab Results:  No results found for this or any previous visit (from the past 48 hour(s)).   Blood Alcohol level:  Lab Results  Component Value Date   ETH <10 02/09/2023   ETH <10 12/28/2022    Metabolic Disorder Labs: Lab Results  Component Value Date   HGBA1C  5.1 12/30/2022   MPG 99.67 12/30/2022   MPG 105 09/01/2022   Lab Results  Component Value Date   PROLACTIN 10.0 09/01/2022   PROLACTIN 10.3 04/16/2022   Lab Results  Component Value Date   CHOL 200 (H) 12/30/2022   TRIG 114 12/30/2022   HDL 40 (L) 12/30/2022   CHOLHDL 5.0 12/30/2022   VLDL 23 12/30/2022   LDLCALC 137 (H) 12/30/2022   LDLCALC 170 (H) 09/01/2022    Physical Findings: AIMS:   ,  ,   ,   ,     CIWA:    COWS:     Musculoskeletal: Strength & Muscle Tone: within normal limits Gait & Station: normal Patient leans: N/A  Psychiatric Specialty Exam:  Presentation  General Appearance:  Appropriate for Environment; Casual   Eye Contact: Good   Speech: Clear and Coherent; Normal Rate   Speech Volume: Normal   Handedness: Right    Mood and Affect  Mood: Anxious   Affect: Congruent; Full Range    Thought Process  Thought Processes: Coherent; Linear; Goal Directed   Descriptions of Associations:Intact   Orientation:Full (Time, Place and Person)   Thought Content:Logical; WDL   History of Schizophrenia/Schizoaffective disorder:No data recorded  Duration of Psychotic Symptoms:No data recorded  Hallucinations:Hallucinations: None  Ideas of Reference:None   Suicidal Thoughts:Suicidal Thoughts: No  Homicidal Thoughts:Homicidal Thoughts: No   Sensorium  Memory: Immediate Good; Recent Good   Judgment: Fair   Insight: Fair; Shallow    Executive Functions Concentration: Fair   Attention Span: Fair   Recall: Good   Fund of Knowledge: Good   Language: Good    Psychomotor Activity  Psychomotor Activity:Psychomotor Activity: Normal   Assets  Assets: Communication Skills; Desire for Improvement; Financial Resources/Insurance; Housing; Resilience; Social Support; Vocational/Educational    Sleep  Sleep:Sleep: Good    Physical Exam Vitals reviewed.  Constitutional:      General: He is  not in acute distress.  Appearance: He is not toxic-appearing.  HENT:     Head: Normocephalic and atraumatic.     Comments: Multiple facial piercings    Mouth/Throat:     Mouth: Mucous membranes are moist.     Pharynx: Oropharynx is clear.  Pulmonary:     Effort: Pulmonary effort is normal.  Skin:    General: Skin is warm and dry.     Comments: Dressing changed by RN just prior to assessment. Dressing c/d/i  Neurological:     General: No focal deficit present.     Mental Status: He is alert and oriented to person, place, and time.     Gait: Gait normal.      Review of Systems  Gastrointestinal:  Positive for abdominal pain. Negative for constipation, diarrhea, heartburn, nausea and vomiting.  Genitourinary: Negative.   Musculoskeletal:  Negative for myalgias.  Neurological:  Negative for dizziness, weakness and headaches Blood pressure 130/87, pulse 92, temperature 99 F (37.2 C), temperature source Oral, resp. rate 16, height 5' 4.57" (1.64 m), weight 95.7 kg, SpO2 99 %. Body mass index is 35.58 kg/m.   Treatment Plan Summary: Reviewed current treatment plan on 02/11/2023    Patient with multiple inpatient psychiatric hospitalizations and outpatient therapies. Currently awaiting placement at a [facility], but was unable to keep himself safe at home, prompting this admission. Patient to continue hospitalization for safety monitoring and crisis stabilization.    Daily contact with patient to assess and evaluate symptoms and progress in treatment and Medication management Will maintain Q 15 minutes observation for safety.  Estimated LOS:  5-7 days Reviewed admission lab: CMP-WNL (Cr 1.08 but baseline 0.83-.94, so no AKI) , lipids-total cholesterol 200, LDL 137, CBC with differential-WNL, hemoglobin A1c WNL on 4/18, urine pregnancy test negative and TSH is WNL on 4/18, urine tox screen nondetected.  EKG 12-lead-NSR.  Patient has no new labs on 02/11/2023. Medication management:  Social  anxiety disorder, mood disorder- Increase to Abilify 15 mg nightly                                                                                                                     for mood lability; continue Effexor XR 150 mg, BuSpar 10 mg twice daily, propranolol 10 mg nightly.  PRNs: Hydroxyzine 25 mg every 6 hours for anxiety, trazodone 50 to 100 mg nightly for sleep, and Seroquel 25 mg every 6 hours for agitation/anxiety Chronic SI/suicide attempts-continue naltrexone 50 mg daily Gender dysphoria-testosterone cypionate 80 mg IM q. Wednesday GERD-Protonix 80 mg daily Seasonal allergies-Claritin 10 mg daily Will continue to monitor patient's mood and behavior. Social Work will schedule a Family meeting to obtain collateral information and discuss discharge and follow up plan.   Discharge concerns will also be addressed:  Safety, stabilization, and access to medication. Expected date of discharge- 6/4    Lamar Sprinkles, MD 02/11/2023, 10:00 PM
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
## 2023-02-11 NOTE — Progress Notes (Signed)
D) Pt received calm, visible, participating in milieu, and in no acute distress. Pt A & O x4. Pt denies SI, HI, A/ V H, depression, anxiety and pain at this time. A) Pt encouraged to drink fluids. Pt encouraged to come to staff with needs. Pt encouraged to attend and participate in groups. Pt encouraged to set reachable goals.  R) Pt remained safe on unit, in no acute distress, will continue to assess.     02/11/23 2000  Psych Admission Type (Psych Patients Only)  Admission Status Voluntary  Psychosocial Assessment  Patient Complaints Sleep disturbance;Depression  Eye Contact Fair  Facial Expression Anxious  Affect Anxious  Speech Logical/coherent  Interaction Assertive  Motor Activity Fidgety  Appearance/Hygiene Unremarkable  Behavior Characteristics Cooperative  Mood Anxious;Depressed  Thought Process  Coherency WDL  Content WDL  Delusions None reported or observed  Perception WDL  Hallucination None reported or observed  Judgment Limited  Confusion None  Danger to Self  Current suicidal ideation? Denies  Agreement Not to Harm Self Yes  Description of Agreement verbal  Danger to Others  Danger to Others None reported or observed

## 2023-02-11 NOTE — Group Note (Signed)
Recreation Therapy Group Note   Group Topic:Leisure Education  Group Date: 02/11/2023 Start Time: 1045 End Time: 1130 Facilitators: Arneda Sappington, Benito Mccreedy, LRT Location: [address]  Group Description: Leisure Facilities manager. In teams of 3-4, patients were asked to create a list of leisure activities to correspond with a letter of the alphabet selected by LRT. Time limit of 1 minute and 30 seconds per round. Points were awarded for each unique answer identified by a team. After several rounds of game play, using different letters, the team with the most points were declared winners. Post-activity discussion reviewed benefits of positive recreation outlets: reducing stress, improving coping mechanisms, increasing self-esteem, and building stronger support systems.   Goal Area(s) Addresses:  Patient will successfully identify positive leisure and recreation activities.  Patient will acknowledge benefits of participation in healthy leisure activities post discharge.  Patient will actively work with peers toward a shared goal.    Education: Teacher, English as a foreign language, Stress Management, Civil Service fast streamer Factors, Support Systems and Socialization, Discharge Planning   Affect/Mood: Congruent and Flat to Euthymic   Participation Level: Moderate and Active   Participation Quality: Independent   Behavior: Appropriate, Cooperative, and Interactive    Speech/Thought Process: Coherent, Directed, and Oriented   Insight: Good   Judgement: Moderate   Modes of Intervention: Competitive Play, Education, and Guided Discussion   Patient Response to Interventions:  Attentive and Receptive   Education Outcome:  Acknowledges education and In group clarification offered    Clinical Observations/Individualized Feedback: "Antonio [name]" was mostly active in their participation of session activities and group discussion. Pt contributed and shared ideas among teammates by writing during each round of game play. Pt  identified "my mom" as a member of their social support system they would like to a build healthier relationship with. Pt reflected "cooking" as an activity they would like to do with that person to create positive interactions post d/c. Pt reflected this leisure skill would be beneficial in the near future as they plan to attend college.   Plan: Continue to engage patient in RT group sessions 2-3x/week.   Benito Mccreedy Antonio [name], LRT, CTRS 02/11/2023 12:53 PM

## 2023-02-11 NOTE — [facility] Group Notes (Signed)
[facility] Group Notes:  (Nursing/MHT/Case Management/Adjunct)  Date:  02/11/2023  Time:  8:28 PM  Type of Therapy:   Group Wrap  Participation Level:  Active  Participation Quality:  Appropriate  Affect:  Appropriate  Cognitive:  Appropriate  Insight:  Appropriate  Engagement in Group:  Engaged  Modes of Intervention:  Support  Summary of Progress/Problems: Pt shared "Im glad I got to speak to my mother, but I didn't get to tell her why I'm here, I forgot but tomorrow will remember".  Granville Lewis 02/11/2023, 8:28 PM
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
## 2023-02-12 NOTE — Progress Notes (Signed)
Patient appears pleasant. Patient denies SI/HI/AVH. Pt reports anxiety is 0/10 and depression is 0/10. Pt reports fair sleep and good appetite. Patient complied with morning medication with no reported side effects. Dressing changed, scant serosanguinous fluid noted. Patient remains safe on Q52min checks and contracts for safety.      02/12/23 1124  Psych Admission Type (Psych Patients Only)  Admission Status Voluntary  Psychosocial Assessment  Patient Complaints Sleep disturbance;Depression  Eye Contact Fair  Facial Expression Anxious  Affect Anxious  Speech Logical/coherent  Interaction Assertive  Motor Activity Fidgety  Appearance/Hygiene Unremarkable  Behavior Characteristics Cooperative;Anxious  Mood Anxious;Depressed  Thought Process  Coherency WDL  Content WDL  Delusions None reported or observed  Perception WDL  Hallucination None reported or observed  Judgment Limited  Confusion None  Danger to Self  Current suicidal ideation? Denies  Agreement Not to Harm Self Yes  Description of Agreement verbal  Danger to Others  Danger to Others None reported or observed

## 2023-02-12 NOTE — [facility] Group Notes (Signed)
Group Topic/Focus:  Goals Group:   The focus of this group is to help patients establish daily goals to achieve during treatment and discuss how the patient can incorporate goal setting into their daily lives to aide in recovery.       Participation Level:  Active   Participation Quality:  Attentive   Affect:  Appropriate   Cognitive:  Appropriate   Insight: Appropriate   Engagement in Group:  Engaged   Modes of Intervention:  Discussion   Additional Comments:   Patient attended goals group and was attentive the duration of it. Patient's goal was to communicate more about what he did w/his mom.Pt has no feelings of wanting to hurt himself or others.

## 2023-02-12 NOTE — Group Note (Signed)
LCSW Group Therapy Note   02/12/2023 3:21 PM  Type of Therapy and Topic:  Group Therapy:   Teen Introduction to Cognitive-Behavioral Therapy    Participation Level:  Active  Description of Group: Participants were asked to identify the first thought they would think if CSW brought 4 dogs into the room, explaining that they could express their true reactions without fear of judgment because it is merely a hypothetical situation being used as an Office manager.  The responses ranged from "I need to leave" to "I want to pet them all."  These were listed on the white board and it was explained how our thoughts come from our life experiences and this example illustrates how different patients' life experiences with dogs have been.  Feelings and likely actions were then elicited and discussed.  This was used to introduce the Cognitive Behavioral model.  It was explained that our thoughts are often the easiest or fastest element to change in order to make a difference in our ultimate emotions and behaviors.  The remainder of the meeting was spent in discussing several cognitive distortions including mind reading, catastrophizing, the fallacy of fairness, all or nothing thinking, .  There was an emphasis on the need to "think about our thinking" to see if thoughts are based on accurate information.  That way, our resulting feelings and subsequent actions can be more based on reality, helpful, and balanced.  Patients were provided copies of Cognitive Distortions, a total of 16, with which to acquaint themselves.    Therapeutic Goals:   Patient will identify the thought, feeling, and action they would likely have if 4 dogs came running into the room Patient will understand the relationship between thoughts, emotions and triggers.  Patient will state one lesson they will take from today's group   Summary of Patient Progress:   Patient's overall reaction to the thought of 4 dogs running into the dayroom was that  dogs are nice and he likes them which patient believed would be followed by the feeling of wanting to approach them and the action of doing so.  Patient was attentive, was more aware of CBT principles than others in the room because he has had DBT.  He responded at times when others did not..    Therapeutic Modalities: Cognitive Behavioral Therapy

## 2023-02-12 NOTE — [facility] Group Notes (Signed)
Adult Psychoeducational Group Note  Date:  02/12/2023 Time:  8:43 PM  Group Topic/Focus:  Wrap-Up Group:   The focus of this group is to help patients review their daily goal of treatment and discuss progress on daily workbooks.  Participation Level:  Active  Participation Quality:  Attentive  Affect:  Appropriate  Cognitive:  Appropriate  Insight: Appropriate  Engagement in Group:  Improving  Modes of Intervention:  Discussion  Additional Comments: Pt attended wrap up group and was attentive the duration of the group. Pt stated the goal for today was to communicate more with his mother about what he did. Pt stated he did not reach out to his mother today but plans to contact her tomorrow. Pt stated he work on himself today. Pt stated his goal for tomorrow is to do more self reflecting and work on Pharmacologist.  Felipa Furnace 02/12/2023, 8:43 PM

## 2023-02-13 DIAGNOSIS — Z7289 Other problems related to lifestyle: Secondary | ICD-10-CM

## 2023-02-13 NOTE — Progress Notes (Signed)
   02/13/23 2000  Psychosocial Assessment  Patient Complaints Anxiety;Depression  Eye Contact Brief  Facial Expression Flat  Affect Flat  Speech Logical/coherent  Interaction Minimal  Motor Activity Slow  Appearance/Hygiene Unremarkable  Behavior Characteristics Cooperative  Mood Depressed;Anxious  Thought Process  Coherency WDL  Content WDL  Delusions None reported or observed  Perception WDL  Hallucination None reported or observed  Judgment Limited  Confusion None  Danger to Self  Current suicidal ideation? Denies  Danger to Others  Danger to Others None reported or observed   Antonio [name] reports feeling"tired" tonight . He interacts minimally with peers and staff. Antonio [name] attended wrap-up group and spent some free time in dayroom but went to bed early. He rates both his anxiety and depression a 1/10 with 10 being the worse. Wound left forearm healing well without problems noted. Identifies primary stressor prior cutting was "school".

## 2023-02-13 NOTE — Progress Notes (Signed)
[facility] MD Progress Note  02/13/2023 11:52 AM Antonio [name]  MRN:  [id number] Subjective:    Pt was seen and evaluated on the unit. Their records were reviewed prior to evaluation. Per nursing no acute events overnight. He took all his medications without any issues.    He reports that his day went well yesterday.  He reports that he talked to his mom yesterday, mom is contemplating on which place they should decide on residential facility.  He says that overall his mood is at 7 out of 10, 10 being the best mood, has not been having any suicidal thoughts or self-harm thoughts or behaviors since admission.  He says that he is not anxious, rates his anxiety around 2 out of 10, 10 being most anxious about graduation.  He reports that he is looking forward to be at the graduation.  He says that he slept well last night, has been eating well, attended groups yesterday, it was about cognitive and behavioral therapy and in the evening they also did karaoke.  We discussed if he had any reflection on the events that led to his hospitalization.  He says that he did not and Clinical research associate encouraged him to reflect and think what he can do in the future to manage his distress and avoid cutting.  He was receptive to this.   Principal Problem: Deliberate self-cutting Diagnosis: Principal Problem:   Deliberate self-cutting Active Problems:   Gender dysphoria   Social anxiety disorder   Gastroesophageal reflux disease   MDD (major depressive disorder), recurrent severe, without psychosis (HCC)   Suicidal ideations  Total Time spent with patient:   I personally spent 45 minutes on the unit in direct patient care. The direct patient care time included face-to-face time with the patient, reviewing the patient's chart, communicating with other professionals, and coordinating care. Greater than 50% of this time was spent in counseling or coordinating care with the patient regarding goals of hospitalization, psycho-education,  and discharge planning needs.   Past Psychiatric History: As mentioned in initial H&P, reviewed today, no change   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   Asthma    Bupropion overdose 01/16/2020   Complication of anesthesia    gets disoriented and shakes after surgery   Deliberate self-cutting 10/17/2018   Depression    Eczema    Fracture of 5th metatarsal    Gender dysphoria    Intentional overdose of drug in tablet form (HCC) 04/03/2020   MDD (major depressive disorder), recurrent severe, without psychosis (HCC) 11/02/2021   Moderate episode of recurrent major depressive disorder (HCC) 04/24/2020   Multiple allergies    Severe episode of recurrent major depressive disorder, without psychotic features (HCC) 01/02/2020   Suicidal ideations 08/31/2022   Suicide attempt by cutting of wrist (HCC) 07/28/2022    Past Surgical History:  Procedure Laterality Date   SUPPRELIN IMPLANT Left 01/22/2019   Procedure: SUPPRELIN IMPLANT;  Surgeon: Kandice Hams, MD;  Location: [facility];  Service: Pediatrics;  Laterality: Left;   SUPPRELIN REMOVAL N/A 03/03/2020   Procedure: SUPPRELIN REMOVAL;  Surgeon: Kandice Hams, MD;  Location: [facility];  Service: Pediatrics;  Laterality: N/A;   TONSILLECTOMY     TYMPANOSTOMY TUBE PLACEMENT     Family History:  Family History  Problem Relation Age of Onset   Depression Mother    Anxiety disorder  Mother    Hypertension Maternal Grandmother    Allergic rhinitis Neg Hx    Angioedema Neg Hx    Atopy Neg Hx    Eczema Neg Hx    Immunodeficiency Neg Hx    Urticaria Neg Hx    Family Psychiatric  History: As mentioned in initial H&P, reviewed today, no change  Social History:  Social History   Substance and Sexual Activity  Alcohol Use No     Social History   Substance and Sexual Activity  Drug  Use Not Currently   Types: Marijuana    Social History   Socioeconomic History   Marital status: Single    Spouse name: Not on file   Number of children: Not on file   Years of education: Not on file   Highest education level: Not on file  Occupational History   Not on file  Tobacco Use   Smoking status: Never   Smokeless tobacco: Never  Vaping Use   Vaping Use: Former   Substances: Nicotine, Flavoring   Devices: Nicotine  Substance and Sexual Activity   Alcohol use: No   Drug use: Not Currently    Types: Marijuana   Sexual activity: Not Currently  Other Topics Concern   Not on file  Social History Narrative   Lives with mom and stepdad.      He is in 12th grade at [employer] 23-24 school year      He enjoys writing and listen to music, writes poetry   Social Determinants of Health   Financial Resource Strain: Not on file  Food Insecurity: Not on file  Transportation Needs: Not on file  Physical Activity: Not on file  Stress: Not on file  Social Connections: Not on file   Additional Social History:                         Sleep: Good  Appetite:  Good  Current Medications: Current Facility-Administered Medications  Medication Dose Route Frequency Provider Last Rate Last Admin   acetaminophen (TYLENOL) tablet 500-1,000 mg  500-1,000 mg Oral Q6H PRN Sindy Guadeloupe, NP       albuterol (VENTOLIN HFA) 108 (90 Base) MCG/ACT inhaler 1-2 puff  1-2 puff Inhalation Q4H PRN Sindy Guadeloupe, NP       alum & mag hydroxide-simeth (MAALOX/MYLANTA) 200-200-20 MG/5ML suspension 30 mL  30 mL Oral Q6H PRN Sindy Guadeloupe, NP       ARIPiprazole (ABILIFY) tablet 15 mg  15 mg Oral QHS Lamar Sprinkles, MD   15 mg at 02/12/23 2050   busPIRone (BUSPAR) tablet 10 mg  10 mg Oral BID Sindy Guadeloupe, NP   10 mg at 02/13/23 0815   EPINEPHrine (EPI-PEN) injection 0.3 mg  0.3 mg Intramuscular PRN Sindy Guadeloupe, NP       fluticasone (FLONASE) 50 MCG/ACT nasal spray 1 spray  1  spray Each Nare BID PRN Sindy Guadeloupe, NP       loratadine (CLARITIN) tablet 10 mg  10 mg Oral Daily Sindy Guadeloupe, NP   10 mg at 02/13/23 0815   OLANZapine zydis (ZYPREXA) disintegrating tablet 5 mg  5 mg Oral Q8H PRN Sindy Guadeloupe, NP       And   LORazepam (ATIVAN) tablet 1 mg  1 mg Oral PRN Sindy Guadeloupe, NP       And   ziprasidone (GEODON) injection 20 mg  20 mg Intramuscular PRN Sindy Guadeloupe, NP  naltrexone (DEPADE) tablet 50 mg  50 mg Oral Daily Sindy Guadeloupe, NP   50 mg at 02/13/23 0815   olopatadine (PATANOL) 0.1 % ophthalmic solution 1 drop  1 drop Both Eyes TID PRN Lamar Sprinkles, MD       pantoprazole (PROTONIX) EC tablet 80 mg  80 mg Oral Daily Lamar Sprinkles, MD   80 mg at 02/13/23 0815   propranolol (INDERAL) tablet 10 mg  10 mg Oral Dorthey Sawyer, NP   10 mg at 02/12/23 2050   testosterone cypionate (DEPOTESTOSTERONE CYPIONATE) injection 80 mg  80 mg Intramuscular Q Wed Jonnalagadda, Sharyne Peach, MD   80 mg at 02/10/23 1130   traZODone (DESYREL) tablet 50 mg  50 mg Oral QHS PRN Sindy Guadeloupe, NP       venlafaxine XR (EFFEXOR-XR) 24 hr capsule 150 mg  150 mg Oral Q breakfast Sindy Guadeloupe, NP   150 mg at 02/13/23 4098    Lab Results: No results found for this or any previous visit (from the past 48 hour(s)).  Blood Alcohol level:  Lab Results  Component Value Date   ETH <10 02/09/2023   ETH <10 12/28/2022    Metabolic Disorder Labs: Lab Results  Component Value Date   HGBA1C 5.1 12/30/2022   MPG 99.67 12/30/2022   MPG 105 09/01/2022   Lab Results  Component Value Date   PROLACTIN 10.0 09/01/2022   PROLACTIN 10.3 04/16/2022   Lab Results  Component Value Date   CHOL 200 (H) 12/30/2022   TRIG 114 12/30/2022   HDL 40 (L) 12/30/2022   CHOLHDL 5.0 12/30/2022   VLDL 23 12/30/2022   LDLCALC 137 (H) 12/30/2022   LDLCALC 170 (H) 09/01/2022    Physical Findings: AIMS:  , ,  ,  ,    CIWA:    COWS:     Musculoskeletal:  Gait & Station:  normal Patient leans: N/A  Psychiatric Specialty Exam:  Presentation  General Appearance:  Appropriate for Environment; Casual; Fairly Groomed  Eye Contact: Fair  Speech: Clear and Coherent; Normal Rate  Speech Volume: Normal  Handedness: Right   Mood and Affect  Mood: -- ("good")  Affect: Appropriate; Congruent; Restricted   Thought Process  Thought Processes: Coherent; Goal Directed; Linear  Descriptions of Associations:Intact  Orientation:Full (Time, Place and Person)  Thought Content:Logical  History of Schizophrenia/Schizoaffective disorder:No data recorded Duration of Psychotic Symptoms:No data recorded Hallucinations:Hallucinations: None  Ideas of Reference:None  Suicidal Thoughts:Suicidal Thoughts: No SI Active Intent and/or Plan: Without Intent; Without Plan  Homicidal Thoughts:Homicidal Thoughts: No   Sensorium  Memory: Immediate Fair; Recent Fair; Remote Fair  Judgment: Fair  Insight: Fair   Chartered certified accountant: Fair  Attention Span: Fair  Recall: [employer] of Knowledge: Fair  Language: Fair   Psychomotor Activity  Psychomotor Activity: Psychomotor Activity: Normal   Assets  Assets: Communication Skills; Desire for Improvement; Financial Resources/Insurance; Leisure Time; Physical Health; Social Support; English as a second language teacher; Vocational/Educational; Housing   Sleep  Sleep: Sleep: Fair    Physical Exam: Physical Exam Constitutional:      Appearance: Normal appearance.  Cardiovascular:     Rate and Rhythm: Normal rate.  Pulmonary:     Effort: Pulmonary effort is normal.  Musculoskeletal:        General: Normal range of motion.     Cervical back: Normal range of motion.  Skin:    Comments: Bandage covered on the stitched on left forearm  Neurological:     Mental Status: He is  alert.    ROS Review of 12 systems negative except as mentioned in HPI  Blood pressure 130/84, pulse (!) 105,  temperature 99 F (37.2 C), temperature source Oral, resp. rate 16, height 5' 4.57" (1.64 m), weight 95.7 kg, SpO2 100 %. Body mass index is 35.58 kg/m.   Treatment Plan Summary:  Margo Aye is an 17 yo AFAB and now identifies self as transgender male, on hormone therapy x 4 years, with psychiatric history significant of mood disorder, GAD, autism spectrum disorder, social anxiety disorder and multiple previous psychiatric hospitalizations for suicidal thoughts, self-harm behaviors, suicide attempt and residential treatment center treatment history.  He is admitted this time for lacerating his forearm with a pencil sharpener blade which required 17 stitches.  He denies any specific triggers associated with this prior to admission, however does have some recent stressors such as exams and upcoming graduation from high school.  Despite outpatient psychiatric medication management, individual and group therapy on a weekly basis he has continued to struggle this year and therefore was previously recommended residential treatment facility which they are planning to go to after graduation from high school.    At present he continues to report improvement with mood, anxiety, denies any self-harm thoughts or behaviors, denies any SI or HI, participating in group and milieu activities, appears to have good interactions with his mother.  Tolerating increased dose of Abilify well.    Daily contact with patient to assess and evaluate symptoms and progress in treatment and Medication management  Will maintain Q 15 minutes observation for safety.  Estimated LOS:  5-7 days Reviewed admission lab: CMP-WNL (Cr 1.08 but baseline 0.83-.94, so no AKI) , lipids-total cholesterol 200, LDL 137, CBC with differential-WNL, hemoglobin A1c WNL on 4/18, urine pregnancy test negative and TSH is WNL on 4/18, urine tox screen nondetected.  EKG 12-lead-NSR.  Patient has no new labs on 02/11/2023. Medication management:  Social anxiety  disorder, mood disorder- Continue Abilify 15 mg nightly for mood lability; continue Effexor XR 150 mg; BuSpar 10 mg twice daily; propranolol 10 mg nightly.   PRNs: Hydroxyzine 25 mg every 6 hours for anxiety, trazodone 50 to 100 mg nightly for sleep, and Seroquel 25 mg every 6 hours for agitation/anxiety Chronic SI/suicide attempts-continue naltrexone 50 mg daily Gender dysphoria-testosterone cypionate 80 mg IM q. Wednesday GERD-Protonix 80 mg daily Seasonal allergies-Claritin 10 mg daily Will continue to monitor patient's mood and behavior. Social Work will schedule a Family meeting to obtain collateral information and discuss discharge and follow up plan.   Discharge concerns will also be addressed:  Safety, stabilization, and access to medication. Expected date of discharge- 6/4  [name], MD 02/13/2023, 11:52 AM

## 2023-02-13 NOTE — Progress Notes (Signed)
   02/12/23 2244  Psych Admission Type (Psych Patients Only)  Admission Status Voluntary  Psychosocial Assessment  Patient Complaints Sleep disturbance;Anxiety  Eye Contact Fair  Facial Expression Anxious  Affect Anxious  Speech Logical/coherent  Interaction Assertive  Motor Activity Fidgety  Appearance/Hygiene Unremarkable  Behavior Characteristics Cooperative  Mood Depressed;Anxious  Thought Process  Coherency WDL  Content WDL  Delusions WDL  Perception WDL  Hallucination None reported or observed  Judgment Limited  Confusion WDL  Danger to Self  Current suicidal ideation? Denies  Danger to Others  Danger to Others None reported or observed   Pt affect flat, mood depressed, rated his day a 7/10 and goal was to communicate more with his mother, denies SI/HI or hallucinations (a) 15 min checks (r) safety maintained.

## 2023-02-13 NOTE — BHH Group Notes (Signed)
Group Topic/Focus:  Goals Group:   The focus of this group is to help patients establish daily goals to achieve during treatment and discuss how the patient can incorporate goal setting into their daily lives to aide in recovery.  Participation Level:  Active  Participation Quality:  Appropriate  Affect:  Appropriate  Cognitive:  Appropriate  Insight:  Appropriate  Engagement in Group:  Engaged  Modes of Intervention:  Education  Additional Comments:  Pt attended goals group today. Pts goal today is to reflect on what got them here. Pt is feeling no anger or SI. Pt nurse has been notified

## 2023-02-13 NOTE — Progress Notes (Signed)
Pt calm, cooperative this shift. Pt denies SI/HI/AVH on assessment. Pt reports sleeping and eating well. Pt participated well in unit programming. Pt is compliant with medications. No aggressive or self injurious behaviors noted this shift.   

## 2023-02-14 DIAGNOSIS — R4588 Nonsuicidal self-harm: Secondary | ICD-10-CM

## 2023-02-14 DIAGNOSIS — F649 Gender identity disorder, unspecified: Secondary | ICD-10-CM

## 2023-02-14 DIAGNOSIS — F332 Major depressive disorder, recurrent severe without psychotic features: Principal | ICD-10-CM

## 2023-02-14 DIAGNOSIS — F4010 Social phobia, unspecified: Secondary | ICD-10-CM

## 2023-02-14 MED ORDER — TRAZODONE HCL 50 MG PO TABS: 50.0000 mg | ORAL_TABLET | Freq: Every evening | ORAL | 0 refills | Status: DC | PRN

## 2023-02-14 MED ORDER — PROPRANOLOL HCL 10 MG PO TABS: 10.0000 mg | ORAL_TABLET | Freq: Every day | ORAL | 0 refills | Status: DC

## 2023-02-14 MED ORDER — BUSPIRONE HCL 10 MG PO TABS: 10.0000 mg | ORAL_TABLET | Freq: Two times a day (BID) | ORAL | 0 refills | Status: DC

## 2023-02-14 MED ORDER — VENLAFAXINE HCL ER 150 MG PO CP24
150.0000 mg | ORAL_CAPSULE | Freq: Every day | ORAL | 0 refills | Status: DC
Start: 2023-02-14 — End: 2023-05-19

## 2023-02-14 MED ORDER — ARIPIPRAZOLE 15 MG PO TABS: 15.0000 mg | ORAL_TABLET | Freq: Every day | ORAL | 0 refills | Status: DC

## 2023-02-14 MED ORDER — ZYRTEC ALLERGY 10 MG PO TABS: 10.0000 mg | ORAL_TABLET | Freq: Every day | ORAL | 0 refills | Status: DC | PRN

## 2023-02-14 NOTE — BHH Suicide Risk Assessment (Signed)
[facility] Discharge Suicide Risk Assessment   Principal Problem: Deliberate self-cutting Discharge Diagnoses: Principal Problem:   Deliberate self-cutting Active Problems:   Gender dysphoria   MDD (major depressive disorder), recurrent severe, without psychosis (HCC)   Social anxiety disorder   Gastroesophageal reflux disease   Suicidal ideations   Total Time spent with patient: 30 minutes  Musculoskeletal: Strength & Muscle Tone: within normal limits Gait & Station: normal Patient leans: N/A  Psychiatric Specialty Exam  Presentation  General Appearance:  Appropriate for Environment; Casual  Eye Contact: Good  Speech: Clear and Coherent  Speech Volume: Normal  Handedness: Right   Mood and Affect  Mood: Euthymic  Duration of Depression Symptoms: No data recorded Affect: Appropriate; Congruent   Thought Process  Thought Processes: Coherent; Goal Directed  Descriptions of Associations:Intact  Orientation:Full (Time, Place and Person)  Thought Content:Logical  History of Schizophrenia/Schizoaffective disorder:No data recorded Duration of Psychotic Symptoms:No data recorded Hallucinations:Hallucinations: None  Ideas of Reference:None  Suicidal Thoughts:Suicidal Thoughts: No SI Active Intent and/or Plan: Without Intent; Without Plan  Homicidal Thoughts:Homicidal Thoughts: No   Sensorium  Memory: Immediate Good; Recent Good; Remote Good  Judgment: Intact  Insight: Fair   Art therapist  Concentration: Good  Attention Span: Good  Recall: Good  Fund of Knowledge: Good  Language: Good   Psychomotor Activity  Psychomotor Activity: Psychomotor Activity: Normal   Assets  Assets: Communication Skills; Desire for Improvement; Financial Resources/Insurance; Housing; Leisure Time; Transportation; Talents/Skills; Social Support; Physical Health   Sleep  Sleep: Sleep: Good Number of Hours of Sleep: 9   Physical  Exam: Physical Exam ROS Blood pressure (!) 141/85, pulse 91, temperature 98.6 F (37 C), temperature source Oral, resp. rate 16, height 5' 4.57" (1.64 m), weight 95.7 kg, SpO2 100 %. Body mass index is 35.58 kg/m.  Mental Status Per Nursing Assessment::   On Admission:  Suicidal ideation indicated by patient, Self-harm behaviors, Self-harm thoughts  Demographic Factors:  Adolescent or young adult and 17 years old FTM trans  Loss Factors: NA  Historical Factors: Impulsivity  Risk Reduction Factors:   Sense of responsibility to family, Religious beliefs about death, Living with another person, especially a relative, Positive social support, Positive therapeutic relationship, and Positive coping skills or problem solving skills  Continued Clinical Symptoms:  Severe Anxiety and/or Agitation Depression:   Recent sense of peace/wellbeing More than one psychiatric diagnosis Previous Psychiatric Diagnoses and Treatments  Cognitive Features That Contribute To Risk:  Polarized thinking    Suicide Risk:  Minimal: No identifiable suicidal ideation.  Patients presenting with no risk factors but with morbid ruminations; may be classified as minimal risk based on the severity of the depressive symptoms   Follow-up Information     [facility] Follow up.   Why: You have an appointment with Dr. Marquis Lunch for medication management on 02/18/2023 at 11:30am.        [facility] Follow up.   Why: You have an appointment for therapy services on 02/18/2023 at 5:30pm. Contact information: [address] [place] [address] [phone]                 Plan Of Care/Follow-up recommendations:  Activity:  As tolerated Diet:  Regular  Leata Mouse, MD 02/14/2023, 2:56 PM

## 2023-02-14 NOTE — Progress Notes (Signed)
[facility] Child/Adolescent Case Management Discharge Plan :  Will you be returning to the same living situation after discharge: Yes,  with parents At discharge, do you have transportation home?:Yes,  Mother [name] will pick up patient at discharge.  Do you have the ability to pay for your medications:Yes,  patient has insurance coverage.   Release of information consent forms completed and in the chart;  Patient's signature needed at discharge.  Patient to Follow up at:  Follow-up Information     [facility] Follow up.   Why: You have an appointment with Dr. Marquis Lunch for medication management on 02/18/2023 at 11:30am.        [facility] Follow up.   Why: You have an appointment for therapy services on 02/18/2023 at 5:30pm. Contact information: [address] [place] [address] [phone]                 Family Contact:  Telephone:  Spoke with:  CSW spoke with mother.   Patient denies SI/HI:   Yes,  patient denies SI/HI/AVH     Safety Planning and Suicide Prevention discussed:  Yes,  SPE completed with mother.   Parent/caregiver will pick up patient for discharge at 5:30pm. Patient to be discharged by RN. RN will have parent/caregiver sign release of information (ROI) forms and will be given a suicide prevention (SPE) pamphlet for reference. RN will provide discharge summary/AVS and will answer all questions regarding medications and appointments. Veva Holes, LCSWA 02/14/2023, 11:23 AM

## 2023-02-14 NOTE — BHH Group Notes (Signed)
Group Topic/Focus:  Goals Group:   The focus of this group is to help patients establish daily goals to achieve during treatment and discuss how the patient can incorporate goal setting into their daily lives to aide in recovery.  Participation Level:  Active  Participation Quality:  Appropriate  Affect:  Appropriate  Cognitive:  Appropriate  Insight:  Appropriate  Engagement in Group:  Engaged  Modes of Intervention:  Education  Additional Comments:  Pt attended goals group. Pts goal is to communicate with mom about why they are here. Pt is not feeling angry or SI. Pt nurse has been notified.

## 2023-02-14 NOTE — Group Note (Signed)
LCSW Group Therapy Note   Group Date: 02/14/2023 Start Time: 1430 End Time: 1530  Type of Therapy and Topic:  Group Therapy:  Healthy vs Unhealthy Coping Skills  Participation Level:  Active   Description of Group:  The focus of this group was to determine what unhealthy coping techniques typically are used by group members and what healthy coping techniques would be helpful in coping with various problems. Patients were guided in becoming aware of the differences between healthy and unhealthy coping techniques.  Patients were asked to identify 1-2 healthy coping skills they would like to learn to use more effectively, and many mentioned meditation, breathing, and relaxation.  At the end of group, additional ideas of healthy coping skills were shared in a fun exercise.  Therapeutic Goals Patients learned that coping is what human beings do all day long to deal with various situations in their lives Patients defined and discussed healthy vs unhealthy coping techniques Patients identified their preferred coping techniques and identified whether these were healthy or unhealthy Patients determined 1-2 healthy coping skills they would like to become more familiar with and use more often Patients provided support and ideas to each other  Summary of Patient Progress: During group, patient expressed the unhealthy coping skills used in the past were starvation, drugs, hurting others and self harm. Patient reports these coping skills were unhealthy because it hurt others around him and negatively affected him mentally. Patient reports the healthy coping skills used in the past were listening to music, writing poetry and drawing. Patient was able to determined 5 new healthy coping skills they would like to become more familiar with and use more often post discharge.    Therapeutic Modalities Cognitive Behavioral Therapy Motivational Interviewing  Veva Holes, MSW, LCSW-A  02/14/2023 5:07pm

## 2023-02-14 NOTE — Discharge Summary (Signed)
Physician Discharge Summary Note  Patient:  Antonio [name] is an 17 y.o., adult MRN:  [id number] DOB:  2006-08-21 Patient phone:  [phone] (home)  Patient address:   [address] [place] [facility] [id number],  Total Time spent with patient: 30 minutes  Date of Admission:  02/09/2023 Date of Discharge: 02/14/2023   Reason for Admission:   Antonio [name] is an 17 year old transgender male on hormone therapy x 4 years, domiciled with biological mother and stepfather, 12th grader at [employer] with PPhx of social anxiety disorder, MDD, GAD, ASD who  was admitted voluntarily from  [facility] ED after lacerating arm with pencil sharpener blade; for which he has been admitted for the same multiple times in the past.   Principal Problem: Deliberate self-cutting Discharge Diagnoses: Principal Problem:   Deliberate self-cutting Active Problems:   Gender dysphoria   MDD (major depressive disorder), recurrent severe, without psychosis (HCC)   Social anxiety disorder   Gastroesophageal reflux disease   Suicidal ideations   Past Psychiatric History: Reviewed from H&P and no changes   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   Asthma    Bupropion overdose 01/16/2020   Complication of anesthesia    gets disoriented and shakes after surgery   Deliberate self-cutting 10/17/2018   Depression    Eczema    Fracture of 5th metatarsal    Gender dysphoria    Intentional overdose of drug in tablet form (HCC) 04/03/2020   MDD (major depressive disorder), recurrent severe, without psychosis (HCC) 11/02/2021   Moderate episode of recurrent major depressive disorder (HCC) 04/24/2020   Multiple allergies    Severe episode of recurrent major depressive disorder, without psychotic features (HCC) 01/02/2020   Suicidal ideations 08/31/2022   Suicide attempt by cutting  of wrist (HCC) 07/28/2022    Past Surgical History:  Procedure Laterality Date   SUPPRELIN IMPLANT Left 01/22/2019   Procedure: SUPPRELIN IMPLANT;  Surgeon: Kandice Hams, MD;  Location: [facility];  Service: Pediatrics;  Laterality: Left;   SUPPRELIN REMOVAL N/A 03/03/2020   Procedure: SUPPRELIN REMOVAL;  Surgeon: Kandice Hams, MD;  Location: [facility];  Service: Pediatrics;  Laterality: N/A;   TONSILLECTOMY     TYMPANOSTOMY TUBE PLACEMENT     Family History:  Family History  Problem Relation Age of Onset   Depression Mother    Anxiety disorder Mother    Hypertension Maternal Grandmother    Allergic rhinitis Neg Hx    Angioedema Neg Hx    Atopy Neg Hx    Eczema Neg Hx    Immunodeficiency Neg Hx    Urticaria Neg Hx    Family Psychiatric  History: Reviewed from H&P and no changes  Social History:  Social History   Substance and Sexual Activity  Alcohol Use No     Social History   Substance and Sexual Activity  Drug Use Not Currently   Types: Marijuana    Social History   Socioeconomic History   Marital status: Single    Spouse name: Not on file   Number of children: Not on file   Years of education: Not on file   Highest education level: Not on file  Occupational History   Not on file  Tobacco Use   Smoking status: Never   Smokeless tobacco: Never  Vaping Use   Vaping Use: Former  Substances: Nicotine, Flavoring   Devices: Nicotine  Substance and Sexual Activity   Alcohol use: No   Drug use: Not Currently    Types: Marijuana   Sexual activity: Not Currently  Other Topics Concern   Not on file  Social History Narrative   Lives with mom and stepdad.      He is in 12th grade at [employer] 23-24 school year      He enjoys writing and listen to music, writes poetry   Social Determinants of Health   Financial Resource Strain: Not on file  Food Insecurity: Not on file  Transportation Needs: Not on file   Physical Activity: Not on file  Stress: Not on file  Social Connections: Not on file    Hospital Course:  Patient was admitted to the Child and Adolescent  unit at [facility] under the service of Dr. Elsie Saas. Safety:Placed in Q15 minutes observation for safety. During the course of this hospitalization patient did not required any change on his observation and no PRN or time out was required.  No major behavioral problems reported during the hospitalization.  Routine labs reviewed:  CMP-WNL (Cr 1.08 but baseline 0.83-.94, so no AKI) , lipids-total cholesterol 200, LDL 137, CBC with differential-WNL, hemoglobin A1c WNL on 4/18, urine pregnancy test negative and TSH is WNL on 4/18, urine tox screen nondetected.  EKG 12-lead-NSR.  An individualized treatment plan according to the patient's age, level of functioning, diagnostic considerations and acute behavior was initiated.  Preadmission medications, according to the guardian, consisted of Effexor XR 150 mg daily with breakfast, trazodone 50 mg daily at bedtime as needed for sleep, buspirone's 10 mg 2 times daily, propranolol 10 mg daily at bedtime, naltrexone 50 mg daily, Abilify 10 mg daily at bedtime.  Patient also takes a lot of medications for medical conditions including albuterol inhaler, EpiPen, Flonase, Prilosec, Pataday 0.1% ophthalmic solution, testosterone 80 mg subcutaneous every Wednesday for gender dysphoria and also take Zyrtec 10 mg daily as needed for allergies. During this hospitalization he participated in all forms of therapy including  group, milieu, and family therapy.  Patient met with his psychiatrist on a daily basis and received full nursing service.  Due to long standing mood/behavioral symptoms the patient was started on home medications titrated dose of Abilify 15 mg nightly every night for mood lability, Effexor XR 150 mg for depression, BuSpar 10 mg twice daily for social anxiety, propranolol 10 mg  nightly for panic anxiety.  Patient also received hydroxyzine 25 mg every 6 hours as needed trazodone 50 to 100 mg daily at nighttime for sleep and Seroquel 25 mg every 6 hours as needed for agitation/anxiety.  Patient received naltrexone for self-injurious behaviors and testosterone 80 mg IM was given on Wednesday as mom requested and Protonix 8 mg daily and Claritin 10 mg daily during this hospitalization.  Patient tolerated the above medication without adverse effects.  Patient participated milieu therapy, group therapeutic activities learn daily mental health goals and several coping mechanisms.  Patient had a Band-Aid which was helpful and has not required any additional wound care during this hospitalization.  Patient will be referred to the outpatient for removing the sutures when it is needed.  Patient has no safety concerns throughout this hospitalization contract for safety at the time of discharge.  Patient is discharged to home with appropriate referral to the outpatient medication management and counseling services.  Patient and his mother is planning to seeking the residential  treatment center and looking for the 2 different centers in out of state at this time.  Patient has intensive in-home services and outpatient medication management.  Permission was granted from the guardian.  There were no major adverse effects from the medication.   Patient was able to verbalize reasons for his  living and appears to have a positive outlook toward his future.  A safety plan was discussed with him and his guardian.  He was provided with national suicide Hotline phone # 1-800-273-TALK as well as [facility]  number.  Patient medically stable  and baseline physical exam within normal limits with no abnormal findings. The patient appeared to benefit from the structure and consistency of the inpatient setting, continue current medication regimen and integrated therapies. During the  hospitalization patient gradually improved as evidenced by: Denied suicidal ideation, homicidal ideation, psychosis, depressive symptoms subsided.   He displayed an overall improvement in mood, behavior and affect. He was more cooperative and responded positively to redirections and limits set by the staff. The patient was able to verbalize age appropriate coping methods for use at home and school. At discharge conference was held during which findings, recommendations, safety plans and aftercare plan were discussed with the caregivers. Please refer to the therapist note for further information about issues discussed on family session. On discharge patients denied psychotic symptoms, suicidal/homicidal ideation, intention or plan and there was no evidence of manic or depressive symptoms.  Patient was discharge home on stable condition  Musculoskeletal: Strength & Muscle Tone: within normal limits Gait & Station: normal Patient leans: N/A   Psychiatric Specialty Exam:  Presentation  General Appearance:  Appropriate for Environment; Casual  Eye Contact: Good  Speech: Clear and Coherent  Speech Volume: Normal  Handedness: Right   Mood and Affect  Mood: Euthymic  Affect: Appropriate; Congruent   Thought Process  Thought Processes: Coherent; Goal Directed  Descriptions of Associations:Intact  Orientation:Full (Time, Place and Person)  Thought Content:Logical  History of Schizophrenia/Schizoaffective disorder:No data recorded Duration of Psychotic Symptoms:No data recorded Hallucinations:Hallucinations: None  Ideas of Reference:None  Suicidal Thoughts:Suicidal Thoughts: No SI Active Intent and/or Plan: Without Intent; Without Plan  Homicidal Thoughts:Homicidal Thoughts: No   Sensorium  Memory: Immediate Good; Recent Good; Remote Good  Judgment: Intact  Insight: Fair   Art therapist  Concentration: Good  Attention  Span: Good  Recall: Good  Fund of Knowledge: Good  Language: Good   Psychomotor Activity  Psychomotor Activity: Psychomotor Activity: Normal   Assets  Assets: Communication Skills; Desire for Improvement; Financial Resources/Insurance; Housing; Leisure Time; Transportation; Talents/Skills; Social Support; Physical Health   Sleep  Sleep: Sleep: Good Number of Hours of Sleep: 9    Physical Exam: Physical Exam ROS Blood pressure (!) 141/85, pulse 91, temperature 98.6 F (37 C), temperature source Oral, resp. rate 16, height 5' 4.57" (1.64 m), weight 95.7 kg, SpO2 100 %. Body mass index is 35.58 kg/m.   Social History   Tobacco Use  Smoking Status Never  Smokeless Tobacco Never   Tobacco Cessation:  N/A, patient does not currently use tobacco products   Blood Alcohol level:  Lab Results  Component Value Date   ETH <10 02/09/2023   ETH <10 12/28/2022    Metabolic Disorder Labs:  Lab Results  Component Value Date   HGBA1C 5.1 12/30/2022   MPG 99.67 12/30/2022   MPG 105 09/01/2022   Lab Results  Component Value Date   PROLACTIN 10.0 09/01/2022   PROLACTIN 10.3  04/16/2022   Lab Results  Component Value Date   CHOL 200 (H) 12/30/2022   TRIG 114 12/30/2022   HDL 40 (L) 12/30/2022   CHOLHDL 5.0 12/30/2022   VLDL 23 12/30/2022   LDLCALC 137 (H) 12/30/2022   LDLCALC 170 (H) 09/01/2022    See Psychiatric Specialty Exam and Suicide Risk Assessment completed by Attending Physician prior to discharge.  Discharge destination:  Home  Is patient on multiple antipsychotic therapies at discharge:  No   Has Patient had three or more failed trials of antipsychotic monotherapy by history:  No  Recommended Plan for Multiple Antipsychotic Therapies: NA  Discharge Instructions     Activity as tolerated - No restrictions   Complete by: As directed    Diet general   Complete by: As directed    Discharge instructions   Complete by: As directed     Discharge Recommendations:  The patient is being discharged with his family. Patient is to take his discharge medications as ordered.  See follow up above. We recommend that he participate in individual therapy to target self harm behaviors and needs safety monitoring We recommend that he participate in  family therapy to target the conflict with his family, to improve communication skills and conflict resolution skills.  Family is to initiate/implement a contingency based behavioral model to address patient's behavior. We recommend that he get AIMS scale, height, weight, blood pressure, fasting lipid panel, fasting blood sugar in three months from discharge as he's on atypical antipsychotics.  Patient will benefit from monitoring of recurrent suicidal ideation since patient is on antidepressant medication. The patient should abstain from all illicit substances and alcohol.  If the patient's symptoms worsen or do not continue to improve or if the patient becomes actively suicidal or homicidal then it is recommended that the patient return to the closest hospital emergency room or call 911 for further evaluation and treatment. National Suicide Prevention Lifeline 1800-SUICIDE or 539-791-4087. Please follow up with your primary medical doctor for all other medical needs.  The patient has been educated on the possible side effects to medications and he/his guardian is to contact a medical professional and inform outpatient provider of any new side effects of medication. He s to take regular diet and activity as tolerated.  Will benefit from moderate daily exercise. Family was educated about removing/locking any firearms, medications or dangerous products from the home.   No wound care   Complete by: As directed       Allergies as of 02/14/2023       Reactions   Egg-derived Products Anaphylaxis   Justicia Adhatoda Anaphylaxis, Other (See Comments)   All "TREE NUTS"   Peanut-containing Drug  Products Anaphylaxis        Medication List     TAKE these medications      Indication  albuterol 108 (90 Base) MCG/ACT inhaler Commonly known as: VENTOLIN HFA Inhale 1-2 puffs into the lungs every 4 (four) hours as needed for wheezing or shortness of breath.  Indication: Asthma   ARIPiprazole 15 MG tablet Commonly known as: ABILIFY Take 1 tablet (15 mg total) by mouth at bedtime. What changed:  medication strength how much to take  Indication: Major Depressive Disorder   busPIRone 10 MG tablet Commonly known as: BUSPAR Take 1 tablet (10 mg total) by mouth 2 (two) times daily.  Indication: Anxiety Disorder   EpiPen 2-Pak 0.3 mg/0.3 mL Soaj injection Generic drug: EPINEPHrine Inject 0.3 mg into the muscle as needed  for anaphylaxis (AS DIRECTED).  Indication: Life-Threatening Hypersensitivity Reaction   fluticasone 50 MCG/ACT nasal spray Commonly known as: FLONASE Place 1 spray into both nostrils 2 (two) times daily as needed for allergies or rhinitis.  Indication: Stuffy Nose   naltrexone 50 MG tablet Commonly known as: DEPADE Take 1 tablet (50 mg total) by mouth daily.  Indication: self harm and impulsivity.   omeprazole 40 MG capsule Commonly known as: PRILOSEC Take 40 mg by mouth daily before breakfast.  Indication: Heartburn   Pataday 0.1 % ophthalmic solution Generic drug: olopatadine Place 1 drop into both eyes 3 (three) times daily as needed for allergies.  Indication: dry eyes   propranolol 10 MG tablet Commonly known as: INDERAL Take 1 tablet (10 mg total) by mouth at bedtime.  Indication: Feeling Anxious   Testosterone Cypionate 200 MG/ML Soln Inject 80 mg into the skin every Sunday. What changed: when to take this  Indication: Transgender Man   traZODone 50 MG tablet Commonly known as: DESYREL Take 1 tablet (50 mg total) by mouth at bedtime as needed for sleep.  Indication: Trouble Sleeping   venlafaxine XR 150 MG 24 hr capsule Commonly  known as: EFFEXOR-XR Take 1 capsule (150 mg total) by mouth daily with breakfast.  Indication: Major Depressive Disorder   ZyrTEC Allergy 10 MG tablet Generic drug: cetirizine Take 1 tablet (10 mg total) by mouth daily as needed for allergies or rhinitis.  Indication: Hayfever        Follow-up Information     [facility] Follow up.   Why: You have an appointment with Dr. Marquis Lunch for medication management on 02/18/2023 at 11:30am.        [facility] Follow up.   Why: You have an appointment for therapy services on 02/18/2023 at 5:30pm. Contact information: [address] [place] [address] [phone]                 Follow-up recommendations:  Activity:  As tolerated Diet:  Regular  Comments:  Follow discharge instructions  Signed: Leata Mouse, MD 02/14/2023, 2:58 PM

## 2023-02-14 NOTE — [facility] Suicide Risk Assessment (Signed)
[facility] INPATIENT:  Family/Significant Other Suicide Prevention Education  Suicide Prevention Education:  Education Completed; [name], mother, [phone],  (name of family member/significant other) has been identified by the patient as the family member/significant other with whom the patient will be residing, and identified as the person(s) who will aid the patient in the event of a mental health crisis (suicidal ideations/suicide attempt).  With written consent from the patient, the family member/significant other has been provided the following suicide prevention education, prior to the and/or following the discharge of the patient.  The suicide prevention education provided includes the following: Suicide risk factors Suicide prevention and interventions National Suicide Hotline telephone number [facility] assessment telephone number [facility] Emergency Assistance 911 [facility] and/or Residential Mobile Crisis Unit telephone number  Request made of family/significant other to: Remove weapons (e.g., guns, rifles, knives), all items previously/currently identified as safety concern.   Remove drugs/medications (over-the-counter, prescriptions, illicit drugs), all items previously/currently identified as a safety concern.  The family member/significant other verbalizes understanding of the suicide prevention education information provided.  The family member/significant other agrees to remove the items of safety concern listed above.  Veva Holes, LCSWA 02/14/2023, 10:46 AM

## 2023-02-14 NOTE — [facility] Group Notes (Signed)
Child/Adolescent Psychoeducational Group Note  Date:  02/14/2023 Time:  12:03 AM  Group Topic/Focus:  Wrap-Up Group:   The focus of this group is to help patients review their daily goal of treatment and discuss progress on daily workbooks.  Participation Level:  Active  Participation Quality:  Appropriate, Attentive, and Sharing  Affect:  Depressed and Flat  Cognitive:  Alert, Appropriate, and Oriented  Insight:  Appropriate  Engagement in Group:  Engaged  Modes of Intervention:  Discussion and Support  Additional Comments:  pt shares his goal was to reflect on the reason(s) he got admitted.   Glorious Peach 02/14/2023, 12:03 AM

## 2023-02-14 NOTE — Progress Notes (Signed)
Discharge Note:  Patient denies SI/HI/AVH at this time. Discharge instructions, AVS, prescriptions, and transition recor gone over with patient. Patient agrees to comply with medication management, follow-up visit, and outpatient therapy. Patient belongings returned to patient. Patient questions and concerns addressed and answered. Patient ambulatory off unit. Patient discharged to home with Mother.   

## 2023-02-17 ENCOUNTER — Telehealth (INDEPENDENT_AMBULATORY_CARE_PROVIDER_SITE_OTHER): Payer: 59 | Admitting: Child and Adolescent Psychiatry

## 2023-02-17 DIAGNOSIS — F840 Autistic disorder: Secondary | ICD-10-CM | POA: Diagnosis not present

## 2023-02-17 DIAGNOSIS — F4010 Social phobia, unspecified: Secondary | ICD-10-CM

## 2023-02-17 DIAGNOSIS — F39 Unspecified mood [affective] disorder: Secondary | ICD-10-CM

## 2023-02-17 DIAGNOSIS — F649 Gender identity disorder, unspecified: Secondary | ICD-10-CM | POA: Diagnosis not present

## 2023-02-17 DIAGNOSIS — F411 Generalized anxiety disorder: Secondary | ICD-10-CM

## 2023-02-17 NOTE — Progress Notes (Signed)
Virtual Visit via Video Note  I connected with Antonio [name] on 02/17/23 at 11:30 AM EDT by a video enabled telemedicine application and verified that I am speaking with the correct person using two identifiers.  Location: Patient: home Provider: office   I discussed the limitations of evaluation and management by telemedicine and the availability of in person appointments. The patient expressed understanding and agreed to proceed.  I discussed the assessment and treatment plan with the patient. The patient was provided an opportunity to ask questions and all were answered. The patient agreed with the plan and demonstrated an understanding of the instructions.   The patient was advised to call back or seek an in-person evaluation if the symptoms worsen or if the condition fails to improve as anticipated.    Antonio Smalling, MD    [facility] MD/PA/NP OP Progress Note  02/17/2023 8:30 AM Antonio [name]  MRN:  [id number]  Chief Complaint: Medication management follow-up post discharge from recent hospitalization.  Synopsis: Antonio [name] "Antonio [name]" is an 17 year old assigned male at birth, identifying self as transgender male, prefers male pronouns he/him/his and name Antonio [name],  domiciled with biological mother and stepfather, rising 12th grader at [employer].    His psychiatric history significant of history of social anxiety disorder, major depressive disorder. He has hx of 6 psychiatric hospitalizations  in the context of suicide attempt by cutting, by OD on Wellbutrin XL 150 mg x 6 tablets, at [facility] between 06/03 to 06/12 for aggressive behaviors at therapist office and suicidal thoughts, at [facility] between 07/21 to 07/30 for OD on Ibuprofen 600 mg in a span of about three months. At [facility] for about a week in 10/2021 for cutting and then swallowing the blade. He was admitted at [facility] in 04/2022 after cutting self on the left wrist deep that required sutures on subcutaneous fat  layer and 9 sutures on the skin and last at [facility] for a week in 07/2022 for suicide attempt via cutting self deep enough that required 6 stitches.  He was admitted to [facility] for 3 days in December 2023 in the context of suicidal thoughts secondary to death of his dog who lived with him for 14 years. Then in 12/2022 for cutting self in the context of suicidal thoughts, cutting was deep enough that he required 4 stitches. He was last admitted at [facility] on 05/29 to 06/04   He was previously followed by Dr. Milana Kidney since 2019.  Patient was scheduled to see Dr. Milana Kidney after the discharge from [facility] however they requested to change the provider and made the appointment with this provider for medication management follow-up in 01/2020.   Past med trials -  Zoloft up to 150 mg once a day Pristiq up to 50 mg once a day Prozac up to 20 mg once a day and was discontinued because of vivid dreams during his stay at [facility].  Wellbutrin XL 150 mg was tried very briefly after the discharge from [facility] and was switched over to Effexor during his hospitalization at [facility].  Was on Effexor XR 225 mg daily, Guanfacine ER 2 mg at bedtime, Seroquel 25 mg in AM and 50 mg QHS, Naltrexone 50 mg Qdaily and Atarax 25 mg q6 hours PRN and was started on Seroquel 50 mg QHS and 25 mg q6hours as needed along with Trazodone 50 mg QHS.  At [facility] - his Lamictal was discontinued and Seroquel 25 mg was added, he  was also started on Propranolol.  At [facility], his effexor was changed to 75 mg daily, he was started on Latuda 40 mg daily, seroquel was discontinued, and trazodone/propranolol/guanfacine were continued.  Since then effexor was increased back to 150 mg daily for depression and Latuda was switched to Abiilify and abilify increased to 15 mg daily at the last appointment.   Pt was receiving therapy at [facility] in [place] for therapy after the discharge from [facility] and now seeing Ms. Normajean Glasgow at [place]  counseling.  He had a psychological evaluation for diagnostic clarification due to concerns for autism spectrum disorder in February 2022 and he was subsequently diagnosed with ASD.   Summary of psychological evaluation is as below: "Kloie was evaluated during February 2022 related to emotional regulation and social interaction difficulty.  Linzi presents with history of depressed mood, gender dysphoria and suicide attempts.  During hospitalization in psychotherapy, provider suggested that Williette be evaluated for autism spectrum disorder due to trouble interacting with others, problems reading nonverbal expressions, restricted patterns of interest, resistance to change, and sensory hypersensitivity.  Testing was recommended to evaluate for ASD along with other conditions that may be affecting tolerance emotional state and behavior.  Test results indicated above average overall intelligence(K-BIT 2) with average verbal comprehension and high nonverbal reasoning.  Neurocognitive testing indicated that Naziya appears to have age typical or better ability attending simple and complex information, shifting attention, mental tracking, memory unresponsiveness, with low typical ability to recognize facial expression.  Ratings for behavioral and emotional functioning indicated significant endorsement of traumatic stress, borderline personality traits and depression.  Borderline personality traits are often related to unresolved trauma.  While some endorsement of schizophrenia related to symptoms was endorsed(social attachment and disorganized thought), psychoticism (hallucinations and delusions were denied).  Testing for autism spectrum disorder indicated some difficulty with reciprocal social interaction with direct observation and restricted repetitive behavior.  Parent and self-report ratings indicated more difficulty in these areas, at the level that meets the criteria for ASD.  Recommendations include discussing  results with psychiatrist, continuing individual counseling, seeking parent behavioral consultation along with accessing appropriate community services.  DSM-V diagnoses include autism spectrum disorder level 1 needs support, MDD recurrent severe without psychotic features, PTSD."    HPI:   Antonio [name] was seen and evaluated over telemedicine encounter for medication management follow-up.  He was recently admitted to [facility] after he had cut himself, and required 17 stitches.  He was admitted for about a week and was discharged on June 4.  During the hospitalization his Abilify was increased to 15 mg daily while his other medications were continued.  Writer also saw this patient during the hospitalization on the weekend by providing weekend coverage on inpatient services at [facility].   He reported to this writer that during the hospitalization he woke up around 1:30 AM felt bad and had cut self with a pencil sharpener blade he took it from school. He says that in the past he has planned to cut self but did not have any specific date to cut self this time. He said that he did not remember the intent behind cutting and does not share any specific stressor that may have triggered the cutting. There were also reports of alters in the chart, so when asked about this during the hospitalization, he said that he is diagnosed with "Other Specified Dissociative disorder", he does not have alters but he has a part of his personality which wants  to relax and deal and enjoy his life and the other part of his personality which focuses on negative things then positive and he often ends up ruminating on it.  Today he reports that his rest of the hospitalization went well for him.  He says that he participated in group activities and one thing he learned was about cognitive distortions that he has often all or none thinking regarding his grades and that triggers the stress for him.  We discussed the ways to manage this cognitive distortions  and encouraged him to work on this with his therapist.  He reports that he has been doing well since the discharge from the hospital, yesterday had a graduation practice and it went well for him.  He says that his mood is around 7 out of 10, 10 being the best mood and denies any low lows or depressed mood since the discharge.  He denies any SI or self-harm behaviors such as cutting or thoughts of self-harm behaviors.  He denies any HI.  He says that he has been sleeping better now, still wakes up in the middle of the night but it does not take long for him to go back to sleep.  Does report some anxiety about graduation.  He says that he has not yet decided where he will be going for residential treatment but his mother is working on it and he is in agreement to focus on his mental health treatment.  He says that he has tolerated increased dose of Abilify well without any side effects.    His mother provides collateral information and confirms the history that led to his hospitalization.  Mother reports that patient was at school, called her and told her that he received 69 in his test, and was excited about it.  Half an hour later, when school calls her and tells her that they found him in the bathroom, cutting himself, and it was not deep enough so they put a dressing in he came home.  Early morning the next day, he cut himself again and told her subsequently she brought him to the hospital.  She expressed concerns regarding his safety and has been following up with safety precautions which were discussed previously and currently waiting for him to finish the graduation and get residential treatment at [facility] in .  Mother reports that he is accepted to the treatment program, had a bed available but then he got admitted to the hospital.  Mother has left a voicemail for Aundria Rud to return call and let her know when he will have a bed available there.  She reports that he seems to be doing well since the  discharge from the hospital.  We discussed to continue with current medications as Abilify was increased during the hospitalization.  Mother verbalized understanding and agreed with this plan.  We discussed to have another follow-up in 2 weeks if patient is still at home otherwise they can call and cancel it if he is admitted to [facility] behavioral health.  She verbalized understanding.  In the meantime they will  also continue with outpatient psychotherapy.     Visit Diagnosis:    ICD-10-CM   1. Mood disorder (HCC)  F39     2. Social anxiety disorder  F40.10     3. Gender dysphoria  F64.9     4. Autism spectrum disorder requiring support (level 1)  F84.0     5. Generalized anxiety disorder  F41.1  Past Psychiatric History: As mentioned in initial H&P, reviewed today, no change 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   Asthma    Bupropion overdose 01/16/2020   Complication of anesthesia    gets disoriented and shakes after surgery   Deliberate self-cutting 10/17/2018   Depression    Eczema    Fracture of 5th metatarsal    Gender dysphoria    Intentional overdose of drug in tablet form (HCC) 04/03/2020   MDD (major depressive disorder), recurrent severe, without psychosis (HCC) 11/02/2021   Moderate episode of recurrent major depressive disorder (HCC) 04/24/2020   Multiple allergies    Severe episode of recurrent major depressive disorder, without psychotic features (HCC) 01/02/2020   Suicidal ideations 08/31/2022   Suicide attempt by cutting of wrist (HCC) 07/28/2022    Past Surgical History:  Procedure Laterality Date   SUPPRELIN IMPLANT Left 01/22/2019   Procedure: SUPPRELIN IMPLANT;  Surgeon: Kandice Hams, MD;  Location: [facility];  Service: Pediatrics;  Laterality: Left;   SUPPRELIN REMOVAL N/A 03/03/2020   Procedure: SUPPRELIN  REMOVAL;  Surgeon: Kandice Hams, MD;  Location: [facility];  Service: Pediatrics;  Laterality: N/A;   TONSILLECTOMY     TYMPANOSTOMY TUBE PLACEMENT      Family Psychiatric History: As mentioned in initial H&P, reviewed today, no change  Family History:  Family History  Problem Relation Age of Onset   Depression Mother    Anxiety disorder Mother    Hypertension Maternal Grandmother    Allergic rhinitis Neg Hx    Angioedema Neg Hx    Atopy Neg Hx    Eczema Neg Hx    Immunodeficiency Neg Hx    Urticaria Neg Hx     Social History:  Social History   Socioeconomic History   Marital status: Single    Spouse name: Not on file   Number of children: Not on file   Years of education: Not on file   Highest education level: Not on file  Occupational History   Not on file  Tobacco Use   Smoking status: Never   Smokeless tobacco: Never  Vaping Use   Vaping Use: Former   Substances: Nicotine, Flavoring   Devices: Nicotine  Substance and Sexual Activity   Alcohol use: No   Drug use: Not Currently    Types: Marijuana   Sexual activity: Not Currently  Other Topics Concern   Not on file  Social History Narrative   Lives with mom and stepdad.      He is in 12th grade at [employer] 23-24 school year      He enjoys writing and listen to music, writes poetry   Social Determinants of Health   Financial Resource Strain: Not on file  Food Insecurity: Not on file  Transportation Needs: Not on file  Physical Activity: Not on file  Stress: Not on file  Social Connections: Not on file    Allergies:  Allergies  Allergen Reactions   Egg-Derived Products Anaphylaxis   Justicia Adhatoda Anaphylaxis and Other (See Comments)    All "TREE NUTS"   Peanut-Containing Drug Products Anaphylaxis    Metabolic Disorder Labs: Lab Results  Component Value Date   HGBA1C 5.1 12/30/2022   MPG 99.67 12/30/2022   MPG 105 09/01/2022   Lab Results  Component Value  Date   PROLACTIN 10.0 09/01/2022  PROLACTIN 10.3 04/16/2022   Lab Results  Component Value Date   CHOL 200 (H) 12/30/2022   TRIG 114 12/30/2022   HDL 40 (L) 12/30/2022   CHOLHDL 5.0 12/30/2022   VLDL 23 12/30/2022   LDLCALC 137 (H) 12/30/2022   LDLCALC 170 (H) 09/01/2022   Lab Results  Component Value Date   TSH 4.391 12/30/2022   TSH 2.451 09/01/2022    Therapeutic Level Labs: No results found for: "LITHIUM" No results found for: "VALPROATE" No results found for: "CBMZ"  Current Medications: Current Outpatient Medications  Medication Sig Dispense Refill   albuterol (VENTOLIN HFA) 108 (90 Base) MCG/ACT inhaler Inhale 1-2 puffs into the lungs every 4 (four) hours as needed for wheezing or shortness of breath.     ARIPiprazole (ABILIFY) 15 MG tablet Take 1 tablet (15 mg total) by mouth at bedtime. 30 tablet 0   busPIRone (BUSPAR) 10 MG tablet Take 1 tablet (10 mg total) by mouth 2 (two) times daily. 60 tablet 0   EPINEPHrine (EPIPEN 2-PAK) 0.3 mg/0.3 mL IJ SOAJ injection Inject 0.3 mg into the muscle as needed for anaphylaxis (AS DIRECTED).     fluticasone (FLONASE) 50 MCG/ACT nasal spray Place 1 spray into both nostrils 2 (two) times daily as needed for allergies or rhinitis.     naltrexone (DEPADE) 50 MG tablet Take 1 tablet (50 mg total) by mouth daily. 60 tablet 0   omeprazole (PRILOSEC) 40 MG capsule Take 40 mg by mouth daily before breakfast.     PATADAY 0.1 % ophthalmic solution Place 1 drop into both eyes 3 (three) times daily as needed for allergies.     propranolol (INDERAL) 10 MG tablet Take 1 tablet (10 mg total) by mouth at bedtime. 30 tablet 0   Testosterone Cypionate 200 MG/ML SOLN Inject 80 mg into the skin every Sunday. (Patient taking differently: Inject 80 mg into the skin every Wednesday.) 4 mL 1   traZODone (DESYREL) 50 MG tablet Take 1 tablet (50 mg total) by mouth at bedtime as needed for sleep. 30 tablet 0   venlafaxine XR (EFFEXOR-XR) 150 MG 24 hr  capsule Take 1 capsule (150 mg total) by mouth daily with breakfast. 30 capsule 0   ZYRTEC ALLERGY 10 MG tablet Take 1 tablet (10 mg total) by mouth daily as needed for allergies or rhinitis. 30 tablet 0   No current facility-administered medications for this visit.     Musculoskeletal: Strength & Muscle Tone: unable to assess since visit was over the telemedicine. Gait & Station: unable to assess since visit was over the telemedicine. Patient leans: N/A    Psychiatric Specialty Exam: Review of Systems Review of 12 systems negative except as mentioned in HPI   There were no vitals taken for this visit.There is no height or weight on file to calculate BMI.  General Appearance: Casual  Eye Contact:  Good  Speech:  Normal Rate  Volume:  Normal  Mood:  "good..."  Affect:  Appropriate, Congruent, and Full Range  Thought Process:  Goal Directed and Linear  Orientation:  Full (Time, Place, and Person)  Thought Content: Logical   Suicidal Thoughts:  No  Homicidal Thoughts:  No  Memory:  Immediate;   Good Recent;   Good Remote;   Good  Judgement:  Fair  Insight:  Fair  Psychomotor Activity:  Normal  Concentration:  Concentration: Fair and Attention Span: Fair  Recall:  [employer] of Knowledge: Fair  Language: Fair  Akathisia:  No  AIMS (if indicated): not done  Assets:  Communication Skills Desire for Improvement Financial Resources/Insurance Housing Leisure Time Physical Health Social Support Transportation Vocational/Educational  ADL's:  Intact  Cognition: WNL  Sleep:   Fair    Screenings:   Assessment and Plan:   17 year old AF[employer]  identifies as transgender male and prefers pronoun he/him/his. -  He is genetically predisposed to depression and anxiety disorders. -  His initial presentation was most consistent with MDD, gender dysphoria, anxiety disorders. -  He had one episode lasting for about 2 days that was most consistent with hypomanic symptoms, therefore  diagnostic impression at present is bipolar 2 disorder vs MDD.  -  In addition to this he also presents with borderline personality traits due to his chronic SI, chronic intermittent self harm behaviors, impulsive behaviors, affective instability, anger, and problems with interpersonal relationship.  -  Pt eluded to some emotional abuse which appears to have predisposed him to mental health issues in addition to his genetic predisposition.  -  His cognitive distortions such as polarized thinking, filtering out any positives and staying focused on negative appears to contribute to his distress and he continues to exhibit low distress tolerance despite weekly ind therapy(DBT) - He had psychological evaluation on which he was diagnosed with Autism Spectrum Disorder, MDD, PTSD.   Update on 02/17/23   - He presents for follow-up after about 3 weeks, was admitted to [facility] for cutting that required 17 sutures.  - Says that he cannot identify specific trigger that lead to cutting, but most likely it appears to be in the context of end of school, graduation stressors and seeing his other friends moving on after the college.  - At present he denies any symptoms consistent with depression, mania, psychosis and apepars to have full affect.  - He currently denies any SI.  - He is accepted at [facility] in [place] for RTC treatment and mother has reached out to them to find out when he can go there to start receive treatment.  - They will follow up again in 2 weeks if he is not admitted to RTC.  - Parents are supportive, accepting and supporting his gender identity as male, and he is receiving hormonal treatment at this time, he is also future oriented and these all appears to be protective factors for him.  - Mother continues to follow safety precautions at home.      Plan:   # mood(chronic/partially better)/Anxiety(chronic and unstable) -Continue Effexor XR 150 mg once a day. -Continue with Abilify 15 mg  daily at bedtime. -Continue with Buspar 10 mg twice daily.  -Continue with Atarax 25 mg q6hrs PRN for anxiety and Seroquel 25 mg Q6hrs PRN for agitation/anxiety - Continue Trazodone to 50-100 mg QHS PRN for sleeping difficulties.  -  Continue ind therapy with Ms. Normajean Glasgow, appears to have developed good therapeutic relationship and also attended on family therapy sessions.  .  -Continue propranolol 10 mg daily at bedtime  # Gender Dysphoria  - Defer management to his current endocrinologist.  - Continue with therapy as above.   # Tics (improved) - Intuniv 2 mg stopped in 07/2022  # Self harm behaviors (improving, and none recently per his report) - Continue with Naltrexone 50 mg daily.     A suicide and violence risk assessment was performed as part of this evaluation. The patient is deemed to be at chronic elevated risk for self-harm/suicide given the following factors: current diagnosis of Mood Disorder, ASD,  Anxiety disorder, gender dysphoria and past hx of suicidal attempts/suicidalthougts/non suicidal self harm behaviors. The patient is deemed to be at chronic elevated risk for violence given the following factors: younger age. These risk factors are mitigated by the following factors:lack of active SI/HI, no known naccess to weapons or firearms, motivation for treatment, utilization of positive coping skills, supportive family, presence of an available support system, employment or functioning in a structured work/academic setting, current treatment compliance, safe housing and support system in agreement with treatment recommendations. There is no acute risk for suicide or violence at this time. The patient was educated about relevant modifiable risk factors including following recommendations for treatment of psychiatric illness and abstaining from substance abuse. While future psychiatric events cannot be accurately predicted, the patient does not request acute inpatient psychiatric  care and does not currently meet [facility] involuntary commitment criteria.      He has a follow up appointment in 2 weeks or early if symptoms worsens.   40 minutes total time for encounter today which included chart review, pt evaluation, collaterals, medication and other treatment discussions, medication orders and charting.     This note was generated in part or whole with voice recognition software. Voice recognition is usually quite accurate but there are transcription errors that can and very often do occur. I apologize for any typographical errors that were not detected and corrected.             Antonio Smalling, MD 02/17/2023, 12:59 PM

## 2023-02-21 ENCOUNTER — Ambulatory Visit (HOSPITAL_COMMUNITY)
Admission: EM | Admit: 2023-02-21 | Discharge: 2023-02-21 | Disposition: A | Discharge status: Home / Self Care | Payer: 59

## 2023-02-21 DIAGNOSIS — Z4802 Encounter for removal of sutures: Secondary | ICD-10-CM | POA: Diagnosis not present

## 2023-02-21 DIAGNOSIS — S41112D Laceration without foreign body of left upper arm, subsequent encounter: Secondary | ICD-10-CM | POA: Diagnosis not present

## 2023-02-21 NOTE — ED Triage Notes (Signed)
Pt is here for a wound check on his left forearm. Denies any pain at this time.

## 2023-02-21 NOTE — Discharge Instructions (Addendum)
You can wash the arm normally now that sutures are removed Please monitor for any signs of infection  You can apply lotion or Aquaphor to help skin healing

## 2023-02-21 NOTE — ED Provider Notes (Signed)
[facility]    CSN: [id number] Arrival date & time: 02/21/23  1517      History   Chief Complaint Chief Complaint  Patient presents with   Suture / Staple Removal    HPI Antonio [name] is a 17 y.o. adult.  Here for suture removal Seen in the ED 5/29 after self-inflicted wound to left wrist. Had 17 sutures placed Reports some of them fell out Not having swelling, pain, drainage. 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   Asthma    Bupropion overdose 01/16/2020   Complication of anesthesia    gets disoriented and shakes after surgery   Deliberate self-cutting 10/17/2018   Depression    Eczema    Fracture of 5th metatarsal    Gender dysphoria    Intentional overdose of drug in tablet form (HCC) 04/03/2020   MDD (major depressive disorder), recurrent severe, without psychosis (HCC) 11/02/2021   Moderate episode of recurrent major depressive disorder (HCC) 04/24/2020   Multiple allergies    Severe episode of recurrent major depressive disorder, without psychotic features (HCC) 01/02/2020   Suicidal ideations 08/31/2022   Suicide attempt by cutting of wrist (HCC) 07/28/2022    Patient Active Problem List   Diagnosis Date Noted   Suicidal ideations 02/09/2023   MDD (major depressive disorder), recurrent severe, without psychosis (HCC) 01/02/2023   Autism spectrum disorder requiring support (level 1) 05/01/2021   Acne vulgaris 07/31/2020   Breast lump on right side at 5 o'clock position 09/27/2019   Gastroesophageal reflux disease 09/27/2019   Insulin resistance 10/17/2018   Deliberate self-cutting 10/17/2018   Social anxiety disorder 10/17/2018   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 10/17/2018   Gender dysphoria    Intrinsic atopic dermatitis 06/06/2015   Asthma 08/23/2011    Class: Diagnosis of    Past Surgical History:   Procedure Laterality Date   SUPPRELIN IMPLANT Left 01/22/2019   Procedure: SUPPRELIN IMPLANT;  Surgeon: Kandice Hams, MD;  Location: [facility];  Service: Pediatrics;  Laterality: Left;   SUPPRELIN REMOVAL N/A 03/03/2020   Procedure: SUPPRELIN REMOVAL;  Surgeon: Kandice Hams, MD;  Location: [facility];  Service: Pediatrics;  Laterality: N/A;   TONSILLECTOMY     TYMPANOSTOMY TUBE PLACEMENT      OB History   No obstetric history on file.      Home Medications    Prior to Admission medications   Medication Sig Start Date End Date Taking? Authorizing Provider  albuterol (VENTOLIN HFA) 108 (90 Base) MCG/ACT inhaler Inhale 1-2 puffs into the lungs every 4 (four) hours as needed for wheezing or shortness of breath. 08/11/22  Yes [provider]  ARIPiprazole (ABILIFY) 15 MG tablet Take 1 tablet (15 mg total) by mouth at bedtime. 02/14/23  Yes Leata Mouse, MD  busPIRone (BUSPAR) 10 MG tablet Take 1 tablet (10 mg total) by mouth 2 (two) times daily. 02/14/23  Yes Leata Mouse, MD  EPINEPHrine (EPIPEN 2-PAK) 0.3 mg/0.3 mL IJ SOAJ injection Inject 0.3 mg into the muscle as needed for anaphylaxis (AS DIRECTED).   Yes [provider]  fluticasone (FLONASE) 50 MCG/ACT nasal spray Place 1 spray into both nostrils 2 (two) times daily as needed for allergies or rhinitis.   Yes [provider]  PATADAY 0.1 % ophthalmic solution Place 1 drop into both eyes 3 (three) times daily  as needed for allergies.   Yes [provider]  propranolol (INDERAL) 10 MG tablet Take 1 tablet (10 mg total) by mouth at bedtime. 02/14/23  Yes Leata Mouse, MD  Testosterone Cypionate 200 MG/ML SOLN Inject 80 mg into the skin every Sunday. Patient taking differently: Inject 80 mg into the skin every Wednesday. 11/14/22  Yes Dessa Phi, MD  traZODone (DESYREL) 50 MG tablet Take 1 tablet (50 mg total) by mouth at bedtime as  needed for sleep. 02/14/23  Yes Leata Mouse, MD  venlafaxine XR (EFFEXOR-XR) 150 MG 24 hr capsule Take 1 capsule (150 mg total) by mouth daily with breakfast. 02/14/23  Yes Leata Mouse, MD  ZYRTEC ALLERGY 10 MG tablet Take 1 tablet (10 mg total) by mouth daily as needed for allergies or rhinitis. 02/14/23  Yes Leata Mouse, MD  naltrexone (DEPADE) 50 MG tablet Take 1 tablet (50 mg total) by mouth daily. 01/27/23   [name], MD  omeprazole (PRILOSEC) 40 MG capsule Take 40 mg by mouth daily before breakfast.    [provider]    Family History Family History  Problem Relation Age of Onset   Depression Mother    Anxiety disorder Mother    Hypertension Maternal Grandmother    Allergic rhinitis Neg Hx    Angioedema Neg Hx    Atopy Neg Hx    Eczema Neg Hx    Immunodeficiency Neg Hx    Urticaria Neg Hx     Social History Social History   Tobacco Use   Smoking status: Never   Smokeless tobacco: Never  Vaping Use   Vaping Use: Former   Substances: Nicotine, Flavoring   Devices: Nicotine  Substance Use Topics   Alcohol use: No   Drug use: Not Currently    Types: Marijuana     Allergies   Egg-derived products, Justicia adhatoda, and Peanut-containing drug products   Review of Systems Review of Systems As per HPI  Physical Exam Triage Vital Signs ED Triage Vitals [02/21/23 1620]  Enc Vitals Group     BP 127/83     Pulse Rate 87     Resp 18     Temp 98.3 F (36.8 C)     Temp Source Oral     SpO2 98 %     Weight      Height      Head Circumference      Peak Flow      Pain Score      Pain Loc      Pain Edu?      Excl. in GC?    No data found.  Updated Vital Signs BP 127/83 (BP Location: Left Arm)   Pulse 87   Temp 98.3 F (36.8 C) (Oral)   Resp 18   LMP  (LMP Unknown)   SpO2 98%    Physical Exam Vitals and nursing note reviewed.  Constitutional:      General: He is not in acute distress.    Appearance:  He is not ill-appearing.  HENT:     Mouth/Throat:     Pharynx: Oropharynx is clear.  Cardiovascular:     Rate and Rhythm: Normal rate and regular rhythm.     Pulses: Normal pulses.  Pulmonary:     Effort: Pulmonary effort is normal.  Musculoskeletal:        General: Signs of injury present. No swelling or tenderness. Normal range of motion.  Skin:    General: Skin is warm  and dry.     Comments: Scar tissue on the left inner arm. Healing V shaped laceration. There are 12 sutures visualized. No drainage, swelling, erythema.   Neurological:     Mental Status: He is alert and oriented to person, place, and time.     UC Treatments / Results  Labs (all labs ordered are listed, but only abnormal results are displayed) Labs Reviewed - No data to display  EKG  Radiology No results found.  Procedures Procedures   Medications Ordered in UC Medications - No data to display  Initial Impression / Assessment and Plan / UC Course  I have reviewed the triage vital signs and the nursing notes.  Pertinent labs & imaging results that were available during my care of the patient were reviewed by me and considered in my medical decision making (see chart for details).  12 sutures removed by [facility] CMA No signs of infection Discussed signs to return for. No questions at this time  Final Clinical Impressions(s) / UC Diagnoses   Final diagnoses:  Arm laceration, left, subsequent encounter  Visit for suture removal     Discharge Instructions      You can wash the arm normally now that sutures are removed Please monitor for any signs of infection  You can apply lotion or Aquaphor to help skin healing      ED Prescriptions   None    PDMP not reviewed this encounter.   Marlow Baars, [place] 02/21/23 [id number]

## 2023-03-07 ENCOUNTER — Telehealth: Payer: 59 | Admitting: Child and Adolescent Psychiatry

## 2023-03-09 ENCOUNTER — Other Ambulatory Visit (HOSPITAL_COMMUNITY): Payer: Self-pay | Admitting: Psychiatry

## 2023-03-18 ENCOUNTER — Encounter (INDEPENDENT_AMBULATORY_CARE_PROVIDER_SITE_OTHER): Payer: Self-pay

## 2023-03-21 ENCOUNTER — Other Ambulatory Visit (HOSPITAL_COMMUNITY): Payer: Self-pay | Admitting: Psychiatry

## 2023-03-22 ENCOUNTER — Telehealth: Payer: Self-pay | Admitting: Child and Adolescent Psychiatry

## 2023-03-22 NOTE — Telephone Encounter (Signed)
Mom called stating Antonio [name] will be out of [facility] by the end of July. They are talking about a PHP program. Do you have suggestions? Please call her to advise.

## 2023-03-22 NOTE — Telephone Encounter (Signed)
Spoke with mother over the phone, discussed PHP options in the [place] area, and mother will reach out to [employer] [facility]. She reported that pt is doing better, they have continued the same medications and he is planning to complete 6 weeks stay which will be until the end of July.

## 2023-04-11 ENCOUNTER — Telehealth (INDEPENDENT_AMBULATORY_CARE_PROVIDER_SITE_OTHER): Payer: 59 | Admitting: Child and Adolescent Psychiatry

## 2023-04-11 DIAGNOSIS — F649 Gender identity disorder, unspecified: Secondary | ICD-10-CM

## 2023-04-11 DIAGNOSIS — F840 Autistic disorder: Secondary | ICD-10-CM

## 2023-04-11 DIAGNOSIS — F411 Generalized anxiety disorder: Secondary | ICD-10-CM | POA: Diagnosis not present

## 2023-04-11 DIAGNOSIS — F39 Unspecified mood [affective] disorder: Secondary | ICD-10-CM | POA: Diagnosis not present
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
## 2023-04-13 ENCOUNTER — Encounter (INDEPENDENT_AMBULATORY_CARE_PROVIDER_SITE_OTHER): Payer: Self-pay | Admitting: Pediatric Endocrinology

## 2023-04-13 ENCOUNTER — Ambulatory Visit (INDEPENDENT_AMBULATORY_CARE_PROVIDER_SITE_OTHER): Payer: 59 | Admitting: Pediatric Endocrinology

## 2023-04-13 VITALS — BP 118/84 | HR 121 | Ht 65.55 in | Wt 227.0 lb

## 2023-04-13 DIAGNOSIS — F329 Major depressive disorder, single episode, unspecified: Secondary | ICD-10-CM | POA: Diagnosis not present

## 2023-04-13 DIAGNOSIS — F419 Anxiety disorder, unspecified: Secondary | ICD-10-CM | POA: Diagnosis not present

## 2023-04-13 DIAGNOSIS — Z9151 Personal history of suicidal behavior: Secondary | ICD-10-CM

## 2023-04-13 DIAGNOSIS — F649 Gender identity disorder, unspecified: Secondary | ICD-10-CM | POA: Diagnosis not present

## 2023-04-13 LAB — CBC
HCT: 40.1 % (ref 36.0–49.0)
Hemoglobin: 13.1 g/dL (ref 12.0–16.9)
MCH: 24.4 pg — ABNORMAL LOW (ref 25.0–35.0)
MCHC: 32.7 g/dL (ref 31.0–36.0)
MCV: 74.7 fL — ABNORMAL LOW (ref 78.0–98.0)
MPV: 10.2 fL (ref 7.5–12.5)
Platelets: 279 10*3/uL (ref 140–400)
RBC: 5.37 10*6/uL (ref 4.10–5.70)
RDW: 14.5 % (ref 11.0–15.0)
WBC: 5.3 10*3/uL (ref 4.5–13.0)

## 2023-04-13 NOTE — Patient Instructions (Signed)
[employer] packing boxers etc.   Stand to [facility]  Dr. Nancie Neas, [facility] for bottom surgery

## 2023-04-13 NOTE — Progress Notes (Signed)
Pediatric Endocrinology Consultation Visit  Antonio [name] [birth year] [id number]  Preferred Name: Antonio Preferred Pronouns: he/him/his  Chief Complaint: gender dysphoria  HPI: Antonio [name] is a 17 y.o. assigned male at birth presenting for evaluation and management of gender dysphoria. They are seen at behavioral health for MDD, social anxiety, and autism. He also has mixed hyperlipidemia managed with diet.     2. Antonio was last seen in pediatric endocrine clinic on 10/13/22. In the interim he has graduated HS. He did have some issues with cutting about 2 months ago and was in residential treatment for about a month. He feels good now and wants to move forward with top surgery. He says that his insurance recently approved the surgery. He is working with Dr. Evaristo Bury in [facility].   He has continued on Testosterone Cypionate at 80 mg (40 units) every week. His mom is still prepping the syringe for him but he is giving his own injections. He is taking his shots on Wednesdays. He has not yet had his injection today.   No issues with injections.   He has had several ED visits and another 2 admissions for behavioral health/SI concerns since last visit. He will be doing PHP (partial hospitalization program) this summer/fall. This is starting on Friday. He will be at a day program week days from 9-3.   He is still growing facial hair. He has a nice beard growing in.   He has had a legal name change.   He is planning to do both top and bottom surgery in the future. He feels that of the two surgeries top surgery is a NEED while bottom surgery is a WANT.   He is binding nearly every day- depending on activity He is taking off a day on the weekends. He has not used trans tape.   No intimate partners.   He is not currently working out but plans to.   He is also getting therapy at [facility].   ----------------------------------------  Review of records showed history of cutting with admission  to [facility] 10/27/21 for suicide attempt via ingestion of razor blade with arm laceration requiring suturing, PTSD, eating disorder, MDD, anxiety, leading to admission to behavioral health 11/02/21. There was a previous suicide attempt May 2021 when he was admitted to [facility] due to intentional overdose of Buproprion.   Gender affirming care was continued during the admission with Testosterone 80mg  every Sunday. Mixed hyperlipidemia was also noted on admission.   3. ROS: Greater than 10 systems reviewed with pertinent positives listed in HPI, otherwise neg.  Past Medical History:   Past Medical History:  Diagnosis Date   Allergic rhinoconjunctivitis 06/06/2015   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   Asthma    Bupropion overdose 01/16/2020   Complication of anesthesia    gets disoriented and shakes after surgery   Deliberate self-cutting 10/17/2018   Depression    Eczema    Fracture of 5th metatarsal    Gender dysphoria    Intentional overdose of drug in tablet form (HCC) 04/03/2020   MDD (major depressive disorder), recurrent severe, without psychosis (HCC) 11/02/2021   Moderate episode of recurrent major depressive disorder (HCC) 04/24/2020   Multiple allergies    Severe episode of recurrent major depressive disorder, without psychotic features (HCC) 01/02/2020   Suicidal ideations 08/31/2022   Suicide attempt by cutting of wrist (HCC) 07/28/2022    Meds: Outpatient Encounter Medications as of 04/13/2023  Medication Sig   albuterol (  VENTOLIN HFA) 108 (90 Base) MCG/ACT inhaler Inhale 1-2 puffs into the lungs every 4 (four) hours as needed for wheezing or shortness of breath.   ARIPiprazole (ABILIFY) 15 MG tablet Take 1 tablet (15 mg total) by mouth at bedtime.   busPIRone (BUSPAR) 10 MG tablet Take 1 tablet (10 mg total) by mouth 2 (two) times daily.   EPINEPHrine (EPIPEN 2-PAK) 0.3 mg/0.3 mL IJ SOAJ injection Inject 0.3 mg into the muscle as needed for anaphylaxis (AS DIRECTED).    naltrexone (DEPADE) 50 MG tablet Take 1 tablet (50 mg total) by mouth daily.   omeprazole (PRILOSEC) 40 MG capsule Take 40 mg by mouth daily before breakfast.   PATADAY 0.1 % ophthalmic solution Place 1 drop into both eyes 3 (three) times daily as needed for allergies.   propranolol (INDERAL) 10 MG tablet Take 1 tablet (10 mg total) by mouth at bedtime.   Testosterone Cypionate 200 MG/ML SOLN Inject 80 mg into the skin every Sunday. (Patient taking differently: Inject 80 mg into the skin every Wednesday.)   traZODone (DESYREL) 50 MG tablet Take 1 tablet (50 mg total) by mouth at bedtime as needed for sleep.   venlafaxine XR (EFFEXOR-XR) 150 MG 24 hr capsule Take 1 capsule (150 mg total) by mouth daily with breakfast.   [DISCONTINUED] fluticasone (FLONASE) 50 MCG/ACT nasal spray Place 1 spray into both nostrils 2 (two) times daily as needed for allergies or rhinitis.   [DISCONTINUED] ZYRTEC ALLERGY 10 MG tablet Take 1 tablet (10 mg total) by mouth daily as needed for allergies or rhinitis.   No facility-administered encounter medications on file as of 04/13/2023.    Allergies: Allergies  Allergen Reactions   Egg-Derived Products Anaphylaxis   Justicia Adhatoda Anaphylaxis and Other (See Comments)    All "TREE NUTS"   Peanut-Containing Drug Products Anaphylaxis    Surgical History: Past Surgical History:  Procedure Laterality Date   SUPPRELIN IMPLANT Left 01/22/2019   Procedure: SUPPRELIN IMPLANT;  Surgeon: Kandice Hams, MD;  Location: [facility];  Service: Pediatrics;  Laterality: Left;   SUPPRELIN REMOVAL N/A 03/03/2020   Procedure: SUPPRELIN REMOVAL;  Surgeon: Kandice Hams, MD;  Location:  SURGERY CENTER;  Service: Pediatrics;  Laterality: N/A;   TONSILLECTOMY     TYMPANOSTOMY TUBE PLACEMENT       Family History: No MI/CVA/heart disease in <55 years. Family History  Problem Relation Age of Onset   Depression Mother    Anxiety disorder Mother     Hypertension Maternal Grandmother    Allergic rhinitis Neg Hx    Angioedema Neg Hx    Atopy Neg Hx    Eczema Neg Hx    Immunodeficiency Neg Hx    Urticaria Neg Hx     Social History: Social History   Social History Narrative   Lives with mom and stepdad.      He is in 12th grade at [employer] 23-24 school year      He enjoys writing and listen to music, writes poetry     Physical Exam:  Vitals:   04/13/23 1455  BP: 118/84  Pulse: (!) 121  Weight: 227 lb (103 kg)  Height: 5' 5.55" (1.665 m)   BP 118/84   Pulse (!) 121   Ht 5' 5.55" (1.665 m)   Wt 227 lb (103 kg)   BMI 37.14 kg/m  Body mass index: body mass index is 37.14 kg/m. Blood pressure %iles are not available for patients who  are 18 years or older.  Wt Readings from Last 3 Encounters:  04/13/23 227 lb (103 kg) (98%, Z= 2.05)*  01/08/23 200 lb (90.7 kg) (94%, Z= 1.52)*  12/28/22 202 lb 13.2 oz (92 kg) (94%, Z= 1.59)*   * Growth percentiles are based on CDC (Boys, 2-20 Years) data.   Ht Readings from Last 3 Encounters:  04/13/23 5' 5.55" (1.665 m) (8%, Z= -1.40)*  01/08/23 5\' 5"  (1.651 m) (6%, Z= -1.57)*  12/28/22 5\' 5"  (1.651 m) (6%, Z= -1.57)*   * Growth percentiles are based on CDC (Boys, 2-20 Years) data.    Physical Exam Vitals reviewed.  Constitutional:      Appearance: Normal appearance. He is not toxic-appearing.  HENT:     Head: Normocephalic and atraumatic.  Eyes:     Extraocular Movements: Extraocular movements intact.  Neck:     Comments: NO goiter Cardiovascular:     Rate and Rhythm: Normal rate and regular rhythm.     Pulses: Normal pulses.     Heart sounds: Normal heart sounds. No murmur heard. Pulmonary:     Effort: Pulmonary effort is normal. No respiratory distress.     Breath sounds: Normal breath sounds.  Chest:     Comments: Wearing binder Abdominal:     General: There is no distension.     Comments: No hepatomegaly  Musculoskeletal:        General: Normal  range of motion.     Cervical back: Normal range of motion and neck supple.  Skin:    Capillary Refill: Capillary refill takes less than 2 seconds.     Findings: No rash.          Comments: Mild-moderate acanthosis Large scar area on left wrist with superficial as well as deep lines   Neurological:     General: No focal deficit present.     Mental Status: He is alert.     Gait: Gait normal.  Psychiatric:        Mood and Affect: Mood normal.        Behavior: Behavior normal.        Thought Content: Thought content normal.        Judgment: Judgment normal.      Labs:  Lab Results  Component Value Date   TESTOSTERONE 830 (H) 10/13/2022   TESTOSTERONE 296 (H) 05/18/2022   TESTOSTERONE 408 (H) 04/16/2022   TESTOSTERONE 394 (H) 11/30/2021   TESTOSTERONE 192 (H) 11/08/2019   TESTOSTERONE 239 (H) 10/02/2019      Assessment/Plan: Antonio [name] is a 17 y.o. assigned at birth male who identifies as transmasculine, which is consistent with a diagnosis of gender dysphoria with associated major depressive disorder, anxiety, and history of suicide attempts and residential psychiatric care.   1. Gender dysphoria -stable on current dose -T consent signed with family in July 2023 - New consent completed when he turned 16 -Continue testosterone cypionate 80mg  SQ Qweekly (40 units) - Testosterone level today (trough- he is due for dose today) - Had consult with breast surgeon for top surgery- and was approved by insurance - [facility] surgical letter provided today  Discussed that I will be leaving [facility] this fall. He has already established for primary care and gender care with Dr. Jennette Kettle at [facility]. He will continue to follow there moving forward.    Lab Orders         CBC         Comprehensive metabolic panel  Testosterone, Total, LC/MS/MS       Follow-up:   Return for with Family medicine, Dr. Jennette Kettle.   Medical decision-making:  >40 minutes spent today  reviewing the medical chart, counseling the patient/family, and documenting today's encounter.    Thank you for the opportunity to participate in the care of your patient. Please do not hesitate to contact me should you have any questions regarding the assessment or treatment plan.   Sincerely,   Dessa Phi, MD

## 2023-04-19 ENCOUNTER — Telehealth: Payer: 59 | Admitting: Child and Adolescent Psychiatry

## 2023-05-10 ENCOUNTER — Other Ambulatory Visit (HOSPITAL_COMMUNITY): Payer: Self-pay | Admitting: Psychiatry

## 2023-05-10 ENCOUNTER — Telehealth: Payer: Self-pay

## 2023-05-10 MED ORDER — ARIPIPRAZOLE 15 MG PO TABS: 15.0000 mg | ORAL_TABLET | Freq: Every day | ORAL | 0 refills | Status: DC

## 2023-05-10 NOTE — Telephone Encounter (Signed)
Mother called to request a refill of   ARIPiprazole (ABILIFY) 15 MG tablet   Last visit 04/11/23 Next visit 05/19/23  Ordered On: 02/14/2023   Preferred pharmacy  [id number] - [place], [place] - [address] AT [facility] [address] Phone: [phone]  Fax: [phone]

## 2023-05-10 NOTE — Telephone Encounter (Signed)
sent 

## 2023-05-11 ENCOUNTER — Ambulatory Visit: Payer: 59 | Admitting: Family Medicine

## 2023-05-16 ENCOUNTER — Other Ambulatory Visit: Payer: Self-pay | Admitting: Child and Adolescent Psychiatry

## 2023-05-19 ENCOUNTER — Telehealth (INDEPENDENT_AMBULATORY_CARE_PROVIDER_SITE_OTHER): Payer: 59 | Admitting: Child and Adolescent Psychiatry

## 2023-05-19 DIAGNOSIS — F649 Gender identity disorder, unspecified: Secondary | ICD-10-CM

## 2023-05-19 DIAGNOSIS — F39 Unspecified mood [affective] disorder: Secondary | ICD-10-CM | POA: Diagnosis not present

## 2023-05-19 DIAGNOSIS — F840 Autistic disorder: Secondary | ICD-10-CM

## 2023-05-19 DIAGNOSIS — F4010 Social phobia, unspecified: Secondary | ICD-10-CM | POA: Diagnosis not present

## 2023-05-19 MED ORDER — NALTREXONE HCL 50 MG PO TABS: 50.0000 mg | ORAL_TABLET | Freq: Every day | ORAL | 1 refills | Status: DC

## 2023-05-19 MED ORDER — BUSPIRONE HCL 10 MG PO TABS: 10.0000 mg | ORAL_TABLET | Freq: Two times a day (BID) | ORAL | 1 refills | Status: DC

## 2023-05-19 MED ORDER — TRAZODONE HCL 50 MG PO TABS: 50.0000 mg | ORAL_TABLET | Freq: Every evening | ORAL | 1 refills | Status: DC | PRN

## 2023-05-19 MED ORDER — VENLAFAXINE HCL ER 150 MG PO CP24
150.0000 mg | ORAL_CAPSULE | Freq: Every day | ORAL | 1 refills | Status: DC
Start: 2023-05-19 — End: 2023-07-18

## 2023-05-19 MED ORDER — PROPRANOLOL HCL 10 MG PO TABS: 10.0000 mg | ORAL_TABLET | Freq: Every day | ORAL | 1 refills | Status: DC

## 2023-05-19 MED ORDER — ARIPIPRAZOLE 15 MG PO TABS: 15.0000 mg | ORAL_TABLET | Freq: Every day | ORAL | 1 refills | Status: DC
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
## 2023-05-25 ENCOUNTER — Ambulatory Visit (INDEPENDENT_AMBULATORY_CARE_PROVIDER_SITE_OTHER): Payer: 59 | Admitting: Family Medicine

## 2023-05-25 ENCOUNTER — Encounter: Payer: Self-pay | Admitting: Family Medicine

## 2023-05-25 VITALS — BP 106/71 | HR 100 | Ht 65.0 in | Wt 229.8 lb

## 2023-05-25 DIAGNOSIS — R400 Somnolence: Secondary | ICD-10-CM | POA: Diagnosis not present

## 2023-05-25 DIAGNOSIS — Z79899 Other long term (current) drug therapy: Secondary | ICD-10-CM | POA: Diagnosis not present

## 2023-05-25 DIAGNOSIS — F640 Transsexualism: Secondary | ICD-10-CM | POA: Diagnosis not present

## 2023-05-26 DIAGNOSIS — R400 Somnolence: Secondary | ICD-10-CM | POA: Insufficient documentation

## 2023-05-26 DIAGNOSIS — Z79899 Other long term (current) drug therapy: Secondary | ICD-10-CM | POA: Insufficient documentation

## 2023-05-26 NOTE — Assessment & Plan Note (Signed)
Seems to be doing well right now with current transition.  Will continue current medication.  Follow-up 2 to 3 months.

## 2023-05-26 NOTE — Assessment & Plan Note (Signed)
Will set up for sleep evaluation.  He is on trazodone by psychiatry for initiation of sleep and that seems to be working fairly well but having difficulty with maintaining sleep and having difficulty with somnolence during the daytime.

## 2023-05-26 NOTE — Progress Notes (Signed)
    CHIEF COMPLAINT / HPI:  Not sleeping well. Falls asleep Ok buut multiple awakenings throughout the night. Feels fatigued during day whether he has had 6 hours or 10 hours and has difficulty focusing, especially in AM Following with psychiatry and taking meds as prescribed.  On treatment for depression and ADHD.  Was having so much difficulty that he had to drop one of the classes that was early in the morning.  This has been an ongoing problem for quite a while.  Currently no problems with gender affirming hormone therapy.  Transition so far.   PERTINENT  PMH / PSH: I have reviewed the patient's medications, allergies, past medical and surgical history, smoking status and updated in the EMR as appropriate.   OBJECTIVE:  BP 106/71   Pulse 100   Ht 5\' 5"  (1.651 m)   Wt 229 lb 12.8 oz (104.2 kg)   SpO2 98%   BMI 38.24 kg/m  GENERAL: Well-developed, no acute distress PSYCH: AxOx4. Good eye contact.. No psychomotor retardation or agitation. Appropriate speech fluency and content. Asks and answers questions appropriately. Mood is congruent.   ASSESSMENT / PLAN:   Daytime somnolence Will set up for sleep evaluation.  He is on trazodone by psychiatry for initiation of sleep and that seems to be working fairly well but having difficulty with maintaining sleep and having difficulty with somnolence during the daytime.  Transgender man on hormone therapy Seems to be doing well right now with current transition.  Will continue current medication.  Follow-up 2 to 3 months.   Denny Levy MD

## 2023-06-08 ENCOUNTER — Encounter: Payer: Self-pay | Admitting: Allergy

## 2023-06-08 ENCOUNTER — Ambulatory Visit (INDEPENDENT_AMBULATORY_CARE_PROVIDER_SITE_OTHER): Payer: 59 | Admitting: Allergy

## 2023-06-08 VITALS — BP 100/78 | HR 112 | Temp 98.0°F | Resp 16 | Ht 65.0 in | Wt 223.9 lb

## 2023-06-08 DIAGNOSIS — T7800XD Anaphylactic reaction due to unspecified food, subsequent encounter: Secondary | ICD-10-CM | POA: Diagnosis not present

## 2023-06-08 DIAGNOSIS — H1013 Acute atopic conjunctivitis, bilateral: Secondary | ICD-10-CM | POA: Diagnosis not present

## 2023-06-08 DIAGNOSIS — J3089 Other allergic rhinitis: Secondary | ICD-10-CM | POA: Diagnosis not present

## 2023-06-08 DIAGNOSIS — J4530 Mild persistent asthma, uncomplicated: Secondary | ICD-10-CM | POA: Diagnosis not present

## 2023-06-08 DIAGNOSIS — K200 Eosinophilic esophagitis: Secondary | ICD-10-CM

## 2023-06-08 MED ORDER — EPINEPHRINE 0.3 MG/0.3ML IJ SOAJ: 0.3000 mg | INTRAMUSCULAR | 1 refills | Status: AC | PRN

## 2023-06-08 MED ORDER — OMEPRAZOLE 40 MG PO CPDR: 40.0000 mg | DELAYED_RELEASE_CAPSULE | Freq: Every day | ORAL | 11 refills | Status: DC

## 2023-06-08 MED ORDER — ALBUTEROL SULFATE HFA 108 (90 BASE) MCG/ACT IN AERS: 1.0000 | INHALATION_SPRAY | RESPIRATORY_TRACT | 1 refills | Status: DC | PRN

## 2023-06-08 NOTE — Patient Instructions (Addendum)
Asthma:  - doing well without symptoms - have access to albuterol inhaler 2 puffs every 4-6 hours as needed for cough/wheeze/shortness of breath/chest tightness.  May use 15-20 minutes prior to activity.   Monitor frequency of use.   - lung function study remains normal!!  Asthma control goals:  Full participation in all desired activities (may need albuterol before activity) Albuterol use two time or less a week on average (not counting use with activity) Cough interfering with sleep two time or less a month Oral steroids no more than once a year No hospitalizations  Allergies:  - Claritin 10mg  daily as needed for nasal symptoms/hives/itch  If Claritin becomes less effective can use either Zyrtec, Allegra or Xyzal  - use Nasonex 1-2 sprays daily for congestion or drainge as needed.  Use for 1-2 weeks at a time before stopping once symptoms improve  - use pataday 1 drop daily as needed for watery/itchy/red eyes  Food allergy: - continue avoidance of peanut, tree nut and egg - have access to Epipen 0.3mg  at all times  - follow emergency action plan in case of reaction - will plan to update food allergy testing at next visit - exposure to egg in a lesser cooked form with throat itching indicates continued persistence of egg allergy  EOE: - continues to be PPI (omeprazole)-responsive EOE - we have previously discussed therapies for EOE which can include PPI, dietary modifications, swallowed steroids and Dupixent injections  - tomato IgE testing from 8/22 was positive thus if tomato products drive symptoms then would avoid in the diet.  If tomato products do not cause any reflux or swallowing difficulties then ok to keep in diet - continue omeprazole - follow up with GI doctor ( Dr Gwendalyn Ege) as scheduled for monitoring. - let me know if you start to have more swallowing difficulties  Follow-up 1 year or sooner if needed

## 2023-06-08 NOTE — Progress Notes (Signed)
Follow-up Note  RE: Antonio [name] MRN: [id number] DOB: 2006-03-03 Date of Office Visit: 06/08/2023   History of present illness: Antonio [name] is a 17 y.o. adult presenting today for follow-up of asthma, food allergy, allergic rhinitis with conjunctivitis and EOE.  He was last seen in the office on 04/15/2022 by myself.  He denies any major health changes in the past year.  Discussed the use of AI scribe software for clinical note transcription with the patient, who gave verbal consent to proceed.  Over the past year, he reports no need for his rescue inhaler. He denies any recent illnesses such as colds, COVID-19, flu, or sinus infections.  No ED/UC visits or systemic steroid needs for asthma symptoms in past year.  His lung function tests remain within normal limits, with 103% and 123% for forced vital capacity and forced expiratory volume respectively (see below). However, he reports a slight worsening of allergy symptoms this year, with increased sneezing and itchy eyes. He used eye drops twice as needed and has been taking Claritin for symptom relief which he reports has been effective.  The patient also reports an accidental ingestion of egg in a lemon tart while abroad in [place] in March, which resulted in mild throat itching but did not necessitate the use of an EpiPen. He continues to avoid peanuts, tree nuts, and stove-top eggs in his diet.  he does tolerate baked egg products.  he does believe the tart was more of a chilled desert and less likely to have been baked in oven.   Regarding his EOE, he reports no swallowing issues and continues to take Omeprazole. He had an endoscopy and colonoscopy either this year or the end of last year,  that he reports was ok. He is currently under the care of a gastroenterologist, Dr Gwendalyn Ege at [facility], for his EOE.  The patient is currently taking a gap year after graduation.     Review of systems: 10 pt ROS negative unless noted above in  HPI  Past medical/social/surgical/family history have been reviewed and are unchanged unless specifically indicated below.  No changes  Medication List: Current Outpatient Medications  Medication Sig Dispense Refill   albuterol (VENTOLIN HFA) 108 (90 Base) MCG/ACT inhaler Inhale 1-2 puffs into the lungs every 4 (four) hours as needed for wheezing or shortness of breath.     ARIPiprazole (ABILIFY) 15 MG tablet Take 1 tablet (15 mg total) by mouth at bedtime. 30 tablet 1   busPIRone (BUSPAR) 10 MG tablet Take 1 tablet (10 mg total) by mouth 2 (two) times daily. 60 tablet 1   EPINEPHrine (EPIPEN 2-PAK) 0.3 mg/0.3 mL IJ SOAJ injection Inject 0.3 mg into the muscle as needed for anaphylaxis (AS DIRECTED).     naltrexone (DEPADE) 50 MG tablet Take 1 tablet (50 mg total) by mouth daily. 30 tablet 1   omeprazole (PRILOSEC) 40 MG capsule Take 40 mg by mouth daily before breakfast.     PATADAY 0.1 % ophthalmic solution Place 1 drop into both eyes 3 (three) times daily as needed for allergies.     propranolol (INDERAL) 10 MG tablet Take 1 tablet (10 mg total) by mouth at bedtime. 30 tablet 1   Testosterone Cypionate 200 MG/ML SOLN Inject 80 mg into the skin every Sunday. (Patient taking differently: Inject 80 mg into the skin every Wednesday.) 4 mL 1   traZODone (DESYREL) 50 MG tablet Take 1-2 tablets (50-100 mg total) by mouth at bedtime as needed  for sleep. for sleep 60 tablet 1   venlafaxine XR (EFFEXOR-XR) 150 MG 24 hr capsule Take 1 capsule (150 mg total) by mouth daily with breakfast. 30 capsule 1   No current facility-administered medications for this visit.     Known medication allergies: Allergies  Allergen Reactions   Egg-Derived Products Anaphylaxis   Justicia Adhatoda Anaphylaxis and Other (See Comments)    All "TREE NUTS"   Peanut-Containing Drug Products Anaphylaxis     Physical examination: Blood pressure 100/78, pulse (!) 112, temperature 98 F (36.7 C), temperature source  Temporal, resp. rate 16, height 5\' 5"  (1.651 m), weight 223 lb 14.4 oz (101.6 kg), SpO2 96%.  General: Alert, interactive, in no acute distress. HEENT: PERRLA, TMs pearly gray, turbinates non-edematous without discharge, post-pharynx non erythematous. Neck: Supple without lymphadenopathy. Lungs: Clear to auscultation without wheezing, rhonchi or rales. {no increased work of breathing. CV: Normal S1, S2 without murmurs. Abdomen: Nondistended, nontender. Skin: Warm and dry, without lesions or rashes. Extremities:  No clubbing, cyanosis or edema. Neuro:   Grossly intact.  Diagnositics/Labs: Spirometry: FEV1: 3.07 L 103%, FVC: 4.11 L 123%, ratio consistent with nonobstructive pattern  Assessment and plan:   Asthma:  - doing well without symptoms - have access to albuterol inhaler 2 puffs every 4-6 hours as needed for cough/wheeze/shortness of breath/chest tightness.  May use 15-20 minutes prior to activity.   Monitor frequency of use.   - lung function study remains normal!!  Asthma control goals:  Full participation in all desired activities (may need albuterol before activity) Albuterol use two time or less a week on average (not counting use with activity) Cough interfering with sleep two time or less a month Oral steroids no more than once a year No hospitalizations  Allergic rhinitis with conjunctivitis:  - Claritin 10mg  daily as needed for nasal symptoms/hives/itch  If Claritin becomes less effective can use either Zyrtec, Allegra or Xyzal  - use Nasonex 1-2 sprays daily for congestion or drainge as needed.  Use for 1-2 weeks at a time before stopping once symptoms improve  - use pataday 1 drop daily as needed for watery/itchy/red eyes  Food allergy: - continue avoidance of peanut, tree nut and egg - have access to Epipen 0.3mg  at all times  - follow emergency action plan in case of reaction - will plan to update food allergy testing at next visit - exposure to egg in a  lesser cooked form with throat itching indicates continued persistence of egg allergy  EOE: - continues to be PPI (omeprazole)-responsive EOE - we have previously discussed therapies for EOE which can include PPI, dietary modifications, swallowed steroids and Dupixent injections  - tomato IgE testing from 8/22 was positive thus if tomato products drive symptoms then would avoid in the diet.  If tomato products do not cause any reflux or swallowing difficulties then ok to keep in diet - continue omeprazole - follow up with GI doctor ( Dr Gwendalyn Ege) as scheduled for monitoring. - let me know if you start to have more swallowing difficulties  Follow-up 1 year or sooner if needed  I appreciate the opportunity to take part in Brayam's care. Please do not hesitate to contact me with questions.  Sincerely,   Margo Aye, MD Allergy/Immunology [facility]

## 2023-06-09 ENCOUNTER — Ambulatory Visit: Payer: 59 | Admitting: Allergy

## 2023-06-09 ENCOUNTER — Encounter: Payer: Self-pay | Admitting: Family Medicine

## 2023-06-12 ENCOUNTER — Other Ambulatory Visit: Payer: Self-pay | Admitting: Child and Adolescent Psychiatry

## 2023-06-22 ENCOUNTER — Telehealth (INDEPENDENT_AMBULATORY_CARE_PROVIDER_SITE_OTHER): Payer: 59 | Admitting: Child and Adolescent Psychiatry

## 2023-06-22 ENCOUNTER — Other Ambulatory Visit (INDEPENDENT_AMBULATORY_CARE_PROVIDER_SITE_OTHER): Payer: Self-pay | Admitting: Pediatrics

## 2023-06-22 DIAGNOSIS — F840 Autistic disorder: Secondary | ICD-10-CM | POA: Diagnosis not present

## 2023-06-22 DIAGNOSIS — F649 Gender identity disorder, unspecified: Secondary | ICD-10-CM | POA: Diagnosis not present

## 2023-06-22 DIAGNOSIS — F4010 Social phobia, unspecified: Secondary | ICD-10-CM

## 2023-06-22 DIAGNOSIS — F411 Generalized anxiety disorder: Secondary | ICD-10-CM

## 2023-06-22 DIAGNOSIS — F39 Unspecified mood [affective] disorder: Secondary | ICD-10-CM | POA: Diagnosis not present
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
## 2023-06-24 ENCOUNTER — Other Ambulatory Visit: Payer: Self-pay | Admitting: Child and Adolescent Psychiatry

## 2023-07-03 ENCOUNTER — Other Ambulatory Visit: Payer: Self-pay | Admitting: Child and Adolescent Psychiatry

## 2023-07-18 ENCOUNTER — Telehealth (INDEPENDENT_AMBULATORY_CARE_PROVIDER_SITE_OTHER): Payer: 59 | Admitting: Child and Adolescent Psychiatry

## 2023-07-18 DIAGNOSIS — F39 Unspecified mood [affective] disorder: Secondary | ICD-10-CM | POA: Diagnosis not present

## 2023-07-18 DIAGNOSIS — F649 Gender identity disorder, unspecified: Secondary | ICD-10-CM

## 2023-07-18 DIAGNOSIS — F4010 Social phobia, unspecified: Secondary | ICD-10-CM

## 2023-07-18 MED ORDER — VENLAFAXINE HCL ER 150 MG PO CP24
150.0000 mg | ORAL_CAPSULE | Freq: Every day | ORAL | 1 refills | Status: DC
Start: 2023-07-18 — End: 2023-08-24

## 2023-07-18 MED ORDER — NALTREXONE HCL 50 MG PO TABS: 50.0000 mg | ORAL_TABLET | Freq: Every day | ORAL | 1 refills | Status: DC
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
## 2023-07-21 ENCOUNTER — Other Ambulatory Visit (INDEPENDENT_AMBULATORY_CARE_PROVIDER_SITE_OTHER): Payer: Self-pay | Admitting: Pediatric Endocrinology

## 2023-08-17 NOTE — Progress Notes (Deleted)
10/29/22- Reason for Consult:*** Referring Physician: ***  Antonio [name] is an 17 y.o. adult.  Discussed the use of AI scribe software for clinical note transcription with the patient, who gave verbal consent to proceed.  History of Present Illness            Past Medical History:  Diagnosis Date   Allergic rhinoconjunctivitis 06/06/2015   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   Asthma    Bupropion overdose 01/16/2020   Complication of anesthesia    gets disoriented and shakes after surgery   Deliberate self-cutting 10/17/2018   Depression    Eczema    Fracture of 5th metatarsal    Gender dysphoria    Intentional overdose of drug in tablet form (HCC) 04/03/2020   MDD (major depressive disorder), recurrent severe, without psychosis (HCC) 11/02/2021   Moderate episode of recurrent major depressive disorder (HCC) 04/24/2020   Multiple allergies    Severe episode of recurrent major depressive disorder, without psychotic features (HCC) 01/02/2020   Suicidal ideations 08/31/2022   Suicide attempt by cutting of wrist (HCC) 07/28/2022    Past Surgical History:  Procedure Laterality Date   SUPPRELIN IMPLANT Left 01/22/2019   Procedure: SUPPRELIN IMPLANT;  Surgeon: Kandice Hams, MD;  Location: [facility];  Service: Pediatrics;  Laterality: Left;   SUPPRELIN REMOVAL N/A 03/03/2020   Procedure: SUPPRELIN REMOVAL;  Surgeon: Kandice Hams, MD;  Location: [facility];  Service: Pediatrics;  Laterality: N/A;   TONSILLECTOMY     TYMPANOSTOMY TUBE PLACEMENT      Family History  Problem Relation Age of Onset   Depression Mother    Anxiety disorder Mother    Hypertension Maternal Grandmother    Allergic rhinitis Neg Hx    Angioedema Neg Hx    Atopy Neg Hx    Eczema Neg Hx    Immunodeficiency Neg Hx    Urticaria Neg Hx     Social History:  reports that he has never smoked. He has never used smokeless tobacco. He reports that he does not  currently use drugs after having used the following drugs: Marijuana. He reports that he does not drink alcohol.  Allergies:  Allergies  Allergen Reactions   Egg-Derived Products Anaphylaxis   Justicia Adhatoda Anaphylaxis and Other (See Comments)    All "TREE NUTS"   Peanut-Containing Drug Products Anaphylaxis    Medications: {medication reviewed/display:[id number]}  Results           ROS:  ROS-see HPI   Negative unless "+" Constitutional:    weight loss, night sweats, fevers, chills, fatigue, lassitude. HEENT:    headaches, difficulty swallowing, tooth/dental problems, sore throat,       sneezing, itching, ear ache, nasal congestion, post nasal drip, snoring CV:    chest pain, orthopnea, PND, swelling in lower extremities, anasarca,                                  dizziness, palpitations Resp:   shortness of breath with exertion or at rest.                productive cough,   non-productive cough, coughing up of blood.              change in color of mucus.  wheezing.   Skin:    rash or lesions. GI:  No-   heartburn, indigestion, abdominal pain, nausea, vomiting,  diarrhea,                 change in bowel habits, loss of appetite GU: dysuria, change in color of urine, no urgency or frequency.   flank pain. MS:   joint pain, stiffness, decreased range of motion, back pain. Neuro-     nothing unusual Psych:  change in mood or affect.  depression or anxiety.   memory loss.   PHYS EXAM: OBJ- Physical Exam General- Alert, Oriented, Affect-appropriate, Distress- none acute Skin- rash-none, lesions- none, excoriation- none Lymphadenopathy- none Head- atraumatic            Eyes- Gross vision intact, PERRLA, conjunctivae and secretions clear            Ears- Hearing, canals-normal            Nose- Clear, no-Septal dev, mucus, polyps, erosion, perforation             Throat- Mallampati II , mucosa clear , drainage- none, tonsils- atrophic Neck- flexible , trachea midline, no stridor  , thyroid nl, carotid no bruit Chest - symmetrical excursion , unlabored           Heart/CV- RRR , no murmur , no gallop  , no rub, nl s1 s2                           - JVD- none , edema- none, stasis changes- none, varices- none           Lung- clear to P&A, wheeze- none, cough- none , dullness-none, rub- none           Chest wall-  Abd-  Br/ Gen/ Rectal- Not done, not indicated Extrem- cyanosis- none, clubbing, none, atrophy- none, strength- nl Neuro- grossly intact to observation     Assessment and Plan               Antonio [name] 08/17/2023, 2:58 PM

## 2023-08-19 ENCOUNTER — Emergency Department (HOSPITAL_COMMUNITY)
Admission: EM | Admit: 2023-08-19 | Discharge: 2023-08-19 | Disposition: A | Discharge status: Other Acute Inpatient Hospital | Payer: 59 | Attending: Emergency Medicine | Admitting: Emergency Medicine

## 2023-08-19 ENCOUNTER — Other Ambulatory Visit: Payer: Self-pay

## 2023-08-19 ENCOUNTER — Inpatient Hospital Stay
Admission: AD | Admit: 2023-08-19 | Discharge: 2023-08-24 | DRG: 885 | Disposition: A | Discharge status: Home / Self Care | Payer: 59 | Source: Intra-hospital | Attending: Psychiatry | Admitting: Psychiatry

## 2023-08-19 ENCOUNTER — Encounter (HOSPITAL_COMMUNITY): Payer: Self-pay

## 2023-08-19 DIAGNOSIS — Z888 Allergy status to other drugs, medicaments and biological substances status: Secondary | ICD-10-CM

## 2023-08-19 DIAGNOSIS — Z91010 Allergy to peanuts: Secondary | ICD-10-CM | POA: Insufficient documentation

## 2023-08-19 DIAGNOSIS — R45851 Suicidal ideations: Secondary | ICD-10-CM | POA: Diagnosis present

## 2023-08-19 DIAGNOSIS — Z9151 Personal history of suicidal behavior: Secondary | ICD-10-CM

## 2023-08-19 DIAGNOSIS — Z8249 Family history of ischemic heart disease and other diseases of the circulatory system: Secondary | ICD-10-CM | POA: Diagnosis not present

## 2023-08-19 DIAGNOSIS — Z7289 Other problems related to lifestyle: Secondary | ICD-10-CM

## 2023-08-19 DIAGNOSIS — Z818 Family history of other mental and behavioral disorders: Secondary | ICD-10-CM | POA: Diagnosis not present

## 2023-08-19 DIAGNOSIS — Z9152 Personal history of nonsuicidal self-harm: Secondary | ICD-10-CM | POA: Diagnosis not present

## 2023-08-19 DIAGNOSIS — J45909 Unspecified asthma, uncomplicated: Secondary | ICD-10-CM | POA: Insufficient documentation

## 2023-08-19 DIAGNOSIS — F603 Borderline personality disorder: Secondary | ICD-10-CM | POA: Diagnosis not present

## 2023-08-19 DIAGNOSIS — F640 Transsexualism: Secondary | ICD-10-CM | POA: Diagnosis present

## 2023-08-19 DIAGNOSIS — T1491XA Suicide attempt, initial encounter: Secondary | ICD-10-CM | POA: Diagnosis not present

## 2023-08-19 DIAGNOSIS — X789XXA Intentional self-harm by unspecified sharp object, initial encounter: Secondary | ICD-10-CM | POA: Diagnosis present

## 2023-08-19 DIAGNOSIS — K219 Gastro-esophageal reflux disease without esophagitis: Secondary | ICD-10-CM | POA: Diagnosis present

## 2023-08-19 DIAGNOSIS — Z5982 Transportation insecurity: Secondary | ICD-10-CM

## 2023-08-19 DIAGNOSIS — F332 Major depressive disorder, recurrent severe without psychotic features: Secondary | ICD-10-CM | POA: Diagnosis present

## 2023-08-19 DIAGNOSIS — Z79899 Other long term (current) drug therapy: Secondary | ICD-10-CM | POA: Diagnosis not present

## 2023-08-19 DIAGNOSIS — Z79890 Hormone replacement therapy (postmenopausal): Secondary | ICD-10-CM | POA: Diagnosis not present

## 2023-08-19 DIAGNOSIS — S61512A Laceration without foreign body of left wrist, initial encounter: Secondary | ICD-10-CM | POA: Diagnosis not present

## 2023-08-19 DIAGNOSIS — F411 Generalized anxiety disorder: Secondary | ICD-10-CM | POA: Diagnosis present

## 2023-08-19 DIAGNOSIS — Z87891 Personal history of nicotine dependence: Secondary | ICD-10-CM | POA: Diagnosis not present

## 2023-08-19 DIAGNOSIS — Z91012 Allergy to eggs: Secondary | ICD-10-CM | POA: Diagnosis not present

## 2023-08-19 DIAGNOSIS — F649 Gender identity disorder, unspecified: Secondary | ICD-10-CM | POA: Diagnosis not present

## 2023-08-19 DIAGNOSIS — R000 Tachycardia, unspecified: Secondary | ICD-10-CM | POA: Insufficient documentation

## 2023-08-19 DIAGNOSIS — X788XXA Intentional self-harm by other sharp object, initial encounter: Secondary | ICD-10-CM | POA: Diagnosis not present

## 2023-08-19 DIAGNOSIS — F339 Major depressive disorder, recurrent, unspecified: Secondary | ICD-10-CM | POA: Diagnosis present

## 2023-08-19 LAB — CBC
HCT: 43.1 % (ref 39.0–52.0)
Hemoglobin: 13.6 g/dL (ref 13.0–17.0)
MCH: 23.9 pg — ABNORMAL LOW (ref 26.0–34.0)
MCHC: 31.6 g/dL (ref 30.0–36.0)
MCV: 75.7 fL — ABNORMAL LOW (ref 80.0–100.0)
Platelets: 300 10*3/uL (ref 150–400)
RBC: 5.69 MIL/uL (ref 4.22–5.81)
RDW: 13.8 % (ref 11.5–15.5)
WBC: 5.2 10*3/uL (ref 4.0–10.5)
nRBC: 0 % (ref 0.0–0.2)

## 2023-08-19 LAB — RAPID URINE DRUG SCREEN, HOSP PERFORMED
Amphetamines: NOT DETECTED
Barbiturates: NOT DETECTED
Benzodiazepines: NOT DETECTED
Cocaine: NOT DETECTED
Opiates: NOT DETECTED
Tetrahydrocannabinol: NOT DETECTED

## 2023-08-19 LAB — COMPREHENSIVE METABOLIC PANEL
ALT: 26 U/L (ref 0–44)
AST: 22 U/L (ref 15–41)
Albumin: 4.1 g/dL (ref 3.5–5.0)
Alkaline Phosphatase: 77 U/L (ref 38–126)
Anion gap: 11 (ref 5–15)
BUN: 12 mg/dL (ref 6–20)
CO2: 23 mmol/L (ref 22–32)
Calcium: 9.2 mg/dL (ref 8.9–10.3)
Chloride: 99 mmol/L (ref 98–111)
Creatinine, Ser: 1.22 mg/dL (ref 0.61–1.24)
GFR, Estimated: >60 mL/min (ref >60)
Glucose, Bld: 77 mg/dL (ref 70–99)
Potassium: 3.8 mmol/L (ref 3.5–5.1)
Sodium: 133 mmol/L — ABNORMAL LOW (ref 135–145)
Total Bilirubin: 1 mg/dL (ref <1.2)
Total Protein: 7.7 g/dL (ref 6.5–8.1)

## 2023-08-19 LAB — HCG, SERUM, QUALITATIVE: Preg, Serum: NEGATIVE

## 2023-08-19 LAB — SALICYLATE LEVEL: Salicylate Lvl: <7 mg/dL — ABNORMAL LOW (ref 7.0–30.0)

## 2023-08-19 LAB — ACETAMINOPHEN LEVEL: Acetaminophen (Tylenol), Serum: <10 ug/mL — ABNORMAL LOW (ref 10–30)

## 2023-08-19 LAB — ETHANOL: Alcohol, Ethyl (B): <10 mg/dL (ref <10)

## 2023-08-19 MED ORDER — TESTOSTERONE CYPIONATE 200 MG/ML IJ SOLN: 80.0000 mg | INTRAMUSCULAR | Status: DC

## 2023-08-19 MED ORDER — TRAZODONE HCL 50 MG PO TABS: 50.0000 mg | ORAL_TABLET | Freq: Every evening | ORAL | Status: DC | PRN

## 2023-08-19 MED ORDER — HALOPERIDOL 5 MG PO TABS: 5.0000 mg | ORAL_TABLET | Freq: Three times a day (TID) | ORAL | Status: DC | PRN

## 2023-08-19 MED ORDER — BUSPIRONE HCL 10 MG PO TABS
10.0000 mg | ORAL_TABLET | Freq: Two times a day (BID) | ORAL | Status: DC
Start: 2023-08-19 — End: 2023-08-19
  Administered 2023-08-19: 10 mg via ORAL
  Filled 2023-08-19: qty 1

## 2023-08-19 MED ORDER — ALBUTEROL SULFATE HFA 108 (90 BASE) MCG/ACT IN AERS: 1.0000 | INHALATION_SPRAY | RESPIRATORY_TRACT | Status: DC | PRN

## 2023-08-19 MED ORDER — ALUM & MAG HYDROXIDE-SIMETH 200-200-20 MG/5ML PO SUSP: 30.0000 mL | ORAL | Status: DC | PRN

## 2023-08-19 MED ORDER — DIPHENHYDRAMINE HCL 25 MG PO CAPS: 50.0000 mg | ORAL_CAPSULE | Freq: Three times a day (TID) | ORAL | Status: DC | PRN

## 2023-08-19 MED ORDER — BUSPIRONE HCL 5 MG PO TABS
10.0000 mg | ORAL_TABLET | Freq: Two times a day (BID) | ORAL | Status: DC
  Administered 2023-08-19 – 2023-08-24 (×9): 10 mg via ORAL
  Filled 2023-08-19 (×10): qty 2

## 2023-08-19 MED ORDER — ARIPIPRAZOLE 5 MG PO TABS
15.0000 mg | ORAL_TABLET | Freq: Every day | ORAL | Status: DC
  Administered 2023-08-19 – 2023-08-23 (×5): 15 mg via ORAL
  Filled 2023-08-19 (×5): qty 1

## 2023-08-19 MED ORDER — MAGNESIUM HYDROXIDE 400 MG/5ML PO SUSP: 30.0000 mL | Freq: Every day | ORAL | Status: DC | PRN

## 2023-08-19 MED ORDER — VENLAFAXINE HCL ER 150 MG PO CP24: 150.0000 mg | ORAL_CAPSULE | Freq: Every day | ORAL | Status: DC

## 2023-08-19 MED ORDER — ALBUTEROL SULFATE (2.5 MG/3ML) 0.083% IN NEBU: 2.5000 mg | INHALATION_SOLUTION | RESPIRATORY_TRACT | Status: DC | PRN

## 2023-08-19 MED ORDER — NALTREXONE HCL 50 MG PO TABS
50.0000 mg | ORAL_TABLET | Freq: Every day | ORAL | Status: DC
  Administered 2023-08-20 – 2023-08-24 (×5): 50 mg via ORAL
  Filled 2023-08-19 (×5): qty 1

## 2023-08-19 MED ORDER — LIDOCAINE-EPINEPHRINE (PF) 2 %-1:200000 IJ SOLN
10.0000 mL | Freq: Once | INTRAMUSCULAR | Status: AC
  Administered 2023-08-19: 10 mL
  Filled 2023-08-19: qty 20

## 2023-08-19 MED ORDER — NALTREXONE HCL 50 MG PO TABS
50.0000 mg | ORAL_TABLET | Freq: Every day | ORAL | Status: DC
  Administered 2023-08-19: 50 mg via ORAL
  Filled 2023-08-19: qty 1

## 2023-08-19 MED ORDER — TRAZODONE HCL 50 MG PO TABS
50.0000 mg | ORAL_TABLET | Freq: Every evening | ORAL | Status: DC | PRN
  Administered 2023-08-22 – 2023-08-23 (×2): 50 mg via ORAL
  Filled 2023-08-19: qty 2
  Filled 2023-08-19 (×2): qty 1

## 2023-08-19 MED ORDER — VENLAFAXINE HCL ER 75 MG PO CP24
150.0000 mg | ORAL_CAPSULE | Freq: Every day | ORAL | Status: DC
  Administered 2023-08-20 – 2023-08-24 (×5): 150 mg via ORAL
  Filled 2023-08-19 (×5): qty 2

## 2023-08-19 MED ORDER — PROPRANOLOL HCL 20 MG PO TABS
10.0000 mg | ORAL_TABLET | Freq: Every day | ORAL | Status: DC
  Administered 2023-08-19 – 2023-08-23 (×5): 10 mg via ORAL
  Filled 2023-08-19 (×5): qty 1

## 2023-08-19 MED ORDER — BACITRACIN ZINC 500 UNIT/GM EX OINT
TOPICAL_OINTMENT | Freq: Two times a day (BID) | CUTANEOUS | Status: DC
  Administered 2023-08-19: 1 via TOPICAL

## 2023-08-19 MED ORDER — PROPRANOLOL HCL 10 MG PO TABS
10.0000 mg | ORAL_TABLET | Freq: Every day | ORAL | Status: DC
  Filled 2023-08-19: qty 1

## 2023-08-19 MED ORDER — ARIPIPRAZOLE 5 MG PO TABS
15.0000 mg | ORAL_TABLET | Freq: Every day | ORAL | Status: DC
Start: 2023-08-19 — End: 2023-08-19

## 2023-08-19 MED ORDER — ACETAMINOPHEN 325 MG PO TABS
650.0000 mg | ORAL_TABLET | Freq: Four times a day (QID) | ORAL | Status: DC | PRN
  Administered 2023-08-20: 650 mg via ORAL
  Filled 2023-08-19: qty 2

## 2023-08-19 NOTE — ED Notes (Signed)
Pt escorted outside by this RN to safe transport vehicle, ambulatory with no gait abnormalities, Aox4, VSS, calm and cooperative. Belongings and facility paperwork given to safe transport.

## 2023-08-19 NOTE — Discharge Instructions (Signed)
Sutures need to be removed in 7-10 days. 

## 2023-08-19 NOTE — ED Notes (Signed)
Pt denying SI/HI/Hallucinations at this time

## 2023-08-19 NOTE — ED Triage Notes (Signed)
Pt arrived from home via POV s/p suicide attempt, pt noted to cut left wrist. Pt states that he does not want to kill himself anymore

## 2023-08-19 NOTE — Consult Note (Signed)
BH ED ASSESSMENT   Reason for Consult:  Psychiatry evaluation Referring Physician:  ER Physician Patient Identification: Antonio [name] MRN:  [id number] ED Chief Complaint: MDD (major depressive disorder), recurrent severe, without psychosis (HCC)  Diagnosis:  Principal Problem:   MDD (major depressive disorder), recurrent severe, without psychosis (HCC) Active Problems:   Gender dysphoria   Suicide attempt by cutting of wrist (HCC)   Borderline personality disorder [facility])   ED Assessment Time Calculation: 1hour   Subjective:   Antonio [name] is a 17 y.o. adult presented after a suicide attempt by laceration to left arm that required medical interventions. The patient presented calm, cooperative, and engaged in meaningful conversation during the assessment. He reported no known triggers or stressors that led to today's suicide attempt. During the evaluation, an attempt was made to differentiate between a suicide attempt and self-harm. The patient has a large laceration on his left forearm, which was sutured (14) and bandaged. At the time of the assessment, the patient denied feeling suicidal and was unable or unwilling to articulate his intentions regarding the deep cut. Despite this, the patient expressed a voluntary desire to be admitted to the inpatient psychiatric unit for safety and stabilization. He reported good compliance with his medications and lives with supportive family members, including his mother and stepfather. The patient is in the process of transitioning from male to male and prefers to be addressed using he/him pronouns. In light of his current state, inpatient psychiatric admission is being pursued for safety and stabilization.   HPI:  Pt is a 17 yo trans male with pmhx significant for gender dysphoria, ASD, asthma and depression. He was feeling bad early this am and cut himself with a razor. He felt suicidal. Pt said this has been a rough month. He has been compliant with  his medications. He denies any other sx. His mom and step dad drove him here this am. No SI currently. He said it comes and goes.   Past Psychiatric History: Generalized anxiety disorder, Social anxiety, ASD, Gender Dysphoria and recurrent Major depressive disorder.  Multiple Psychiatry inpatient hospitalizations to various hospitals.  Multiple self harm behavior by cutting.  Risk to Self or Others: Is the patient at risk to self? Yes Has the patient been a risk to self in the past 6 months? No Has the patient been a risk to self within the distant past? Yes Is the patient a risk to others? No Has the patient been a risk to others in the past 6 months? No Has the patient been a risk to others within the distant past? No  [place] Scale:  Flowsheet Row ED from 08/19/2023 in [facility] Department at [facility] Most recent reading at 08/19/2023  4:27 AM Admission (Discharged) [facility] Most recent reading at 02/09/2023 10:56 PM ED from 02/09/2023 in [facility] Emergency Department at [facility] Most recent reading at 02/09/2023  3:14 AM  C-SSRS RISK CATEGORY Error: Question 6 not populated High Risk High Risk       AIMS:  , , ,  ,   ASAM:    Substance Abuse:     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   Asthma    Bupropion overdose 01/16/2020   Complication of anesthesia    gets disoriented and shakes after surgery   Deliberate  self-cutting 10/17/2018   Depression    Eczema    Fracture of 5th metatarsal    Gender dysphoria    Intentional overdose of drug in tablet form (HCC) 04/03/2020   MDD (major depressive disorder), recurrent severe, without psychosis (HCC) 11/02/2021   Moderate episode of recurrent major depressive disorder (HCC) 04/24/2020   Multiple allergies    Severe episode  of recurrent major depressive disorder, without psychotic features (HCC) 01/02/2020   Suicidal ideations 08/31/2022   Suicide attempt by cutting of wrist (HCC) 07/28/2022    Past Surgical History:  Procedure Laterality Date   SUPPRELIN IMPLANT Left 01/22/2019   Procedure: SUPPRELIN IMPLANT;  Surgeon: Kandice Hams, MD;  Location: [facility];  Service: Pediatrics;  Laterality: Left;   SUPPRELIN REMOVAL N/A 03/03/2020   Procedure: SUPPRELIN REMOVAL;  Surgeon: Kandice Hams, MD;  Location: [facility];  Service: Pediatrics;  Laterality: N/A;   TONSILLECTOMY     TYMPANOSTOMY TUBE PLACEMENT     Family History:  Family History  Problem Relation Age of Onset   Depression Mother    Anxiety disorder Mother    Hypertension Maternal Grandmother    Allergic rhinitis Neg Hx    Angioedema Neg Hx    Atopy Neg Hx    Eczema Neg Hx    Immunodeficiency Neg Hx    Urticaria Neg Hx    Family Psychiatric  History: Mother- Depression, anxiety.  Patient denies family hx suicide, Bipolar disorder or Schizophrenia. Social History:  Social History   Substance and Sexual Activity  Alcohol Use No     Social History   Substance and Sexual Activity  Drug Use Not Currently   Types: Marijuana    Social History   Socioeconomic History   Marital status: Single    Spouse name: Not on file   Number of children: Not on file   Years of education: Not on file   Highest education level: 12th grade  Occupational History   Not on file  Tobacco Use   Smoking status: Never   Smokeless tobacco: Never  Vaping Use   Vaping status: Former   Substances: Nicotine, Flavoring   Devices: Nicotine  Substance and Sexual Activity   Alcohol use: No   Drug use: Not Currently    Types: Marijuana   Sexual activity: Not Currently  Other Topics Concern   Not on file  Social History Narrative   Lives with mom and stepdad.      He is in 12th grade at [employer] 23-24 school  year      He enjoys writing and listen to music, writes poetry   Social Determinants of Health   Financial Resource Strain: Low Risk  (05/24/2023)   Overall Financial Resource Strain (CARDIA)    Difficulty of Paying Living Expenses: Not hard at all  Food Insecurity: No Food Insecurity (05/24/2023)   Hunger Vital Sign    Worried About Running Out of Food in the Last Year: Never true    Ran Out of Food in the Last Year: Never true  Transportation Needs: Unmet Transportation Needs (05/24/2023)   PRAPARE - Administrator, Civil Service (Medical): Yes    Lack of Transportation (Non-Medical): Yes  Physical Activity: Unknown (05/24/2023)   Exercise Vital Sign    Days of Exercise per Week: 0 days    Minutes of Exercise per Session: Not on file  Stress: Stress Concern Present (05/24/2023)   [employer] of Occupational  Health - Occupational Stress Questionnaire    Feeling of Stress : To some extent  Social Connections: Unknown (06/24/2023)   Received from [facility]   Social Network    Social Network: Not on file   Additional Social History:    Allergies:   Allergies  Allergen Reactions   Egg-Derived Products Anaphylaxis   Justicia Adhatoda Anaphylaxis and Other (See Comments)    All "TREE NUTS"   Peanut-Containing Drug Products Anaphylaxis    Labs:  Results for orders placed or performed during the hospital encounter of 08/19/23 (from the past 48 hour(s))  Comprehensive metabolic panel     Status: Abnormal   Collection Time: 08/19/23  5:57 AM  Result Value Ref Range   Sodium 133 (L) 135 - 145 mmol/L   Potassium 3.8 3.5 - 5.1 mmol/L   Chloride 99 98 - 111 mmol/L   CO2 23 22 - 32 mmol/L   Glucose, Bld 77 70 - 99 mg/dL    Comment: Glucose reference range applies only to samples taken after fasting for at least 8 hours.   BUN 12 6 - 20 mg/dL   Creatinine, Ser 1.61 0.61 - 1.24 mg/dL   Calcium 9.2 8.9 - 09.6 mg/dL   Total Protein 7.7 6.5 - 8.1 g/dL   Albumin  4.1 3.5 - 5.0 g/dL   AST 22 15 - 41 U/L   ALT 26 0 - 44 U/L   Alkaline Phosphatase 77 38 - 126 U/L   Total Bilirubin 1.0 <1.2 mg/dL   GFR, Estimated >04 >54 mL/min    Comment: (NOTE) Calculated using the CKD-EPI Creatinine Equation (2021)    Anion gap 11 5 - 15    Comment: Performed at [facility], [address], [place], [place] [address]  Ethanol     Status: None   Collection Time: 08/19/23  5:57 AM  Result Value Ref Range   Alcohol, Ethyl (B) <10 <10 mg/dL    Comment: (NOTE) Lowest detectable limit for serum alcohol is 10 mg/dL.  For medical purposes only. Performed at [facility], [address] [place], [place] [address]   Salicylate level     Status: Abnormal   Collection Time: 08/19/23  5:57 AM  Result Value Ref Range   Salicylate Lvl <7.0 (L) 7.0 - 30.0 mg/dL    Comment: Performed at [facility], [address] [place], [place] [address]  Acetaminophen level     Status: Abnormal   Collection Time: 08/19/23  5:57 AM  Result Value Ref Range   Acetaminophen (Tylenol), Serum <10 (L) 10 - 30 ug/mL    Comment: (NOTE) Therapeutic concentrations vary significantly. A range of 10-30 ug/mL  may be an effective concentration for many patients. However, some  are best treated at concentrations outside of this range. Acetaminophen concentrations >150 ug/mL at 4 hours after ingestion  and >50 ug/mL at 12 hours after ingestion are often associated with  toxic reactions.  Performed at [facility], [address], [place], [place] [address]   cbc     Status: Abnormal   Collection Time: 08/19/23  5:57 AM  Result Value Ref Range   WBC 5.2 4.0 - 10.5 K/uL   RBC 5.69 4.22 - 5.81 MIL/uL   Hemoglobin 13.6 13.0 - 17.0 g/dL   HCT 57.8 46.9 - 62.9 %   MCV 75.7 (L) 80.0 - 100.0 fL   MCH 23.9 (L) 26.0 - 34.0 pg   MCHC 31.6 30.0 -  36.0 g/dL   RDW 19.1 47.8 - 29.5 %   Platelets 300 150 - 400 K/uL   nRBC 0.0 0.0 - 0.2 %    Comment: Performed at [facility], [address], [place], [place] [address]  hCG, serum, qualitative     Status: None   Collection Time: 08/19/23  5:57 AM  Result Value Ref Range   Preg, Serum NEGATIVE NEGATIVE    Comment:        THE SENSITIVITY OF THIS METHODOLOGY IS >10 mIU/mL. Performed at [facility], [address], [place], [place] [address]   Rapid urine drug screen (hospital performed)     Status: None   Collection Time: 08/19/23  6:00 AM  Result Value Ref Range   Opiates NONE DETECTED NONE DETECTED   Cocaine NONE DETECTED NONE DETECTED   Benzodiazepines NONE DETECTED NONE DETECTED   Amphetamines NONE DETECTED NONE DETECTED   Tetrahydrocannabinol NONE DETECTED NONE DETECTED   Barbiturates NONE DETECTED NONE DETECTED    Comment: (NOTE) DRUG SCREEN FOR MEDICAL PURPOSES ONLY.  IF CONFIRMATION IS NEEDED FOR ANY PURPOSE, NOTIFY LAB WITHIN 5 DAYS.  LOWEST DETECTABLE LIMITS FOR URINE DRUG SCREEN Drug Class                     Cutoff (ng/mL) Amphetamine and metabolites    1000 Barbiturate and metabolites    200 Benzodiazepine                 200 Opiates and metabolites        300 Cocaine and metabolites        300 THC                            50 Performed at [facility], [address], [place], [place] [address]     Current Facility-Administered Medications  Medication Dose Route Frequency Provider Last Rate Last Admin   bacitracin ointment   Topical BID Jacalyn Lefevre, MD   1 Application at 08/19/23 0802   Current Outpatient Medications  Medication Sig Dispense Refill   ARIPiprazole (ABILIFY) 15 MG tablet TAKE 1 TABLET BY MOUTH EVERYDAY AT BEDTIME 90 tablet 1   busPIRone (BUSPAR) 10 MG tablet TAKE 1 TABLET BY MOUTH TWICE A DAY 180 tablet 1   EPINEPHrine (EPIPEN 2-PAK) 0.3 mg/0.3 mL IJ SOAJ injection Inject 0.3 mg into the muscle as needed for anaphylaxis (AS DIRECTED). 2 each 1   naltrexone (DEPADE) 50 MG tablet Take 1 tablet (50 mg total) by mouth daily. 90 tablet 1    omeprazole (PRILOSEC) 40 MG capsule Take 1 capsule (40 mg total) by mouth daily before breakfast. 30 capsule 11   PATADAY 0.1 % ophthalmic solution Place 1 drop into both eyes 3 (three) times daily as needed for allergies.     propranolol (INDERAL) 10 MG tablet TAKE 1 TABLET BY MOUTH EVERYDAY AT BEDTIME 90 tablet 1   Testosterone Cypionate 200 MG/ML SOLN Inject 80 mg into the skin every Sunday. (Patient taking differently: Inject 80 mg into the skin every Wednesday.) 4 mL 1   traZODone (DESYREL) 50 MG tablet TAKE 1-2 TABLETS (50-100 MG TOTAL) BY MOUTH AT BEDTIME AS NEEDED FOR SLEEP. FOR SLEEP 180 tablet 1   venlafaxine XR (EFFEXOR-XR) 150 MG 24 hr capsule Take 1 capsule (150 mg total) by mouth daily with breakfast. 90 capsule 1   albuterol (VENTOLIN HFA) 108 (  90 Base) MCG/ACT inhaler Inhale 1-2 puffs into the lungs every 4 (four) hours as needed for wheezing or shortness of breath. 18 g 1    Musculoskeletal: Strength & Muscle Tone: within normal limits Gait & Station: normal Patient leans: Front   Psychiatric Specialty Exam: Presentation  General Appearance:  Appropriate for Environment; Casual  Eye Contact: Good  Speech: Clear and Coherent; Normal Rate  Speech Volume: Normal  Handedness: Right   Mood and Affect  Mood: Dysphoric  Affect: Appropriate; Flat   Thought Process  Thought Processes: Coherent; Linear  Descriptions of Associations:Intact  Orientation:Full (Time, Place and Person)  Thought Content:WDL  History of Schizophrenia/Schizoaffective disorder:No data recorded Duration of Psychotic Symptoms:No data recorded Hallucinations:Hallucinations: None  Ideas of Reference:None  Suicidal Thoughts:Suicidal Thoughts: Yes, Active SI Active Intent and/or Plan: With Intent; With Plan; With Means to Carry Out; With Access to Means  Homicidal Thoughts:Homicidal Thoughts: No   Sensorium  Memory: Immediate Good; Recent Good; Remote  Fair  Judgment: Fair  Insight: Present   Executive Functions  Concentration: Fair  Attention Span: Good  Recall: Good  Fund of Knowledge: Fair  Language: Good   Psychomotor Activity  Psychomotor Activity: Psychomotor Activity: Normal   Assets  Assets: Communication Skills; Desire for Improvement; Financial Resources/Insurance; Housing; Leisure Time; Transportation; Social Support; Physical Health; Talents/Skills; Resilience    Sleep  Sleep: Sleep: Good   Physical Exam: Physical Exam Vitals and nursing note reviewed.  Constitutional:      Appearance: Normal appearance. He is obese.  HENT:     Head: Normocephalic and atraumatic.     Nose: Nose normal.  Cardiovascular:     Rate and Rhythm: Regular rhythm. Tachycardia present.  Pulmonary:     Effort: Pulmonary effort is normal.  Musculoskeletal:        General: Normal range of motion.     Cervical back: Normal range of motion.  Skin:    General: Skin is warm and dry.     Comments: Self inflicted left fore arm cut that required  sutures.  Neurological:     Mental Status: He is alert and oriented to person, place, and time.  Psychiatric:        Mood and Affect: Mood normal.        Behavior: Behavior normal.        Thought Content: Thought content normal.        Judgment: Judgment normal.   Review of Systems  Constitutional: Negative.   HENT: Negative.    Eyes: Negative.   Respiratory: Negative.    Cardiovascular: Negative.   Gastrointestinal: Negative.   Genitourinary: Negative.   Musculoskeletal: Negative.   Skin:        Self inflicted left fore arm cut that required  sutures.  Neurological: Negative.   Endo/Heme/Allergies: Negative.   Psychiatric/Behavioral:  Positive for depression and suicidal ideas. The patient is nervous/anxious.    Blood pressure 122/89, pulse (!) 114, temperature 98 F (36.7 C), resp. rate 17, SpO2 98%. There is no height or weight on file to calculate  BMI.  Medical Decision Making: We will seek inpatient Psychiatric admission for safety and stabilization.  Home Medications are resumed and we will fax out records to area facilities if [facility] does not have a bed.  Currently denies SI/HI/AVH.    Problem 1: Suicide attempt-self inflicted cut  Problem 2: Borderline Personality Disorder-will benefit from crisis stabilizaiton and structure.   Disposition:  admit, seek bed placement.  Maryagnes Amos, FNP-PMHNP-BC 08/19/2023  11:23 AM

## 2023-08-19 NOTE — ED Notes (Addendum)
Report given at 1700 to Darl Pikes, Charity fundraiser at [facility].

## 2023-08-19 NOTE — ED Provider Triage Note (Signed)
Emergency Medicine Provider Triage Evaluation Note  Antonio [name] , a 17 y.o. adult  was evaluated in triage.  Pt complains of self-inflicted laceration to left arm with razor blade.  No longer feeling suicidal.  Denies any alcohol or drug use.  States compliance with medications..  Review of Systems  Positive: Self-inflicted laceration Negative: Weakness, numbness, tingling  Physical Exam  BP 131/76 (BP Location: Left Arm)   Pulse (!) 106   Temp 98.4 F (36.9 C)   Resp 18   SpO2 98%  Gen:   Awake, no distress   Resp:  Normal effort  MSK:   Moves extremities without difficulty  Other:  Approximately 10 cm long laceration to volar left arm, adipose tissue exposed.  Full range of motion of all fingers and wrist.  Intact radial pulse.  Hemostatic.  Multiple other superficial laceration  Medical Decision Making  Medically screening exam initiated at 6:18 AM.  Appropriate orders placed.  Antonio [name] was informed that the remainder of the evaluation will be completed by another provider, this initial triage assessment does not replace that evaluation, and the importance of remaining in the ED until their evaluation is complete.  Will need staples or sutures.  Tetanus is up-to-date.  Medical clearance labs and TTS consult ordered.   Antonio Octave, MD 08/19/23 [phone]

## 2023-08-19 NOTE — ED Notes (Signed)
RN wrapped wound and put bactrim on wounds

## 2023-08-19 NOTE — ED Provider Notes (Signed)
[facility] Provider Note   CSN: [id number] Arrival date & time: 08/19/23  0251     History  Chief Complaint  Patient presents with   Suicide Attempt    Antonio [name] is a 17 y.o. adult.  Pt is a 17 yo trans male with pmhx significant for gender dysphoria, ASD, asthma and depression.  He was feeling bad early this am and cut himself with a razor.  He felt suicidal.  Pt said this has been a rough month.  He has been compliant with his medications. He denies any other sx.  His mom and step dad drove him here this am. No SI currently.  He said it comes and goes.       Home Medications Prior to Admission medications   Medication Sig Start Date End Date Taking? Authorizing Provider  ARIPiprazole (ABILIFY) 15 MG tablet TAKE 1 TABLET BY MOUTH EVERYDAY AT BEDTIME 07/04/23  Yes [name], MD  busPIRone (BUSPAR) 10 MG tablet TAKE 1 TABLET BY MOUTH TWICE A DAY 06/24/23  Yes [name], MD  EPINEPHrine (EPIPEN 2-PAK) 0.3 mg/0.3 mL IJ SOAJ injection Inject 0.3 mg into the muscle as needed for anaphylaxis (AS DIRECTED). 06/08/23  Yes Padgett, Pilar Grammes, MD  naltrexone (DEPADE) 50 MG tablet Take 1 tablet (50 mg total) by mouth daily. 07/18/23  Yes [name], MD  omeprazole (PRILOSEC) 40 MG capsule Take 1 capsule (40 mg total) by mouth daily before breakfast. 06/08/23  Yes Padgett, Pilar Grammes, MD  PATADAY 0.1 % ophthalmic solution Place 1 drop into both eyes 3 (three) times daily as needed for allergies.   Yes [provider]  propranolol (INDERAL) 10 MG tablet TAKE 1 TABLET BY MOUTH EVERYDAY AT BEDTIME 06/13/23  Yes [name], MD  Testosterone Cypionate 200 MG/ML SOLN Inject 80 mg into the skin every Sunday. Patient taking differently: Inject 80 mg into the skin every Wednesday. 11/14/22  Yes Dessa Phi, MD  traZODone (DESYREL) 50 MG tablet TAKE 1-2 TABLETS (50-100 MG TOTAL) BY MOUTH AT BEDTIME AS NEEDED  FOR SLEEP. FOR SLEEP 06/24/23  Yes [name], MD  venlafaxine XR (EFFEXOR-XR) 150 MG 24 hr capsule Take 1 capsule (150 mg total) by mouth daily with breakfast. 07/18/23  Yes [name], MD  albuterol (VENTOLIN HFA) 108 (90 Base) MCG/ACT inhaler Inhale 1-2 puffs into the lungs every 4 (four) hours as needed for wheezing or shortness of breath. 06/08/23   Marcelyn Bruins, MD      Allergies    Egg-derived products, Bevelyn Buckles, and Peanut-containing drug products    Review of Systems   Review of Systems  Skin:  Positive for wound.  Psychiatric/Behavioral:  Positive for suicidal ideas.   All other systems reviewed and are negative.   Physical Exam Updated Vital Signs BP 122/89   Pulse (!) 114   Temp 98 F (36.7 C)   Resp 17   SpO2 98%  Physical Exam Vitals and nursing note reviewed.  Constitutional:      Appearance: Normal appearance. He is obese.  HENT:     Head: Normocephalic and atraumatic.     Right Ear: External ear normal.     Left Ear: External ear normal.     Nose: Nose normal.     Mouth/Throat:     Mouth: Mucous membranes are moist.     Pharynx: Oropharynx is clear.  Eyes:     Extraocular  Movements: Extraocular movements intact.     Conjunctiva/sclera: Conjunctivae normal.     Pupils: Pupils are equal, round, and reactive to light.  Cardiovascular:     Rate and Rhythm: Normal rate and regular rhythm.     Pulses: Normal pulses.     Heart sounds: Normal heart sounds.  Pulmonary:     Effort: Pulmonary effort is normal.     Breath sounds: Normal breath sounds.  Chest:     Comments: S/p top surgery Abdominal:     General: Abdomen is flat. Bowel sounds are normal.     Palpations: Abdomen is soft.  Musculoskeletal:        General: Normal range of motion.     Cervical back: Normal range of motion and neck supple.  Skin:    General: Skin is warm.     Capillary Refill: Capillary refill takes less than 2 seconds.     Comments: Large lac  left forearm.  No arterial, nerve or tendon injury.  Multiple old cutting scars  Neurological:     General: No focal deficit present.     Mental Status: He is alert and oriented to person, place, and time.  Psychiatric:        Mood and Affect: Mood normal.        Behavior: Behavior is cooperative.        Thought Content: Thought content includes suicidal ideation.    ED Results / Procedures / Treatments   Labs (all labs ordered are listed, but only abnormal results are displayed) Labs Reviewed  COMPREHENSIVE METABOLIC PANEL - Abnormal; Notable for the following components:      Result Value   Sodium 133 (*)    All other components within normal limits  SALICYLATE LEVEL - Abnormal; Notable for the following components:   Salicylate Lvl <7.0 (*)    All other components within normal limits  ACETAMINOPHEN LEVEL - Abnormal; Notable for the following components:   Acetaminophen (Tylenol), Serum <10 (*)    All other components within normal limits  CBC - Abnormal; Notable for the following components:   MCV 75.7 (*)    MCH 23.9 (*)    All other components within normal limits  ETHANOL  RAPID URINE DRUG SCREEN, HOSP PERFORMED  HCG, SERUM, QUALITATIVE    EKG None  Radiology No results found.  Procedures .Laceration Repair  Date/Time: 08/19/2023 8:08 AM  Performed by: Jacalyn Lefevre, MD Authorized by: Jacalyn Lefevre, MD   Consent:    Consent obtained:  Verbal   Consent given by:  Patient   Risks discussed:  Infection and pain Universal protocol:    Procedure explained and questions answered to patient or proxy's satisfaction: yes     Patient identity confirmed:  Verbally with patient Anesthesia:    Anesthesia method:  Local infiltration   Local anesthetic:  Lidocaine 2% WITH epi Laceration details:    Location:  Shoulder/arm   Shoulder/arm location:  L lower arm   Length (cm):  10 Pre-procedure details:    Preparation:  Patient was prepped and draped in usual  sterile fashion Exploration:    Contaminated: no   Treatment:    Area cleansed with:  Povidone-iodine and saline   Amount of cleaning:  Standard Skin repair:    Repair method:  Sutures   Suture size:  4-0   Suture material:  Prolene   Suture technique:  Simple interrupted   Number of sutures:  14 Approximation:    Approximation:  Close Repair  type:    Repair type:  Intermediate Post-procedure details:    Dressing:  Antibiotic ointment and non-adherent dressing   Procedure completion:  Tolerated well, no immediate complications     Medications Ordered in ED Medications  bacitracin ointment (1 Application Topical Given 08/19/23 0802)  ARIPiprazole (ABILIFY) tablet 15 mg (has no administration in time range)  busPIRone (BUSPAR) tablet 10 mg (has no administration in time range)  naltrexone (DEPADE) tablet 50 mg (has no administration in time range)  propranolol (INDERAL) tablet 10 mg (has no administration in time range)  Testosterone Cypionate SOLN 80 mg (has no administration in time range)  traZODone (DESYREL) tablet 50-100 mg (has no administration in time range)  venlafaxine XR (EFFEXOR-XR) 24 hr capsule 150 mg (has no administration in time range)  albuterol (PROVENTIL) (2.5 MG/3ML) 0.083% nebulizer solution 2.5 mg (has no administration in time range)  lidocaine-EPINEPHrine (XYLOCAINE W/EPI) 2 %-1:200000 (PF) injection 10 mL (10 mLs Infiltration Given 08/19/23 0800)    ED Course/ Medical Decision Making/ A&P                                 Medical Decision Making Amount and/or Complexity of Data Reviewed Labs: ordered.  Risk OTC drugs. Prescription drug management.   This patient presents to the ED for concern of si, this involves an extensive number of treatment options, and is a complaint that carries with it a high risk of complications and morbidity.  The differential diagnosis includes si   Co morbidities that complicate the patient evaluation  gender  dysphoria, ASD, asthma and depression   Additional history obtained:  Additional history obtained from epic chart review  Lab Tests:  I Ordered, and personally interpreted labs.  The pertinent results include:  cbc nl, preg neg, cmp nl, etoh neg, sal and acet neg, uds neg   Medicines ordered and prescription drug management:   I have reviewed the patients home medicines and have made adjustments as needed  Critical Interventions:  ;ac repair   Consultations Obtained:  I requested consultation with TTS,  and discussed lab and imaging findings as well as pertinent plan - consult pending   Problem List / ED Course:  SI:  pt said he gets very sad and has been feeling suicidal with more frequency.  TTS has seen pt and he is voluntary.   Lac:  repaired.  Sutures will need to be removed in 7-10 days.   Reevaluation:  After the interventions noted above, I reevaluated the patient and found that they have :improved   Social Determinants of Health:  Lives at home with mom and step dad   Dispostion:  After consideration of the diagnostic results and the patients response to treatment, I feel that the patent would benefit from TTS eval.          Final Clinical Impression(s) / ED Diagnoses Final diagnoses:  Suicide attempt [facility])  Laceration of left wrist, initial encounter    Rx / DC Orders ED Discharge Orders     None         Jacalyn Lefevre, MD 08/19/23 1325

## 2023-08-19 NOTE — ED Notes (Signed)
Safe transport called for patient pick up eta 30 minutes

## 2023-08-20 ENCOUNTER — Encounter: Payer: Self-pay | Admitting: Family

## 2023-08-20 DIAGNOSIS — F332 Major depressive disorder, recurrent severe without psychotic features: Secondary | ICD-10-CM

## 2023-08-20 NOTE — Progress Notes (Signed)
Patient admitted from [facility] Emergency Department for feeling suicidal and depressed under voluntary committment. He did cut left lower wrist and required 14 stitches. Patient is transgender male to male  Patient is oriented x5 but he is unable to report what may have been a trigger, he does report having a history of cutting . He endorses anxiety and depression. He has been cooperative with treatment thus far.

## 2023-08-20 NOTE — Group Note (Signed)
Date:  08/20/2023 Time:  11:18 AM  Group Topic/Focus:  Emotional Education:   The focus of this group is to discuss what feelings/emotions are, and how they are experienced.    Participation Level:  Active  Participation Quality:  Appropriate  Affect:  Appropriate  Cognitive:  Appropriate  Insight: Appropriate  Engagement in Group:  Distracting  Modes of Intervention:  Activity  Additional Comments:    Asley Baskerville 08/20/2023, 11:18 AM

## 2023-08-20 NOTE — H&P (Signed)
Psychiatric Admission Assessment Adult  Patient Identification: Antonio [name] MRN:  [id number] Date of Evaluation:  08/20/2023 Chief Complaint:  MDD (major depressive disorder), recurrent episode, severe (HCC) [F33.2] Principal Diagnosis: MDD (major depressive disorder), recurrent episode, severe (HCC) Diagnosis:  Principal Problem:   MDD (major depressive disorder), recurrent episode, severe (HCC) CC: 17 year old transgender male admitted on a voluntary status with symptoms of depression and suicide attempt by cutting his left wrist requiring 14 stitches.  History of Present Illness: The patient is a 17 year old male with a past history of self-mutilation who lives with his mother and stepfather.  According to the patient's self-report and the notes the patient gives a history of isolating himself for the last several weeks.  He denies any specific trigger or stress disorder.  He states that he got hold of the blade of a pencil sharpener and cut his left wrist serious enough to require 14 stitches.  As noted earlier that he has a history of self-harm but he is not sure whether this was a suicidal attempt.  He is unable to articulate his intention about why he cut so deeply this time.  He reports good compliance with his medications and states that he lives with a supportive family members that includes his stepfather and mother.  He is currently in the process of transitioning from male to male and is taking testosterone injections.  He gives a past psychiatry history of anxiety, gender dysphoria and recurrent major depression.  He has had multiple psychiatric hospitalizations in the past.  He was in the adolescent unit in May 2024 at [facility] and he reports that he has been compliant with his medications.  He denies any alcohol or substance abuse although he has occasionally used marijuana.  When seen today the patient was alert and oriented cooperative but appeared quite dysphoric.  He  maintained fair to good eye contact.  His speech was of low volume with some latency but no obvious looseness of associations flight of ideas or tangentiality.  Patient is able to contract for safety.  He denies any auditory or visual hallucinations.  Associated Signs/Symptoms: Depression Symptoms:  depressed mood, difficulty concentrating, suicidal attempt, (Hypo) Manic Symptoms:  Impulsivity, Anxiety Symptoms:   Denied any anxiety. Psychotic Symptoms:   Denies any paranoia. PTSD Symptoms: Negative Total Time spent with patient: 30 minutes  Past Psychiatric History: Patient has a long past history of multiple prior hospitalizations.  He is also with outpatient treatment and has been compliant with medications.  His last admission to the adolescent unit at [facility] was in May 2024.  Is the patient at risk to self? Yes.    Has the patient been a risk to self in the past 6 months? Yes.    Has the patient been a risk to self within the distant past? Yes.    Is the patient a risk to others? No.  Has the patient been a risk to others in the past 6 months? No.  Has the patient been a risk to others within the distant past? No.   [place] Scale:  Flowsheet Row Admission (Current) from 08/19/2023 in [facility] INPATIENT BEHAVIORAL MEDICINE Most recent reading at 08/20/2023  5:34 AM ED from 08/19/2023 in [facility] Emergency Department at [facility] Most recent reading at 08/19/2023  4:27 AM [facility] ([facility] Most recent reading at 02/09/2023 10:56 PM  C-SSRS RISK CATEGORY High Risk Error: Question 6 not populated  High Risk        Prior Inpatient Therapy: Yes.   If yes, describe last admission in May 2024 Prior Outpatient Therapy: Yes.   If yes, describe patient has outpatient follow-up with [facility].  Alcohol Screening: 1. How often do you have a drink containing alcohol?: Never 2. How  many drinks containing alcohol do you have on a typical day when you are drinking?: 1 or 2 3. How often do you have six or more drinks on one occasion?: Never AUDIT-C Score: 0 4. How often during the last year have you found that you were not able to stop drinking once you had started?: Never 5. How often during the last year have you failed to do what was normally expected from you because of drinking?: Never 6. How often during the last year have you needed a first drink in the morning to get yourself going after a heavy drinking session?: Never 7. How often during the last year have you had a feeling of guilt of remorse after drinking?: Never 8. How often during the last year have you been unable to remember what happened the night before because you had been drinking?: Never 9. Have you or someone else been injured as a result of your drinking?: No 10. Has a relative or friend or a doctor or another health worker been concerned about your drinking or suggested you cut down?: No Alcohol Use Disorder Identification Test Final Score (AUDIT): 0 Alcohol Brief Interventions/Follow-up: Alcohol education/Brief advice Substance Abuse History in the last 12 months:  No. 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   Asthma    Bupropion overdose 01/16/2020   Complication of anesthesia    gets disoriented and shakes after surgery   Deliberate self-cutting 10/17/2018   Depression    Eczema    Fracture of 5th metatarsal    Gender dysphoria    Intentional overdose of drug in tablet form (HCC) 04/03/2020   MDD (major depressive disorder), recurrent severe, without psychosis (HCC) 11/02/2021   Moderate episode of recurrent major depressive disorder (HCC) 04/24/2020   Multiple  allergies    Severe episode of recurrent major depressive disorder, without psychotic features (HCC) 01/02/2020   Suicidal ideations 08/31/2022   Suicide attempt by cutting of wrist (HCC) 07/28/2022    Past Surgical History:  Procedure Laterality Date   SUPPRELIN IMPLANT Left 01/22/2019   Procedure: SUPPRELIN IMPLANT;  Surgeon: Kandice Hams, MD;  Location: [facility];  Service: Pediatrics;  Laterality: Left;   SUPPRELIN REMOVAL N/A 03/03/2020   Procedure: SUPPRELIN REMOVAL;  Surgeon: Kandice Hams, MD;  Location: [facility];  Service: Pediatrics;  Laterality: N/A;   TONSILLECTOMY     TYMPANOSTOMY TUBE PLACEMENT     Family History:  Family History  Problem Relation Age of Onset   Depression Mother    Anxiety disorder Mother    Hypertension Maternal Grandmother    Allergic rhinitis Neg Hx    Angioedema Neg Hx    Atopy Neg Hx    Eczema Neg Hx    Immunodeficiency Neg Hx    Urticaria Neg Hx    Family Psychiatric  History: Unknown at this time. Tobacco Screening:  Social History   Tobacco Use  Smoking Status Never  Smokeless Tobacco Never  BH Tobacco Counseling     Are you interested in Tobacco Cessation Medications?  No value filed. Counseled patient on smoking cessation:  No value filed. Reason Tobacco Screening Not Completed: No value filed.       Social History:  Social History   Substance and Sexual Activity  Alcohol Use No     Social History   Substance and Sexual Activity  Drug Use Not Currently   Types: Marijuana    Additional Social History:                           Allergies:   Allergies  Allergen Reactions   Egg-Derived Products Anaphylaxis   Justicia Adhatoda Anaphylaxis and Other (See Comments)    All "TREE NUTS"   Peanut-Containing Drug Products Anaphylaxis   Lab Results:  Results for orders placed or performed during the hospital encounter of 08/19/23 (from the past 48 hour(s))  Comprehensive  metabolic panel     Status: Abnormal   Collection Time: 08/19/23  5:57 AM  Result Value Ref Range   Sodium 133 (L) 135 - 145 mmol/L   Potassium 3.8 3.5 - 5.1 mmol/L   Chloride 99 98 - 111 mmol/L   CO2 23 22 - 32 mmol/L   Glucose, Bld 77 70 - 99 mg/dL    Comment: Glucose reference range applies only to samples taken after fasting for at least 8 hours.   BUN 12 6 - 20 mg/dL   Creatinine, Ser 1.32 0.61 - 1.24 mg/dL   Calcium 9.2 8.9 - 44.0 mg/dL   Total Protein 7.7 6.5 - 8.1 g/dL   Albumin 4.1 3.5 - 5.0 g/dL   AST 22 15 - 41 U/L   ALT 26 0 - 44 U/L   Alkaline Phosphatase 77 38 - 126 U/L   Total Bilirubin 1.0 <1.2 mg/dL   GFR, Estimated >10 >27 mL/min    Comment: (NOTE) Calculated using the CKD-EPI Creatinine Equation (2021)    Anion gap 11 5 - 15    Comment: Performed at [facility], [address] [place], [place] [address]  Ethanol     Status: None   Collection Time: 08/19/23  5:57 AM  Result Value Ref Range   Alcohol, Ethyl (B) <10 <10 mg/dL    Comment: (NOTE) Lowest detectable limit for serum alcohol is 10 mg/dL.  For medical purposes only. Performed at [facility], [address] [place], [place] [address]   Salicylate level     Status: Abnormal   Collection Time: 08/19/23  5:57 AM  Result Value Ref Range   Salicylate Lvl <7.0 (L) 7.0 - 30.0 mg/dL    Comment: Performed at [facility], [address] [place], [place] [address]  Acetaminophen level     Status: Abnormal   Collection Time: 08/19/23  5:57 AM  Result Value Ref Range   Acetaminophen (Tylenol), Serum <10 (L) 10 - 30 ug/mL    Comment: (NOTE) Therapeutic concentrations vary significantly. A range of 10-30 ug/mL  may be an effective concentration for many patients. However, some  are best treated at concentrations outside of this range. Acetaminophen concentrations >150 ug/mL at 4 hours after ingestion  and >50 ug/mL at 12 hours after ingestion are often associated with  toxic  reactions.  Performed at [facility], [address] [place], [place] [address]   cbc     Status: Abnormal   Collection Time:  08/19/23  5:57 AM  Result Value Ref Range   WBC 5.2 4.0 - 10.5 K/uL   RBC 5.69 4.22 - 5.81 MIL/uL   Hemoglobin 13.6 13.0 - 17.0 g/dL   HCT 16.1 09.6 - 04.5 %   MCV 75.7 (L) 80.0 - 100.0 fL   MCH 23.9 (L) 26.0 - 34.0 pg   MCHC 31.6 30.0 - 36.0 g/dL   RDW 40.9 81.1 - 91.4 %   Platelets 300 150 - 400 K/uL   nRBC 0.0 0.0 - 0.2 %    Comment: Performed at [facility], [address] [place], [place] [address]  hCG, serum, qualitative     Status: None   Collection Time: 08/19/23  5:57 AM  Result Value Ref Range   Preg, Serum NEGATIVE NEGATIVE    Comment:        THE SENSITIVITY OF THIS METHODOLOGY IS >10 mIU/mL. Performed at [facility], [address] [place], [place] [address]   Rapid urine drug screen (hospital performed)     Status: None   Collection Time: 08/19/23  6:00 AM  Result Value Ref Range   Opiates NONE DETECTED NONE DETECTED   Cocaine NONE DETECTED NONE DETECTED   Benzodiazepines NONE DETECTED NONE DETECTED   Amphetamines NONE DETECTED NONE DETECTED   Tetrahydrocannabinol NONE DETECTED NONE DETECTED   Barbiturates NONE DETECTED NONE DETECTED    Comment: (NOTE) DRUG SCREEN FOR MEDICAL PURPOSES ONLY.  IF CONFIRMATION IS NEEDED FOR ANY PURPOSE, NOTIFY LAB WITHIN 5 DAYS.  LOWEST DETECTABLE LIMITS FOR URINE DRUG SCREEN Drug Class                     Cutoff (ng/mL) Amphetamine and metabolites    1000 Barbiturate and metabolites    200 Benzodiazepine                 200 Opiates and metabolites        300 Cocaine and metabolites        300 THC                            50 Performed at [facility], [address] [place], [place] [address]     Blood Alcohol level:  Lab Results  Component Value Date   [facility] <10 08/19/2023   ETH <10 02/09/2023    Metabolic Disorder Labs:  Lab Results  Component Value  Date   HGBA1C 5.1 12/30/2022   MPG 99.67 12/30/2022   MPG 105 09/01/2022   Lab Results  Component Value Date   PROLACTIN 10.0 09/01/2022   PROLACTIN 10.3 04/16/2022   Lab Results  Component Value Date   CHOL 200 (H) 12/30/2022   TRIG 114 12/30/2022   HDL 40 (L) 12/30/2022   CHOLHDL 5.0 12/30/2022   VLDL 23 12/30/2022   LDLCALC 137 (H) 12/30/2022   LDLCALC 170 (H) 09/01/2022    Current Medications: Current Facility-Administered Medications  Medication Dose Route Frequency Provider Last Rate Last Admin   acetaminophen (TYLENOL) tablet 650 mg  650 mg Oral Q6H PRN Maryagnes Amos, FNP   650 mg at 08/20/23 0750   alum & mag hydroxide-simeth (MAALOX/MYLANTA) 200-200-20 MG/5ML suspension 30 mL  30 mL Oral Q4H PRN Maryagnes Amos, FNP       ARIPiprazole (ABILIFY) tablet 15 mg  15 mg Oral QHS Maryagnes Amos, FNP   15 mg at 08/19/23 2112  busPIRone (BUSPAR) tablet 10 mg  10 mg Oral BID Maryagnes Amos, FNP   10 mg at 08/20/23 0749   haloperidol (HALDOL) tablet 5 mg  5 mg Oral TID PRN Maryagnes Amos, FNP       And   diphenhydrAMINE (BENADRYL) capsule 50 mg  50 mg Oral TID PRN Starkes-Perry, Juel Burrow, FNP       magnesium hydroxide (MILK OF MAGNESIA) suspension 30 mL  30 mL Oral Daily PRN Starkes-Perry, Juel Burrow, FNP       naltrexone (DEPADE) tablet 50 mg  50 mg Oral Daily Maryagnes Amos, FNP   50 mg at 08/20/23 0751   propranolol (INDERAL) tablet 10 mg  10 mg Oral QHS Maryagnes Amos, FNP   10 mg at 08/19/23 2113   traZODone (DESYREL) tablet 50-100 mg  50-100 mg Oral QHS PRN Maryagnes Amos, FNP       venlafaxine XR (EFFEXOR-XR) 24 hr capsule 150 mg  150 mg Oral Q breakfast Maryagnes Amos, FNP   150 mg at 08/20/23 0750   PTA Medications: Medications Prior to Admission  Medication Sig Dispense Refill Last Dose   albuterol (VENTOLIN HFA) 108 (90 Base) MCG/ACT inhaler Inhale 1-2 puffs into the lungs every 4 (four) hours as  needed for wheezing or shortness of breath. 18 g 1    ARIPiprazole (ABILIFY) 15 MG tablet TAKE 1 TABLET BY MOUTH EVERYDAY AT BEDTIME 90 tablet 1    busPIRone (BUSPAR) 10 MG tablet TAKE 1 TABLET BY MOUTH TWICE A DAY 180 tablet 1    EPINEPHrine (EPIPEN 2-PAK) 0.3 mg/0.3 mL IJ SOAJ injection Inject 0.3 mg into the muscle as needed for anaphylaxis (AS DIRECTED). 2 each 1    naltrexone (DEPADE) 50 MG tablet Take 1 tablet (50 mg total) by mouth daily. 90 tablet 1    omeprazole (PRILOSEC) 40 MG capsule Take 1 capsule (40 mg total) by mouth daily before breakfast. 30 capsule 11    PATADAY 0.1 % ophthalmic solution Place 1 drop into both eyes 3 (three) times daily as needed for allergies.      propranolol (INDERAL) 10 MG tablet TAKE 1 TABLET BY MOUTH EVERYDAY AT BEDTIME 90 tablet 1    Testosterone Cypionate 200 MG/ML SOLN Inject 80 mg into the skin every Sunday. (Patient taking differently: Inject 80 mg into the skin every Wednesday.) 4 mL 1    traZODone (DESYREL) 50 MG tablet TAKE 1-2 TABLETS (50-100 MG TOTAL) BY MOUTH AT BEDTIME AS NEEDED FOR SLEEP. FOR SLEEP 180 tablet 1    venlafaxine XR (EFFEXOR-XR) 150 MG 24 hr capsule Take 1 capsule (150 mg total) by mouth daily with breakfast. 90 capsule 1     Musculoskeletal: Strength & Muscle Tone: within normal limits Gait & Station: normal Patient leans: N/A            Psychiatric Specialty Exam:  Presentation  General Appearance:  Casual; Disheveled  Eye Contact: Fair  Speech: Clear and Coherent  Speech Volume: Decreased  Handedness: Right   Mood and Affect  Mood: Anxious; Depressed  Affect: Restricted   Thought Process  Thought Processes: Coherent  Duration of Psychotic Symptoms:N/A Past Diagnosis of Schizophrenia or Psychoactive disorder: No data recorded Descriptions of Associations:Intact  Orientation:Full (Time, Place and Person)  Thought Content:Rumination; Perseveration  Hallucinations:Hallucinations:  None  Ideas of Reference:None  Suicidal Thoughts:Suicidal Thoughts: Yes, Passive SI Active Intent and/or Plan: With Intent; With Plan; With Means to Carry Out; With Access to Means  SI Passive Intent and/or Plan: Without Intent; Without Plan  Homicidal Thoughts:Homicidal Thoughts: No   Sensorium  Memory: Immediate Fair; Remote Fair; Recent Fair  Judgment: Poor  Insight: Fair   Chartered certified accountant: Fair  Attention Span: Fair  Recall: [employer] of Knowledge: Fair  Language: Fair   Psychomotor Activity  Psychomotor Activity: Psychomotor Activity: Normal   Assets  Assets: Manufacturing systems engineer; Desire for Improvement; Housing   Sleep  Sleep: Sleep: Fair    Physical Exam: Physical Exam Constitutional:      Appearance: Normal appearance.  Neurological:     General: No focal deficit present.     Mental Status: He is alert and oriented to person, place, and time. Mental status is at baseline.    Review of Systems  Psychiatric/Behavioral:  Positive for depression and suicidal ideas. The patient is nervous/anxious.   All other systems reviewed and are negative.  Blood pressure 131/82, pulse 93, temperature (!) 97.4 F (36.3 C), resp. rate 19, SpO2 99%. There is no height or weight on file to calculate BMI.  Treatment Plan Summary: Daily contact with patient to assess and evaluate symptoms and progress in treatment and Medication management  Observation Level/Precautions:  15 minute checks  Laboratory:  CBC Chemistry Profile HbAIC  Psychotherapy: Individual and group therapy.  Medications: Restart home medications that include: Abilify 15 mg a day at bedtime BuSpar 10 mg twice a day Naltrexone 50 mg a day Propranolol 10 mg at bedtime Effexor XR 150 mg a day  Consultations:    Discharge Concerns: Compliance with treatment and outpatient follow-up  Estimated LOS: 5 to 7 days  Other:     Physician Treatment Plan for Primary  Diagnosis: MDD (major depressive disorder), recurrent episode, severe (HCC) Long Term Goal(s): Improvement in symptoms so as ready for discharge  Short Term Goals: Ability to verbalize feelings will improve, Ability to disclose and discuss suicidal ideas, and Ability to demonstrate self-control will improve  Physician Treatment Plan for Secondary Diagnosis: Principal Problem:   MDD (major depressive disorder), recurrent episode, severe (HCC)  Long Term Goal(s): Improvement in symptoms so as ready for discharge  Short Term Goals: Ability to identify and develop effective coping behaviors will improve, Ability to maintain clinical measurements within normal limits will improve, Compliance with prescribed medications will improve, and Ability to identify triggers associated with substance abuse/mental health issues will improve  I certify that inpatient services furnished can reasonably be expected to improve the patient's condition.    Rex Kras, MD 12/7/202411:38 AM

## 2023-08-20 NOTE — Plan of Care (Signed)
Patient is newly admitted and requires stabilization of psychiatric symptoms.  

## 2023-08-20 NOTE — Progress Notes (Signed)
D- Patient alert and oriented. Affect/mood. Denies SI/ HI/ AVH. Patient denies pain. Patient endorses depression and anxiety. Goal of the day. A- Scheduled medications administered to patient, per MD orders. Support and encouragement provided.  Routine safety checks conducted every 15 minutes without incident.  Patient informed to notify staff with problems or concerns and verbalizes understanding. R- No adverse drug reactions noted.  Patient compliant with medications and treatment plan. Patient receptive, calm cooperative and interacts well with others on the unit.  Patient contracts for safety and  remains safe on the unit at this time.

## 2023-08-20 NOTE — [facility] Suicide Risk Assessment (Signed)
[facility] Admission Suicide Risk Assessment   Nursing information obtained from:    Demographic factors:  Gay, lesbian, or bisexual orientation, Low socioeconomic status, Unemployed Current Mental Status:  Self-harm thoughts Loss Factors:  NA Historical Factors:  Prior suicide attempts, Impulsivity Risk Reduction Factors:  Positive social support, Positive coping skills or problem solving skills, Sense of responsibility to family  Total Time spent with patient: 30 minutes Principal Problem: MDD (major depressive disorder), recurrent episode, severe (HCC) Diagnosis:  Principal Problem:   MDD (major depressive disorder), recurrent episode, severe (HCC)  Subjective Data: The patient is a 17 year old transgendered male who was admitted on involuntary status with depression and suicidal ideations.  He cut his left lower wrist which required 14 stitches.  Continued Clinical Symptoms:  Alcohol Use Disorder Identification Test Final Score (AUDIT): 0 The "Alcohol Use Disorders Identification Test", Guidelines for Use in Primary Care, Second Edition.  World Science writer [facility]). Score between 0-7:  no or low risk or alcohol related problems. Score between 8-15:  moderate risk of alcohol related problems. Score between 16-19:  high risk of alcohol related problems. Score 20 or above:  warrants further diagnostic evaluation for alcohol dependence and treatment.   CLINICAL FACTORS:   Depression:   Impulsivity Personality Disorders:   Cluster B Previous Psychiatric Diagnoses and Treatments   Musculoskeletal: Strength & Muscle Tone: within normal limits Gait & Station: normal Patient leans: N/A  Psychiatric Specialty Exam:  Presentation  General Appearance:  Appropriate for Environment; Casual  Eye Contact: Good  Speech: Clear and Coherent; Normal Rate  Speech Volume: Normal  Handedness: Right   Mood and Affect  Mood: Dysphoric  Affect: Appropriate; Flat   Thought  Process  Thought Processes: Coherent; Linear  Descriptions of Associations:Intact  Orientation:Full (Time, Place and Person)  Thought Content:WDL  History of Schizophrenia/Schizoaffective disorder:No data recorded Duration of Psychotic Symptoms:No data recorded Hallucinations:Hallucinations: None  Ideas of Reference:None  Suicidal Thoughts:Suicidal Thoughts: Yes, Active SI Active Intent and/or Plan: With Intent; With Plan; With Means to Carry Out; With Access to Means  Homicidal Thoughts:Homicidal Thoughts: No   Sensorium  Memory: Immediate Good; Recent Good; Remote Fair  Judgment: Fair  Insight: Present   Executive Functions  Concentration: Fair  Attention Span: Good  Recall: Good  Fund of Knowledge: Fair  Language: Good   Psychomotor Activity  Psychomotor Activity: Psychomotor Activity: Normal   Assets  Assets: Communication Skills; Desire for Improvement; Financial Resources/Insurance; Housing; Leisure Time; Transportation; Social Support; Physical Health; Talents/Skills; Resilience   Sleep  Sleep: Sleep: Good    Physical Exam: Physical Exam Constitutional:      Appearance: Normal appearance.  Neurological:     General: No focal deficit present.     Mental Status: He is alert and oriented to person, place, and time. Mental status is at baseline.    Review of Systems  Psychiatric/Behavioral:  Positive for depression and suicidal ideas. The patient is nervous/anxious.    Blood pressure 131/82, pulse 93, temperature (!) 97.4 F (36.3 C), resp. rate 19, SpO2 99%. There is no height or weight on file to calculate BMI.   COGNITIVE FEATURES THAT CONTRIBUTE TO RISK:  Polarized thinking and Thought constriction (tunnel vision)    SUICIDE RISK:   Moderate:  Frequent suicidal ideation with limited intensity, and duration, some specificity in terms of plans, no associated intent, good self-control, limited dysphoria/symptomatology, some  risk factors present, and identifiable protective factors, including available and accessible social support.  PLAN OF CARE: The patient  is admitted to [facility] health unit for further observation and stabilization.  Patient is contracting for safety and is willing to restart his home medications.  I certify that inpatient services furnished can reasonably be expected to improve the patient's condition.   Rex Kras, MD 08/20/2023, 11:35 AM

## 2023-08-20 NOTE — Tx Team (Signed)
Initial Treatment Plan 08/20/2023 6:06 AM Antonio [name] ZOX:[id number]    PATIENT STRESSORS: Occupational concerns   Other: sexual dysmorphia     PATIENT STRENGTHS: Average or above average intelligence    PATIENT IDENTIFIED PROBLEMS: Depression  Self harm thoughts                   DISCHARGE CRITERIA:  Improved stabilization in mood, thinking, and/or behavior Reduction of life-threatening or endangering symptoms to within safe limits  PRELIMINARY DISCHARGE PLAN: Return to previous living arrangement  PATIENT/FAMILY INVOLVEMENT: This treatment plan has been presented to and reviewed with the patient, Antonio [name], and/or family member, .  The patient and family have been given the opportunity to ask questions and make suggestions.  Elbert Ewings, RN 08/20/2023, 6:06 AM
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
## 2023-08-20 NOTE — Plan of Care (Signed)
?  Problem: Education: ?Goal: Knowledge of [facility] Education information/materials will improve ?Outcome: Not Progressing ?Goal: Emotional status will improve ?Outcome: Not Progressing ?Goal: Mental status will improve ?Outcome: Not Progressing ?Goal: Verbalization of understanding the information provided will improve ?Outcome: Not Progressing ?  ?Problem: Activity: ?Goal: Interest or engagement in activities will improve ?Outcome: Not Progressing ?Goal: Sleeping patterns will improve ?Outcome: Not Progressing ?  ?Problem: Coping: ?Goal: Ability to verbalize frustrations and anger appropriately will improve ?Outcome: Not Progressing ?Goal: Ability to demonstrate self-control will improve ?Outcome: Not Progressing ?  ?Problem: Health Behavior/Discharge Planning: ?Goal: Identification of resources available to assist in meeting health care needs will improve ?Outcome: Not Progressing ?Goal: Compliance with treatment plan for underlying cause of condition will improve ?Outcome: Not Progressing ?  ?Problem: Physical Regulation: ?Goal: Ability to maintain clinical measurements within normal limits will improve ?Outcome: Not Progressing ?  ?Problem: Safety: ?Goal: Periods of time without injury will increase ?Outcome: Not Progressing ?  ?Problem: Education: ?Goal: Utilization of techniques to improve thought processes will improve ?Outcome: Not Progressing ?Goal: Knowledge of the prescribed therapeutic regimen will improve ?Outcome: Not Progressing ?  ?Problem: Activity: ?Goal: Interest or engagement in leisure activities will improve ?Outcome: Not Progressing ?Goal: Imbalance in normal sleep/wake cycle will improve ?Outcome: Not Progressing ?  ?Problem: Coping: ?Goal: Coping ability will improve ?Outcome: Not Progressing ?Goal: Will verbalize feelings ?Outcome: Not Progressing ?  ?Problem: Health Behavior/Discharge Planning: ?Goal: Ability to make decisions will improve ?Outcome: Not Progressing ?Goal:  Compliance with therapeutic regimen will improve ?Outcome: Not Progressing ?  ?Problem: Role Relationship: ?Goal: Will demonstrate positive changes in social behaviors and relationships ?Outcome: Not Progressing ?  ?Problem: Safety: ?Goal: Ability to disclose and discuss suicidal ideas will improve ?Outcome: Not Progressing ?Goal: Ability to identify and utilize support systems that promote safety will improve ?Outcome: Not Progressing ?  ?Problem: Self-Concept: ?Goal: Will verbalize positive feelings about self ?Outcome: Not Progressing ?Goal: Level of anxiety will decrease ?Outcome: Not Progressing ?  ?

## 2023-08-21 DIAGNOSIS — F332 Major depressive disorder, recurrent severe without psychotic features: Secondary | ICD-10-CM | POA: Diagnosis not present

## 2023-08-21 NOTE — Progress Notes (Signed)
Psychiatric Admission Assessment Adult  Patient Identification: Antonio [name] MRN:  [id number] Date of Evaluation:  08/21/2023 Chief Complaint:  MDD (major depressive disorder), recurrent episode, severe (HCC) [F33.2] Principal Diagnosis: MDD (major depressive disorder), recurrent episode, severe (HCC) Diagnosis:  Principal Problem:   MDD (major depressive disorder), recurrent episode, severe (HCC)   Reason for admission   17 year old transgender male admitted on a voluntary status with symptoms of depression and suicide attempt by cutting his left wrist requiring 14 stitches.  Chart review from last 24 hours   Staff reports that the patient has been cooperative and compliant with treatment.  He complained of a headache and left forearm incisional pain.  His dressing was changed.  Patient received Tylenol as needed for headache.  Yesterday, the psychiatry team made the following recommendations:  Abilify 15 mg a day at bedtime BuSpar 10 mg twice a day Naltrexone 50 mg a day Propranolol 10 mg at bedtime Venlafaxine XR 150 mg a day  Information obtained during interview   The patient was seen and evaluated.  Chart was reviewed.He is alert and oriented cooperative and reports that he feels much better.  He slept well and is in denial of all symptoms claiming that his depression is 0/10 anxiety is a 0/10 and suicidal ideation 0/10.   He reports that he was at home and there was no trigger for him to cut himself.  He went into the bathroom and impulsively cut himself.  He now denies any active suicidal or homicidal ideations.  He states that he has no intention of killing himself and does not understand why he cut himself.  Today is primarily focused on the fact that his allergies limit what he eats and he does not want limitations in his food.  However he is contracting for safety and denies any active SI/HI/AVH.  The plan is to continue current medications Obtain additional collateral  information Safety plan prior to discharge  Associated Signs/Symptoms: Depression Symptoms:  depressed mood, difficulty concentrating, suicidal attempt, (Hypo) Manic Symptoms:  Impulsivity, Anxiety Symptoms:   Denied any anxiety. Psychotic Symptoms:   Denies any paranoia. PTSD Symptoms: Negative Total Time spent with patient: 30 minutes  Past Psychiatric History: Patient has a long past history of multiple prior hospitalizations.  He is also with outpatient treatment and has been compliant with medications.  His last admission to the adolescent unit at [facility] was in May 2024.  Is the patient at risk to self? Yes.    Has the patient been a risk to self in the past 6 months? Yes.    Has the patient been a risk to self within the distant past? Yes.    Is the patient a risk to others? No.  Has the patient been a risk to others in the past 6 months? No.  Has the patient been a risk to others within the distant past? No.   [place] Scale:  Flowsheet Row Admission (Current) from 08/19/2023 in [facility] Most recent reading at 08/20/2023  5:34 AM ED from 08/19/2023 in [facility] Emergency Department at [facility] Most recent reading at 08/19/2023  4:27 AM [facility] Most recent reading at 02/09/2023 10:56 PM  C-SSRS RISK CATEGORY High Risk Error: Question 6 not populated High Risk        Prior Inpatient Therapy: Yes.   If yes, describe last admission in May 2024 Prior Outpatient Therapy: Yes.  If yes, describe patient has outpatient follow-up with [facility].  Alcohol Screening: 1. How often do you have a drink containing alcohol?: Never 2. How many drinks containing alcohol do you have on a typical day when you are drinking?: 1 or 2 3. How often do you have six or more drinks on one occasion?: Never AUDIT-C Score: 0 4. How often during the last  year have you found that you were not able to stop drinking once you had started?: Never 5. How often during the last year have you failed to do what was normally expected from you because of drinking?: Never 6. How often during the last year have you needed a first drink in the morning to get yourself going after a heavy drinking session?: Never 7. How often during the last year have you had a feeling of guilt of remorse after drinking?: Never 8. How often during the last year have you been unable to remember what happened the night before because you had been drinking?: Never 9. Have you or someone else been injured as a result of your drinking?: No 10. Has a relative or friend or a doctor or another health worker been concerned about your drinking or suggested you cut down?: No Alcohol Use Disorder Identification Test Final Score (AUDIT): 0 Alcohol Brief Interventions/Follow-up: Alcohol education/Brief advice Substance Abuse History in the last 12 months:  No. 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   Asthma    Bupropion overdose 01/16/2020   Complication of anesthesia    gets disoriented and shakes after surgery   Deliberate self-cutting 10/17/2018   Depression    Eczema    Fracture of 5th metatarsal    Gender dysphoria    Intentional overdose of drug in tablet form (HCC) 04/03/2020   MDD (major depressive disorder), recurrent severe, without psychosis (HCC) 11/02/2021   Moderate episode of recurrent major depressive disorder (HCC) 04/24/2020   Multiple allergies    Severe episode of recurrent major depressive disorder, without psychotic features (HCC) 01/02/2020   Suicidal ideations 08/31/2022   Suicide attempt by cutting of wrist (HCC) 07/28/2022     Past Surgical History:  Procedure Laterality Date   SUPPRELIN IMPLANT Left 01/22/2019   Procedure: SUPPRELIN IMPLANT;  Surgeon: Kandice Hams, MD;  Location: [facility];  Service: Pediatrics;  Laterality: Left;   SUPPRELIN REMOVAL N/A 03/03/2020   Procedure: SUPPRELIN REMOVAL;  Surgeon: Kandice Hams, MD;  Location: [facility];  Service: Pediatrics;  Laterality: N/A;   TONSILLECTOMY     TYMPANOSTOMY TUBE PLACEMENT     Family History:  Family History  Problem Relation Age of Onset   Depression Mother    Anxiety disorder Mother    Hypertension Maternal Grandmother    Allergic rhinitis Neg Hx    Angioedema Neg Hx    Atopy Neg Hx    Eczema Neg Hx    Immunodeficiency Neg Hx    Urticaria Neg Hx    Family Psychiatric  History: Unknown at this time. Tobacco Screening:  Social History   Tobacco Use  Smoking Status Never  Smokeless Tobacco Never    [facility] Tobacco Counseling     Are you interested in Tobacco Cessation Medications?  No value filed. Counseled patient on smoking cessation:  No value  filed. Reason Tobacco Screening Not Completed: No value filed.       Social History:  Social History   Substance and Sexual Activity  Alcohol Use No     Social History   Substance and Sexual Activity  Drug Use Not Currently   Types: Marijuana    Additional Social History: Marital status: Single Are you sexually active?: No What is your sexual orientation?: undetermined Has your sexual activity been affected by drugs, alcohol, medication, or emotional stress?: na Does patient have children?: No                         Allergies:   Allergies  Allergen Reactions   Egg-Derived Products Anaphylaxis   Justicia Adhatoda Anaphylaxis and Other (See Comments)    All "TREE NUTS"   Peanut-Containing Drug Products Anaphylaxis   Lab Results:  No results found for this or any previous visit (from the past 48 hour(s)).   Blood Alcohol level:   Lab Results  Component Value Date   ETH <10 08/19/2023   ETH <10 02/09/2023    Metabolic Disorder Labs:  Lab Results  Component Value Date   HGBA1C 5.1 12/30/2022   MPG 99.67 12/30/2022   MPG 105 09/01/2022   Lab Results  Component Value Date   PROLACTIN 10.0 09/01/2022   PROLACTIN 10.3 04/16/2022   Lab Results  Component Value Date   CHOL 200 (H) 12/30/2022   TRIG 114 12/30/2022   HDL 40 (L) 12/30/2022   CHOLHDL 5.0 12/30/2022   VLDL 23 12/30/2022   LDLCALC 137 (H) 12/30/2022   LDLCALC 170 (H) 09/01/2022    Current Medications: Current Facility-Administered Medications  Medication Dose Route Frequency Provider Last Rate Last Admin   acetaminophen (TYLENOL) tablet 650 mg  650 mg Oral Q6H PRN Maryagnes Amos, FNP   650 mg at 08/20/23 0750   alum & mag hydroxide-simeth (MAALOX/MYLANTA) 200-200-20 MG/5ML suspension 30 mL  30 mL Oral Q4H PRN Maryagnes Amos, FNP       ARIPiprazole (ABILIFY) tablet 15 mg  15 mg Oral QHS Maryagnes Amos, FNP   15 mg at 08/20/23 2154   busPIRone (BUSPAR) tablet 10 mg  10 mg Oral BID Maryagnes Amos, FNP   10 mg at 08/21/23 1324   haloperidol (HALDOL) tablet 5 mg  5 mg Oral TID PRN Maryagnes Amos, FNP       And   diphenhydrAMINE (BENADRYL) capsule 50 mg  50 mg Oral TID PRN Starkes-Perry, Juel Burrow, FNP       magnesium hydroxide (MILK OF MAGNESIA) suspension 30 mL  30 mL Oral Daily PRN Starkes-Perry, Juel Burrow, FNP       naltrexone (DEPADE) tablet 50 mg  50 mg Oral Daily Maryagnes Amos, FNP   50 mg at 08/21/23 0907   propranolol (INDERAL) tablet 10 mg  10 mg Oral QHS Maryagnes Amos, FNP   10 mg at 08/20/23 2154   traZODone (DESYREL) tablet 50-100 mg  50-100 mg Oral QHS PRN Maryagnes Amos, FNP       venlafaxine XR (EFFEXOR-XR) 24 hr capsule 150 mg  150 mg Oral Q breakfast Maryagnes Amos, FNP   150 mg at 08/21/23 0906   PTA Medications: Medications Prior to Admission  Medication Sig  Dispense Refill Last Dose   albuterol (VENTOLIN HFA) 108 (90 Base) MCG/ACT inhaler Inhale 1-2 puffs into the lungs every 4 (four) hours as needed for wheezing or  shortness of breath. 18 g 1    ARIPiprazole (ABILIFY) 15 MG tablet TAKE 1 TABLET BY MOUTH EVERYDAY AT BEDTIME 90 tablet 1    busPIRone (BUSPAR) 10 MG tablet TAKE 1 TABLET BY MOUTH TWICE A DAY 180 tablet 1    EPINEPHrine (EPIPEN 2-PAK) 0.3 mg/0.3 mL IJ SOAJ injection Inject 0.3 mg into the muscle as needed for anaphylaxis (AS DIRECTED). 2 each 1    naltrexone (DEPADE) 50 MG tablet Take 1 tablet (50 mg total) by mouth daily. 90 tablet 1    omeprazole (PRILOSEC) 40 MG capsule Take 1 capsule (40 mg total) by mouth daily before breakfast. 30 capsule 11    PATADAY 0.1 % ophthalmic solution Place 1 drop into both eyes 3 (three) times daily as needed for allergies.      propranolol (INDERAL) 10 MG tablet TAKE 1 TABLET BY MOUTH EVERYDAY AT BEDTIME 90 tablet 1    Testosterone Cypionate 200 MG/ML SOLN Inject 80 mg into the skin every Sunday. (Patient taking differently: Inject 80 mg into the skin every Wednesday.) 4 mL 1    traZODone (DESYREL) 50 MG tablet TAKE 1-2 TABLETS (50-100 MG TOTAL) BY MOUTH AT BEDTIME AS NEEDED FOR SLEEP. FOR SLEEP 180 tablet 1    venlafaxine XR (EFFEXOR-XR) 150 MG 24 hr capsule Take 1 capsule (150 mg total) by mouth daily with breakfast. 90 capsule 1     Musculoskeletal: Strength & Muscle Tone: within normal limits Gait & Station: normal Patient leans: N/A            Psychiatric Specialty Exam:  Presentation  General Appearance:  Casual  Eye Contact: Fair  Speech: Clear and Coherent  Speech Volume: Normal  Handedness: Right   Mood and Affect  Mood: Dysphoric  Affect: Blunt; Restricted   Thought Process  Thought Processes: Coherent  Duration of Psychotic Symptoms:N/A Past Diagnosis of Schizophrenia or Psychoactive disorder: No data recorded Descriptions of  Associations:Intact  Orientation:Full (Time, Place and Person)  Thought Content:Rumination  Hallucinations:Hallucinations: None  Ideas of Reference:None  Suicidal Thoughts:Suicidal Thoughts: No SI Passive Intent and/or Plan: Without Intent; Without Plan  Homicidal Thoughts:Homicidal Thoughts: No   Sensorium  Memory: Immediate Fair; Recent Fair  Judgment: Fair  Insight: Fair   Chartered certified accountant: Fair  Attention Span: Fair  Recall: [employer] of Knowledge: Fair  Language: Fair   Psychomotor Activity  Psychomotor Activity: Psychomotor Activity: Normal   Assets  Assets: Manufacturing systems engineer; Desire for Improvement; Housing   Sleep  Sleep: Sleep: Fair    Physical Exam: Physical Exam Constitutional:      Appearance: Normal appearance.  Neurological:     General: No focal deficit present.     Mental Status: He is alert and oriented to person, place, and time. Mental status is at baseline.  Psychiatric:        Mood and Affect: Mood normal.        Behavior: Behavior normal.    Review of Systems  Psychiatric/Behavioral:  Positive for depression.   All other systems reviewed and are negative.  Blood pressure 125/77, pulse 96, temperature (!) 94.8 F (34.9 C), resp. rate 18, SpO2 99%. There is no height or weight on file to calculate BMI.  Treatment Plan Summary: Daily contact with patient to assess and evaluate symptoms and progress in treatment and Medication management  Observation Level/Precautions:  15 minute checks  Laboratory:  CBC Chemistry Profile HbAIC  Psychotherapy: Individual and group therapy.  Medications: Restart home medications that  include: Abilify 15 mg a day at bedtime BuSpar 10 mg twice a day Naltrexone 50 mg a day Propranolol 10 mg at bedtime Effexor XR 150 mg a day  Consultations:    Discharge Concerns: Compliance with treatment and outpatient follow-up  Estimated LOS: 4 to 5 days.  Other:      Physician Treatment Plan for Primary Diagnosis: MDD (major depressive disorder), recurrent episode, severe (HCC) Long Term Goal(s): Improvement in symptoms so as ready for discharge  Short Term Goals: Ability to verbalize feelings will improve, Ability to disclose and discuss suicidal ideas, and Ability to demonstrate self-control will improve  Physician Treatment Plan for Secondary Diagnosis: Principal Problem:   MDD (major depressive disorder), recurrent episode, severe (HCC)  Long Term Goal(s): Improvement in symptoms so as ready for discharge  Short Term Goals: Ability to identify and develop effective coping behaviors will improve, Ability to maintain clinical measurements within normal limits will improve, Compliance with prescribed medications will improve, and Ability to identify triggers associated with substance abuse/mental health issues will improve  I certify that inpatient services furnished can reasonably be expected to improve the patient's condition.    Rex Kras, MD 12/8/202410:56 AM Patient ID: Charline Bills, adult   DOB: Dec 23, 2005, 17 y.o.   MRN: [id number]

## 2023-08-21 NOTE — Group Note (Signed)
Date:  08/21/2023 Time:  6:53 PM  Group Topic/Focus:   Structured Activity Group  Participation Level:  Active  Participation Quality:  Appropriate  Affect:  Appropriate  Cognitive:  Alert and Appropriate  Insight: Appropriate  Engagement in Group:  Engaged  Modes of Intervention:  Activity  Additional Comments:    Antonio [name] [name] 08/21/2023, 6:53 PM

## 2023-08-21 NOTE — Group Note (Signed)
Date:  08/21/2023 Time:  8:54 PM  Group Topic/Focus:  Wrap-Up Group:   The focus of this group is to help patients review their daily goal of treatment and discuss progress on daily workbooks.    Participation Level:  Did Not Attend  Participation Quality:   none  Affect:   none  Cognitive:   none  Insight: None  Engagement in Group:   none  Modes of Intervention:   none  Additional Comments:  none  Belva Crome 08/21/2023, 8:54 PM

## 2023-08-21 NOTE — Plan of Care (Signed)
  Problem: Education: Goal: Mental status will improve Outcome: Progressing   Problem: Activity: Goal: Interest or engagement in activities will improve Outcome: Progressing   Problem: Coping: Goal: Coping ability will improve Outcome: Progressing

## 2023-08-21 NOTE — Progress Notes (Deleted)
Patient refuses Suboxone. States he only take "Subutex". He is agitated and argumentative but redirectable.

## 2023-08-21 NOTE — Progress Notes (Signed)
   08/21/23 1000  Psych Admission Type (Psych Patients Only)  Admission Status Involuntary  Psychosocial Assessment  Patient Complaints Depression  Eye Contact Fair  Facial Expression Flat;Sad  Affect Sad  Speech Slow;Soft  Interaction Assertive  Motor Activity Slow  Appearance/Hygiene Unremarkable  Behavior Characteristics Cooperative;Appropriate to situation  Mood Depressed;Euthymic  Thought Process  Coherency WDL  Content Blaming self  Delusions None reported or observed  Perception UTA  Hallucination None reported or observed  Judgment Impaired  Confusion None  Danger to Self  Current suicidal ideation? Denies  Agreement Not to Harm Self Yes  Description of Agreement verbal  Danger to Others  Danger to Others None reported or observed

## 2023-08-21 NOTE — Progress Notes (Signed)
   08/20/23 2000  Psych Admission Type (Psych Patients Only)  Admission Status Involuntary  Psychosocial Assessment  Patient Complaints Depression  Eye Contact Brief  Facial Expression Flat  Affect Sad  Speech Slow  Interaction Assertive  Motor Activity Slow  Appearance/Hygiene Unremarkable  Behavior Characteristics Cooperative;Appropriate to situation  Mood Pleasant  Thought Process  Coherency WDL  Content Blaming self  Delusions None reported or observed  Perception WDL  Hallucination None reported or observed  Judgment Impaired  Confusion None  Danger to Self  Current suicidal ideation? Denies  Agreement Not to Harm Self Yes  Description of Agreement verbal  Danger to Others  Danger to Others None reported or observed   Patient alert and oriented x 4, affect is blunted thoughts are organized, denies SI/HI/AVH, 15 minutes safety checks maintained will continue to monitor.

## 2023-08-21 NOTE — Group Note (Signed)
Date:  08/21/2023 Time:  11:22 AM  Group Topic/Focus:   Goals Group:   The focus of this group is to help patients establish daily goals to achieve during treatment and discuss how the patient can incorporate goal setting into their daily lives to aide in recovery. Managing Feelings:   The focus of this group is to identify what feelings patients have difficulty handling and develop a plan to handle them in a healthier way upon discharge.  Participation Level:  Did Not Attend  Antonio [name] 08/21/2023, 11:22 AM

## 2023-08-22 ENCOUNTER — Telehealth: Payer: 59 | Admitting: Child and Adolescent Psychiatry

## 2023-08-22 DIAGNOSIS — F332 Major depressive disorder, recurrent severe without psychotic features: Secondary | ICD-10-CM | POA: Diagnosis not present

## 2023-08-22 NOTE — BH IP Treatment Plan (Signed)
Interdisciplinary Treatment and Diagnostic Plan Update  08/22/2023 Time of Session: 9:14AM Antonio [name] MRN: [id number]  Principal Diagnosis: MDD (major depressive disorder), recurrent episode, severe (HCC)  Secondary Diagnoses: Principal Problem:   MDD (major depressive disorder), recurrent episode, severe (HCC)   Current Medications:  Current Facility-Administered Medications  Medication Dose Route Frequency Provider Last Rate Last Admin   acetaminophen (TYLENOL) tablet 650 mg  650 mg Oral Q6H PRN Maryagnes Amos, FNP   650 mg at 08/20/23 0750   alum & mag hydroxide-simeth (MAALOX/MYLANTA) 200-200-20 MG/5ML suspension 30 mL  30 mL Oral Q4H PRN Maryagnes Amos, FNP       ARIPiprazole (ABILIFY) tablet 15 mg  15 mg Oral QHS Maryagnes Amos, FNP   15 mg at 08/21/23 2118   busPIRone (BUSPAR) tablet 10 mg  10 mg Oral BID Maryagnes Amos, FNP   10 mg at 08/22/23 5621   haloperidol (HALDOL) tablet 5 mg  5 mg Oral TID PRN Maryagnes Amos, FNP       And   diphenhydrAMINE (BENADRYL) capsule 50 mg  50 mg Oral TID PRN Starkes-Perry, Juel Burrow, FNP       magnesium hydroxide (MILK OF MAGNESIA) suspension 30 mL  30 mL Oral Daily PRN Starkes-Perry, Juel Burrow, FNP       naltrexone (DEPADE) tablet 50 mg  50 mg Oral Daily Maryagnes Amos, FNP   50 mg at 08/22/23 0906   propranolol (INDERAL) tablet 10 mg  10 mg Oral QHS Maryagnes Amos, FNP   10 mg at 08/21/23 2119   traZODone (DESYREL) tablet 50-100 mg  50-100 mg Oral QHS PRN Maryagnes Amos, FNP       venlafaxine XR (EFFEXOR-XR) 24 hr capsule 150 mg  150 mg Oral Q breakfast Maryagnes Amos, FNP   150 mg at 08/22/23 0906   PTA Medications: Medications Prior to Admission  Medication Sig Dispense Refill Last Dose   albuterol (VENTOLIN HFA) 108 (90 Base) MCG/ACT inhaler Inhale 1-2 puffs into the lungs every 4 (four) hours as needed for wheezing or shortness of breath. 18 g 1    ARIPiprazole  (ABILIFY) 15 MG tablet TAKE 1 TABLET BY MOUTH EVERYDAY AT BEDTIME 90 tablet 1    busPIRone (BUSPAR) 10 MG tablet TAKE 1 TABLET BY MOUTH TWICE A DAY 180 tablet 1    EPINEPHrine (EPIPEN 2-PAK) 0.3 mg/0.3 mL IJ SOAJ injection Inject 0.3 mg into the muscle as needed for anaphylaxis (AS DIRECTED). 2 each 1    naltrexone (DEPADE) 50 MG tablet Take 1 tablet (50 mg total) by mouth daily. 90 tablet 1    omeprazole (PRILOSEC) 40 MG capsule Take 1 capsule (40 mg total) by mouth daily before breakfast. 30 capsule 11    PATADAY 0.1 % ophthalmic solution Place 1 drop into both eyes 3 (three) times daily as needed for allergies.      propranolol (INDERAL) 10 MG tablet TAKE 1 TABLET BY MOUTH EVERYDAY AT BEDTIME 90 tablet 1    Testosterone Cypionate 200 MG/ML SOLN Inject 80 mg into the skin every Sunday. (Patient taking differently: Inject 80 mg into the skin every Wednesday.) 4 mL 1    traZODone (DESYREL) 50 MG tablet TAKE 1-2 TABLETS (50-100 MG TOTAL) BY MOUTH AT BEDTIME AS NEEDED FOR SLEEP. FOR SLEEP 180 tablet 1    venlafaxine XR (EFFEXOR-XR) 150 MG 24 hr capsule Take 1 capsule (150 mg total) by mouth daily with breakfast. 90 capsule 1  Patient Stressors: Occupational concerns   Other: sexual dysmorphia    Patient Strengths: Average or above average intelligence   Treatment Modalities: Medication Management, Group therapy, Case management,  1 to 1 session with clinician, Psychoeducation, Recreational therapy.   Physician Treatment Plan for Primary Diagnosis: MDD (major depressive disorder), recurrent episode, severe (HCC) Long Term Goal(s): Improvement in symptoms so as ready for discharge   Short Term Goals: Ability to identify and develop effective coping behaviors will improve Ability to maintain clinical measurements within normal limits will improve Compliance with prescribed medications will improve Ability to identify triggers associated with substance abuse/mental health issues will  improve Ability to verbalize feelings will improve Ability to disclose and discuss suicidal ideas Ability to demonstrate self-control will improve  Medication Management: Evaluate patient's response, side effects, and tolerance of medication regimen.  Therapeutic Interventions: 1 to 1 sessions, Unit Group sessions and Medication administration.  Evaluation of Outcomes: Not Met  Physician Treatment Plan for Secondary Diagnosis: Principal Problem:   MDD (major depressive disorder), recurrent episode, severe (HCC)  Long Term Goal(s): Improvement in symptoms so as ready for discharge   Short Term Goals: Ability to identify and develop effective coping behaviors will improve Ability to maintain clinical measurements within normal limits will improve Compliance with prescribed medications will improve Ability to identify triggers associated with substance abuse/mental health issues will improve Ability to verbalize feelings will improve Ability to disclose and discuss suicidal ideas Ability to demonstrate self-control will improve     Medication Management: Evaluate patient's response, side effects, and tolerance of medication regimen.  Therapeutic Interventions: 1 to 1 sessions, Unit Group sessions and Medication administration.  Evaluation of Outcomes: Not Met   RN Treatment Plan for Primary Diagnosis: MDD (major depressive disorder), recurrent episode, severe (HCC) Long Term Goal(s): Knowledge of disease and therapeutic regimen to maintain health will improve  Short Term Goals: Ability to demonstrate self-control, Ability to participate in decision making will improve, Ability to verbalize feelings will improve, Ability to disclose and discuss suicidal ideas, Ability to identify and develop effective coping behaviors will improve, and Compliance with prescribed medications will improve  Medication Management: RN will administer medications as ordered by provider, will assess and  evaluate patient's response and provide education to patient for prescribed medication. RN will report any adverse and/or side effects to prescribing provider.  Therapeutic Interventions: 1 on 1 counseling sessions, Psychoeducation, Medication administration, Evaluate responses to treatment, Monitor vital signs and CBGs as ordered, Perform/monitor CIWA, COWS, AIMS and Fall Risk screenings as ordered, Perform wound care treatments as ordered.  Evaluation of Outcomes: Not Met   LCSW Treatment Plan for Primary Diagnosis: MDD (major depressive disorder), recurrent episode, severe (HCC) Long Term Goal(s): Safe transition to appropriate next level of care at discharge, Engage patient in therapeutic group addressing interpersonal concerns.  Short Term Goals: Engage patient in aftercare planning with referrals and resources, Increase social support, Increase ability to appropriately verbalize feelings, Increase emotional regulation, Facilitate acceptance of mental health diagnosis and concerns, and Increase skills for wellness and recovery  Therapeutic Interventions: Assess for all discharge needs, 1 to 1 time with Social worker, Explore available resources and support systems, Assess for adequacy in community support network, Educate family and significant other(s) on suicide prevention, Complete Psychosocial Assessment, Interpersonal group therapy.  Evaluation of Outcomes: Not Met   Progress in Treatment: Attending groups: Yes. Participating in groups: Yes. Taking medication as prescribed: Yes. Toleration medication: Yes. Family/Significant other contact made: No, will contact:  once  permission has been granted Patient understands diagnosis: Yes. Discussing patient identified problems/goals with staff: Yes. Medical problems stabilized or resolved: Yes. Denies suicidal/homicidal ideation: Yes. Issues/concerns per patient self-inventory: No. Other: none  New problem(s) identified: No, Describe:   none  New Short Term/Long Term Goal(s): detox, elimination of symptoms of psychosis, medication management for mood stabilization; elimination of SI thoughts; development of comprehensive mental wellness/sobriety plan.   Patient Goals:  "get back into a routine"  Discharge Plan or Barriers: CSW to assist in the development of appropriate discharge plans.    Reason for Continuation of Hospitalization: Anxiety Depression Medication stabilization Suicidal ideation  Estimated Length of Stay:  1-7 days  Last 3 [place] Suicide Severity Risk Score: Flowsheet Row Admission (Current) from 08/19/2023 in [facility]INE Most recent reading at 08/20/2023  5:34 AM ED from 08/19/2023 in [facility] Emergency Department at [facility] Most recent reading at 08/19/2023  4:27 AM [facility] Most recent reading at 02/09/2023 10:56 PM  C-SSRS RISK CATEGORY High Risk Error: Question 6 not populated High Risk       Last PHQ 2/9 Scores:    05/25/2023    9:27 AM 08/18/2022   11:30 AM 05/28/2019   11:30 AM  Depression screen PHQ 2/9  Decreased Interest 1 1 1   Down, Depressed, Hopeless 0 1 2  PHQ - 2 Score 1 2 3   Altered sleeping 3 3 3   Tired, decreased energy 3 3 3   Change in appetite 1 1 2   Feeling bad or failure about yourself  1 1 2   Trouble concentrating 1 0 1  Moving slowly or fidgety/restless 0 0 1  Suicidal thoughts 1 1   PHQ-9 Score 11 11 15   Difficult doing work/chores Somewhat difficult Very difficult     Scribe for Treatment Team: Harden Mo, LCSW 08/22/2023 10:28 AM

## 2023-08-22 NOTE — Plan of Care (Signed)
  Problem: Education: Goal: Knowledge of [facility] Education information/materials will improve Outcome: Progressing Goal: Emotional status will improve Outcome: Progressing Goal: Mental status will improve Outcome: Progressing Goal: Verbalization of understanding the information provided will improve Outcome: Progressing   

## 2023-08-22 NOTE — Group Note (Signed)
Recreation Therapy Group Note   Group Topic:Problem Solving  Group Date: 08/22/2023 Start Time: 1000 End Time: 1100 Facilitators: Rosina Lowenstein, LRT, CTRS Location:  Craft Room  Group Description: Life Boat. Patients were given the scenario that they are on a boat that is about to become shipwrecked, leaving them stranded on an [place]. They are asked to make a list of 15 different items that they want to take with them when they are stranded on the [place]. Patients are asked to rank their items from most important to least important, #1 being the most important and #15 being the least. Patients will work individually for the first round to come up with 15 items and then pair up with a peer(s) to condense their list and come up with one list of 15 items between the two of them. Patients or LRT will read aloud the 15 different items to the group after each round. LRT facilitated post-activity processing to discuss how this activity can be used in daily life post discharge.   Goal Area(s) Addressed:  Patient will identify priorities, wants and needs. Patient will communicate with LRT and peers. Patient will work collectively as a Administrator, Civil Service. Patient will work on Product manager.    Affect/Mood: Appropriate   Participation Level: Active and Engaged   Participation Quality: Independent   Behavior: Appropriate, Calm, and Cooperative   Speech/Thought Process: Coherent   Insight: Good   Judgement: Good   Modes of Intervention: Group work, Dentist, and Team-building   Patient Response to Interventions:  Attentive, Engaged, and Receptive   Education Outcome:  Acknowledges education   Clinical Observations/Individualized Feedback: Antonio [name] was active in their participation of session activities and group discussion. Pt identified "food, pen, paper, books, rope, weighted stuffed animal, my dogs" as things he will bring with him on the [name]. Pt spontaneously contributed to  group discussion while interacting well with LRT and peers duration of session.    Plan: Continue to engage patient in RT group sessions 2-3x/week.   Rosina Lowenstein, LRT, CTRS 08/22/2023 12:54 PM

## 2023-08-22 NOTE — Plan of Care (Signed)
  Problem: Education: Goal: Knowledge of [facility] Education information/materials will improve Outcome: Progressing Goal: Emotional status will improve Outcome: Progressing Goal: Mental status will improve Outcome: Progressing Goal: Verbalization of understanding the information provided will improve Outcome: Progressing   Problem: Activity: Goal: Interest or engagement in activities will improve Outcome: Progressing Goal: Sleeping patterns will improve Outcome: Progressing   Problem: Coping: Goal: Ability to verbalize frustrations and anger appropriately will improve Outcome: Progressing Goal: Ability to demonstrate self-control will improve Outcome: Progressing   Problem: Health Behavior/Discharge Planning: Goal: Identification of resources available to assist in meeting health care needs will improve Outcome: Progressing Goal: Compliance with treatment plan for underlying cause of condition will improve Outcome: Progressing   Problem: Physical Regulation: Goal: Ability to maintain clinical measurements within normal limits will improve Outcome: Progressing   Problem: Safety: Goal: Periods of time without injury will increase Outcome: Progressing   Problem: Education: Goal: Utilization of techniques to improve thought processes will improve Outcome: Progressing Goal: Knowledge of the prescribed therapeutic regimen will improve Outcome: Progressing   Problem: Activity: Goal: Interest or engagement in leisure activities will improve Outcome: Progressing Goal: Imbalance in normal sleep/wake cycle will improve Outcome: Progressing   Problem: Coping: Goal: Coping ability will improve Outcome: Progressing Goal: Will verbalize feelings Outcome: Progressing   Problem: Health Behavior/Discharge Planning: Goal: Ability to make decisions will improve Outcome: Progressing Goal: Compliance with therapeutic regimen will improve Outcome: Progressing    Problem: Role Relationship: Goal: Will demonstrate positive changes in social behaviors and relationships Outcome: Progressing   Problem: Safety: Goal: Ability to disclose and discuss suicidal ideas will improve Outcome: Progressing Goal: Ability to identify and utilize support systems that promote safety will improve Outcome: Progressing   Problem: Self-Concept: Goal: Will verbalize positive feelings about self Outcome: Progressing Goal: Level of anxiety will decrease Outcome: Progressing   

## 2023-08-22 NOTE — Progress Notes (Signed)
Pt calm and pleasant during assessment denying SI/HI/AVH. Pt observed interacting appropriately with staff and peers on the unit. Pt compliant with medication administration per MD orders. Pt given education, support, and encouragement to be active in his treatment plan. Pt being monitored Q 15 minutes for safety per unit protocol, remains safe on the unit. 

## 2023-08-22 NOTE — Group Note (Signed)
Date:  08/22/2023 Time:  9:42 PM  Group Topic/Focus:  Orientation:   The focus of this group is to educate the patient on the purpose and policies of crisis stabilization and provide a format to answer questions about their admission.  The group details unit policies and expectations of patients while admitted.    Participation Level:  Active  Participation Quality:  Appropriate and Attentive  Affect:  Appropriate  Cognitive:  Alert and Appropriate  Insight: Appropriate  Engagement in Group:  Engaged and Improving  Modes of Intervention:  Clarification, Discussion, Education, Orientation, Rapport Building, and Support  Additional Comments:     Kalifa Cadden 08/22/2023, 9:42 PM

## 2023-08-22 NOTE — Group Note (Signed)
Date:  08/22/2023 Time:  5:04 PM  Group Topic/Focus:  Activity Group: The focus of the group is to encourage patients to go outside in the courtyard and get some fresh ar and some exercise.    Participation Level:  Active  Participation Quality:  Appropriate  Affect:  Appropriate  Cognitive:  Appropriate  Insight: Appropriate  Engagement in Group:  Engaged  Modes of Intervention:  Activity  Additional Comments:    Antonio [name] 08/22/2023, 5:04 PM

## 2023-08-22 NOTE — Progress Notes (Signed)
Assumed care of patient this pm, he present A&O x 4, affect & mood seems sad and depressed. Pt out in the milieu pacing and seems preoccupied at times. Minimal socialization noted behavior appropriate to situation and was without complaints. " I just want to get back to a routine". Pt denied thoughts, plan or intent to harm self or others and made a safety commitment,  Pt is compliant with taking medications and treatment plan. Pt educated on plan of care, medication regimen and he acknowledged an understanding and was without further complaints or concerns. Pt is been maintained on q 15 min rounds for safety and support.

## 2023-08-22 NOTE — Group Note (Signed)
[facility] LCSW Group Therapy Note    Group Date: 08/22/2023 Start Time: 1300 End Time: 1400  Type of Therapy and Topic:  Group Therapy:  Overcoming Obstacles  Participation Level:  [facility] PARTICIPATION LEVEL: Did Not Attend   Description of Group:   In this group patients will be encouraged to explore what they see as obstacles to their own wellness and recovery. They will be guided to discuss their thoughts, feelings, and behaviors related to these obstacles. The group will process together ways to cope with barriers, with attention given to specific choices patients can make. Each patient will be challenged to identify changes they are motivated to make in order to overcome their obstacles. This group will be process-oriented, with patients participating in exploration of their own experiences as well as giving and receiving support and challenge from other group members.  Therapeutic Goals: 1. Patient will identify personal and current obstacles as they relate to admission. 2. Patient will identify barriers that currently interfere with their wellness or overcoming obstacles.  3. Patient will identify feelings, thought process and behaviors related to these barriers. 4. Patient will identify two changes they are willing to make to overcome these obstacles:    Summary of Patient Progress X   Therapeutic Modalities:   Cognitive Behavioral Therapy Solution Focused Therapy Motivational Interviewing Relapse Prevention Therapy   Glenis Smoker, LCSW

## 2023-08-22 NOTE — Progress Notes (Signed)
   08/22/23 0200  Psych Admission Type (Psych Patients Only)  Admission Status Involuntary  Psychosocial Assessment  Patient Complaints Depression  Eye Contact Fair  Facial Expression Sad  Affect Sad  Speech Soft  Interaction Assertive  Motor Activity Slow  Appearance/Hygiene Unremarkable  Behavior Characteristics Cooperative;Appropriate to situation  Mood Depressed  Thought Process  Coherency WDL  Content Blaming others  Delusions None reported or observed  Perception WDL  Hallucination None reported or observed  Judgment Impaired  Confusion None  Danger to Self  Current suicidal ideation? Denies (Denies)  Agreement Not to Harm Self Yes  Description of Agreement verbal  Danger to Others  Danger to Others None reported or observed

## 2023-08-22 NOTE — Group Note (Signed)
Date:  08/22/2023 Time:  10:21 AM  Group Topic/Focus:  Goals Group:   The focus of this group is to help patients establish daily goals to achieve during treatment and discuss how the patient can incorporate goal setting into their daily lives to aide in recovery.    Participation Level:  Active  Participation Quality:  Appropriate  Affect:  Appropriate  Cognitive:  Appropriate  Insight: Appropriate  Engagement in Group:  Engaged  Modes of Intervention:  Activity  Additional Comments:    Antonio [name] 08/22/2023, 10:21 AM

## 2023-08-23 DIAGNOSIS — F332 Major depressive disorder, recurrent severe without psychotic features: Secondary | ICD-10-CM | POA: Diagnosis not present

## 2023-08-23 MED ORDER — PANTOPRAZOLE SODIUM 40 MG PO TBEC
40.0000 mg | DELAYED_RELEASE_TABLET | Freq: Every day | ORAL | Status: DC
  Administered 2023-08-23 – 2023-08-24 (×2): 40 mg via ORAL
  Filled 2023-08-23 (×2): qty 1

## 2023-08-23 MED ORDER — TESTOSTERONE CYPIONATE 200 MG/ML IM SOLN
80.0000 mg | Freq: Once | INTRAMUSCULAR | Status: DC
  Filled 2023-08-23: qty 0.4

## 2023-08-23 NOTE — Plan of Care (Signed)
 Pt denies SI/HI/AVH, compliant with procedures on the unit  Problem: Education: Goal: Knowledge of [facility] Education information/materials will improve Outcome: Progressing Goal: Emotional status will improve Outcome: Progressing Goal: Mental status will improve Outcome: Progressing Goal: Verbalization of understanding the information provided will improve Outcome: Progressing   Problem: Activity: Goal: Interest or engagement in activities will improve Outcome: Progressing Goal: Sleeping patterns will improve Outcome: Progressing   Problem: Coping: Goal: Ability to verbalize frustrations and anger appropriately will improve Outcome: Progressing Goal: Ability to demonstrate self-control will improve Outcome: Progressing   Problem: Health Behavior/Discharge Planning: Goal: Identification of resources available to assist in meeting health care needs will improve Outcome: Progressing Goal: Compliance with treatment plan for underlying cause of condition will improve Outcome: Progressing   Problem: Physical Regulation: Goal: Ability to maintain clinical measurements within normal limits will improve Outcome: Progressing   Problem: Safety: Goal: Periods of time without injury will increase Outcome: Progressing   Problem: Education: Goal: Utilization of techniques to improve thought processes will improve Outcome: Progressing Goal: Knowledge of the prescribed therapeutic regimen will improve Outcome: Progressing   Problem: Activity: Goal: Interest or engagement in leisure activities will improve Outcome: Progressing Goal: Imbalance in normal sleep/wake cycle will improve Outcome: Progressing   Problem: Coping: Goal: Coping ability will improve Outcome: Progressing Goal: Will verbalize feelings Outcome: Progressing   Problem: Health Behavior/Discharge Planning: Goal: Ability to make decisions will improve Outcome: Progressing Goal: Compliance with  therapeutic regimen will improve Outcome: Progressing   Problem: Role Relationship: Goal: Will demonstrate positive changes in social behaviors and relationships Outcome: Progressing   Problem: Safety: Goal: Ability to disclose and discuss suicidal ideas will improve Outcome: Progressing Goal: Ability to identify and utilize support systems that promote safety will improve Outcome: Progressing   Problem: Self-Concept: Goal: Will verbalize positive feelings about self Outcome: Progressing Goal: Level of anxiety will decrease Outcome: Progressing

## 2023-08-23 NOTE — Plan of Care (Signed)
  Problem: Education: Goal: Knowledge of [facility] Education information/materials will improve Outcome: Progressing Goal: Emotional status will improve Outcome: Progressing Goal: Mental status will improve Outcome: Progressing Goal: Verbalization of understanding the information provided will improve Outcome: Progressing   Problem: Activity: Goal: Interest or engagement in activities will improve Outcome: Progressing Goal: Sleeping patterns will improve Outcome: Progressing   Problem: Coping: Goal: Ability to verbalize frustrations and anger appropriately will improve Outcome: Progressing Goal: Ability to demonstrate self-control will improve Outcome: Progressing   Problem: Health Behavior/Discharge Planning: Goal: Identification of resources available to assist in meeting health care needs will improve Outcome: Progressing Goal: Compliance with treatment plan for underlying cause of condition will improve Outcome: Progressing

## 2023-08-23 NOTE — Progress Notes (Signed)
Pt calm and pleasant during assessment denying SI/HI/AVH. Pt observed interacting appropriately with staff and peers on the unit. Pt compliant with medication administration per MD orders. Pt given education, support, and encouragement to be active in his treatment plan. Pt being monitored Q 15 minutes for safety per unit protocol, remains safe on the uni

## 2023-08-23 NOTE — Group Note (Signed)
Recreation Therapy Group Note   Group Topic:Healthy Support Systems  Group Date: 08/23/2023 Start Time: 1000 End Time: 1100 Facilitators: Rosina Lowenstein, LRT, CTRS Location:  Craft Room  Group Description: Straw Bridge. In groups or individually, patients were given 10 plastic drinking straws and an equal length of masking tape. Using the materials provided, patients were instructed to build a free-standing bridge-like structure to suspend an everyday item (ex: deck of cards) off the floor or table surface. All materials were required to be used in Secondary school teacher. LRT facilitated post-activity discussion reviewing the importance of having strong and healthy support systems in our lives. LRT discussed how the people in our lives serve as the tape and the deck of cards we placed on top of our straw structure are the stressors we face in daily life. LRT and pts discussed what happens in our life when things get too heavy for [place], and we don't have strong supports outside of the hospital. Pt shared 2 of their healthy supports in their life aloud in the group.   Goal Area(s) Addressed:  Patient will identify 2 healthy supports in their life. Patient will identify skills to successfully complete activity. Patient will identify correlation of this activity to life post-discharge.  Patient will build on frustration tolerance skills. Patient will increase team building and communication skills.    Affect/Mood: Appropriate   Participation Level: Active and Engaged   Participation Quality: Independent   Behavior: Appropriate and Cooperative   Speech/Thought Process: Coherent   Insight: Good   Judgement: Good   Modes of Intervention: Education, Group work, Guided Discussion, Dentist, and Team-building   Patient Response to Interventions:  Attentive, Engaged, Interested , and Receptive   Education Outcome:  Acknowledges education   Clinical Observations/Individualized Feedback:  Antonio [name] was active in their participation of session activities and group discussion. Pt identified "my mom and therapist" as his healthy supports. Pt spontaneously contributed to group discussion while interacting well with LRT and peers duration of session.    Plan: Continue to engage patient in RT group sessions 2-3x/week.   Rosina Lowenstein, LRT, CTRS 08/23/2023 1:09 PM

## 2023-08-23 NOTE — Group Note (Signed)
Date:  08/23/2023 Time:  3:41 PM  Group Topic/Focus:  Overcoming Stress:   The focus of this group is to define stress and help patients assess their triggers.    Participation Level:  Active  Participation Quality:  Appropriate  Affect:  Appropriate  Cognitive:  Appropriate  Insight: Appropriate  Engagement in Group:  Engaged  Modes of Intervention:  Activity  Additional Comments:    Antonio [name] Antonio [name] 08/23/2023, 3:41 PM

## 2023-08-23 NOTE — Progress Notes (Signed)
Assumed care of patient this pm, he/she present A&O x 4, affect flat mood more animated and she time out in the milieu and more sociable. Pt was without complaints and stated she can't "wait to be discharge and get back on point".. Pt denied thoughts, plan or intent to harm self or others and made a safety commitment, is/iPt is compliant with taking medications and follow plan of care plan , Pt educated on plan of care, medication regimen and she/he acknowledged an understanding and was without further complaints or concerns. He is been maintained on q 15 min rounds.

## 2023-08-23 NOTE — Progress Notes (Signed)
[facility] MD Progress Note  08/23/2023 2:22 PM Antonio [name]  MRN:  [id number] Subjective:  17 year old African American transgender male currently undergoing hormone replacement therapy (HRT). He has expressed a desire to go home and denies any suicidal ideation (SI), homicidal ideation (HI), or auditory/visual hallucinations (AVH). The patient is cooperative and articulate in expressing his request but appears slightly anxious about the decision process. He continues to affirm his commitment to HRT. 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   Asthma    Bupropion overdose 01/16/2020   Complication of anesthesia    gets disoriented and shakes after surgery   Deliberate self-cutting 10/17/2018   Depression    Eczema    Fracture of 5th metatarsal    Gender dysphoria    Intentional overdose of drug in tablet form (HCC) 04/03/2020   MDD (major depressive disorder), recurrent severe, without psychosis (HCC) 11/02/2021   Moderate episode of recurrent major depressive disorder (HCC) 04/24/2020   Multiple allergies    Severe episode of recurrent major depressive disorder, without psychotic features (HCC) 01/02/2020   Suicidal ideations 08/31/2022   Suicide attempt by cutting of wrist (HCC) 07/28/2022    Past Surgical History:  Procedure Laterality Date   SUPPRELIN IMPLANT Left 01/22/2019   Procedure: SUPPRELIN IMPLANT;  Surgeon: Kandice Hams, MD;  Location: [facility];  Service: Pediatrics;  Laterality: Left;   SUPPRELIN REMOVAL N/A 03/03/2020   Procedure: SUPPRELIN REMOVAL;  Surgeon: Kandice Hams, MD;   Location: [facility];  Service: Pediatrics;  Laterality: N/A;   TONSILLECTOMY     TYMPANOSTOMY TUBE PLACEMENT     Family History:  Family History  Problem Relation Age of Onset   Depression Mother    Anxiety disorder Mother    Hypertension Maternal Grandmother    Allergic rhinitis Neg Hx    Angioedema Neg Hx    Atopy Neg Hx    Eczema Neg Hx    Immunodeficiency Neg Hx    Urticaria Neg Hx    Family Psychiatric  History: see above Social History:  Social History   Substance and Sexual Activity  Alcohol Use No     Social History   Substance and Sexual Activity  Drug Use Not Currently   Types: Marijuana    Social History   Socioeconomic History   Marital status: Single    Spouse name: Not on file   Number of children: Not on file   Years of education: Not on file   Highest education level: 12th grade  Occupational History   Not on file  Tobacco Use   Smoking status: Never   Smokeless tobacco: Never  Vaping Use   Vaping status: Former   Substances: Nicotine, Flavoring   Devices: Nicotine  Substance and Sexual Activity   Alcohol use: No   Drug use: Not Currently    Types: Marijuana   Sexual activity: Not Currently  Other Topics Concern   Not on file  Social History Narrative   Lives with mom and stepdad.      He is in 12th grade at [employer] 23-24 school year      He enjoys writing  and listen to music, writes poetry   Social Determinants of Health   Financial Resource Strain: Low Risk  (05/24/2023)   Overall Financial Resource Strain (CARDIA)    Difficulty of Paying Living Expenses: Not hard at all  Food Insecurity: No Food Insecurity (08/20/2023)   Hunger Vital Sign    Worried About Running Out of Food in the Last Year: Never true    Ran Out of Food in the Last Year: Never true  Transportation Needs: No Transportation Needs (08/20/2023)   PRAPARE - Administrator, Civil Service (Medical): No    Lack of Transportation  (Non-Medical): No  Recent Concern: Transportation Needs - Unmet Transportation Needs (05/24/2023)   PRAPARE - Transportation    Lack of Transportation (Medical): Yes    Lack of Transportation (Non-Medical): Yes  Physical Activity: Unknown (05/24/2023)   Exercise Vital Sign    Days of Exercise per Week: 0 days    Minutes of Exercise per Session: Not on file  Stress: Stress Concern Present (05/24/2023)   [employer] of Occupational Health - Occupational Stress Questionnaire    Feeling of Stress : To some extent  Social Connections: Unknown (06/24/2023)   Received from [facility]   Social Network    Social Network: Not on file   Additional Social History:                         Sleep: Good  Appetite:  Good  Current Medications: Current Facility-Administered Medications  Medication Dose Route Frequency Provider Last Rate Last Admin   acetaminophen (TYLENOL) tablet 650 mg  650 mg Oral Q6H PRN Maryagnes Amos, FNP   650 mg at 08/20/23 0750   alum & mag hydroxide-simeth (MAALOX/MYLANTA) 200-200-20 MG/5ML suspension 30 mL  30 mL Oral Q4H PRN Maryagnes Amos, FNP       ARIPiprazole (ABILIFY) tablet 15 mg  15 mg Oral QHS Maryagnes Amos, FNP   15 mg at 08/22/23 2131   busPIRone (BUSPAR) tablet 10 mg  10 mg Oral BID Maryagnes Amos, FNP   10 mg at 08/23/23 1710   haloperidol (HALDOL) tablet 5 mg  5 mg Oral TID PRN Maryagnes Amos, FNP       And   diphenhydrAMINE (BENADRYL) capsule 50 mg  50 mg Oral TID PRN Maryagnes Amos, FNP       magnesium hydroxide (MILK OF MAGNESIA) suspension 30 mL  30 mL Oral Daily PRN Maryagnes Amos, FNP       naltrexone (DEPADE) tablet 50 mg  50 mg Oral Daily Maryagnes Amos, FNP   50 mg at 08/23/23 0800   pantoprazole (PROTONIX) EC tablet 40 mg  40 mg Oral Daily Myriam Forehand, NP       propranolol (INDERAL) tablet 10 mg  10 mg Oral QHS Maryagnes Amos, FNP   10 mg at 08/22/23 2131    testosterone cypionate (DEPOTESTOSTERONE CYPIONATE) injection 80 mg  80 mg Intramuscular Once Myriam Forehand, NP       traZODone (DESYREL) tablet 50-100 mg  50-100 mg Oral QHS PRN Maryagnes Amos, FNP   50 mg at 08/22/23 2131   venlafaxine XR (EFFEXOR-XR) 24 hr capsule 150 mg  150 mg Oral Q breakfast Maryagnes Amos, FNP   150 mg at 08/23/23 7829    Lab Results: No results found for this or any previous visit (from the past 48 hour(s)).  Blood Alcohol  level:  Lab Results  Component Value Date   ETH <10 08/19/2023   ETH <10 02/09/2023    Metabolic Disorder Labs: Lab Results  Component Value Date   HGBA1C 5.1 12/30/2022   MPG 99.67 12/30/2022   MPG 105 09/01/2022   Lab Results  Component Value Date   PROLACTIN 10.0 09/01/2022   PROLACTIN 10.3 04/16/2022   Lab Results  Component Value Date   CHOL 200 (H) 12/30/2022   TRIG 114 12/30/2022   HDL 40 (L) 12/30/2022   CHOLHDL 5.0 12/30/2022   VLDL 23 12/30/2022   LDLCALC 137 (H) 12/30/2022   LDLCALC 170 (H) 09/01/2022    Physical Findings: AIMS:  , ,  ,  ,    CIWA:    COWS:     Musculoskeletal: Strength & Muscle Tone: within normal limits Gait & Station: normal Patient leans: N/A  Psychiatric Specialty Exam:  Presentation  General Appearance:  Casual  Eye Contact: Fair  Speech: Clear and Coherent  Speech Volume: Normal  Handedness: Right   Mood and Affect  Mood: Dysphoric  Affect: Blunt; Restricted   Thought Process  Thought Processes: Coherent  Descriptions of Associations:Intact  Orientation:Full (Time, Place and Person)  Thought Content:Rumination  History of Schizophrenia/Schizoaffective disorder:No data recorded Duration of Psychotic Symptoms:No data recorded Hallucinations:No data recorded Ideas of Reference:None  Suicidal Thoughts:No data recorded Homicidal Thoughts:No data recorded  Sensorium  Memory: Immediate Fair; Recent  Fair  Judgment: Fair  Insight: Fair   Art therapist  Concentration: Fair  Attention Span: Fair  Recall: [employer] of Knowledge: Fair  Language: Fair   Psychomotor Activity  Psychomotor Activity:No data recorded  Assets  Assets: Communication Skills; Desire for Improvement; Housing   Sleep  Sleep:No data recorded   Physical Exam: Physical Exam Vitals and nursing note reviewed.  Constitutional:      Appearance: Normal appearance.  HENT:     Head: Normocephalic and atraumatic.     Nose: Nose normal.  Pulmonary:     Effort: Pulmonary effort is normal.  Musculoskeletal:        General: Normal range of motion.     Cervical back: Normal range of motion.  Neurological:     General: No focal deficit present.     Mental Status: He is alert and oriented to person, place, and time. Mental status is at baseline.  Psychiatric:        Attention and Perception: Attention and perception normal.        Mood and Affect: Mood is anxious. Affect is flat.        Speech: Speech normal.        Behavior: Behavior normal. Behavior is cooperative.        Thought Content: Thought content normal.        Cognition and Memory: Cognition and memory normal.        Judgment: Judgment normal.    Review of Systems  Skin:        Left arm with sutures intact dry  Psychiatric/Behavioral:  The patient is nervous/anxious.   All other systems reviewed and are negative.  Blood pressure 128/83, pulse 95, temperature 98.8 F (37.1 C), resp. rate 19, SpO2 98%. There is no height or weight on file to calculate BMI.   Treatment Plan Summary: Daily contact with patient to assess and evaluate symptoms and progress in treatment and Medication management Order Testosterone Cypionate 200 mg IM (80 mg dose) as scheduled to maintain HRT continuity. Aripiprazole (Abilify) 15  mg Oral at bedtime manage mood disorders, including emotional dysregulation or psychotic symptoms. Buspirone  (Buspar) 10 mg Oral BID generalized anxiety disorder by reducing excessive worry and tension. Pantoprazole (Protonix) 40 mg Oral Daily  used to treat GERD or prevent gastric ulcers. Propranolol (Inderal) 10 mg Oral at bedtime xiety, such as tachycardia, and may prevent nightmares. Myriam Forehand, NP 08/23/2023, 7:24 PM

## 2023-08-23 NOTE — Group Note (Signed)
[facility] LCSW Group Therapy Note   Group Date: 08/23/2023 Start Time: 1300 End Time: 1400  Type of Therapy/Topic:  Group Therapy:  Feelings about Diagnosis  Participation Level:  Active   Description of Group:    This group will allow patients to explore their thoughts and feelings about diagnoses they have received. Patients will be guided to explore their level of understanding and acceptance of these diagnoses. Facilitator will encourage patients to process their thoughts and feelings about the reactions of others to their diagnosis, and will guide patients in identifying ways to discuss their diagnosis with significant others in their lives. This group will be process-oriented, with patients participating in exploration of their own experiences as well as giving and receiving support and challenge from other group members.   Therapeutic Goals: 1. Patient will demonstrate understanding of diagnosis as evidence by identifying two or more symptoms of the disorder:  2. Patient will be able to express two feelings regarding the diagnosis 3. Patient will demonstrate ability to communicate their needs through discussion and/or role plays  Summary of Patient Progress: Patient was present in group.  Patient was an active participant.  Patient was engaged and supportive of other group members.  Patient was insightful and appropriate.  Patient discussed the impact of a mental health diagnosis and how that can alter someone life.  She engaged in discussion on coping skills.    Therapeutic Modalities:   Cognitive Behavioral Therapy Brief Therapy Feelings Identification    Harden Mo, LCSW

## 2023-08-23 NOTE — Group Note (Signed)
Date:  08/23/2023 Time:  9:05 PM  Group Topic/Focus:  Recovery Goals:   The focus of this group is to identify appropriate goals for recovery and establish a plan to achieve them. Wrap-Up Group:   The focus of this group is to help patients review their daily goal of treatment and discuss progress on daily workbooks.    Participation Level:  Did Not Attend   Antonio [name] 08/23/2023, 9:05 PM

## 2023-08-23 NOTE — Group Note (Signed)
Date:  08/23/2023 Time:  10:08 AM  Group Topic/Focus:  Goals Group:   The focus of this group is to help patients establish daily goals to achieve during treatment and discuss how the patient can incorporate goal setting into their daily lives to aide in recovery.    Participation Level:  Active  Participation Quality:  Appropriate  Affect:  Appropriate  Cognitive:  Appropriate  Insight: Appropriate  Engagement in Group:  Engaged  Modes of Intervention:  Discussion, Education, and Support  Additional Comments:    Wilford Corner 08/23/2023, 10:08 AM

## 2023-08-24 DIAGNOSIS — F332 Major depressive disorder, recurrent severe without psychotic features: Secondary | ICD-10-CM | POA: Diagnosis not present

## 2023-08-24 MED ORDER — PROPRANOLOL HCL 10 MG PO TABS: 10.0000 mg | ORAL_TABLET | Freq: Every day | ORAL | 0 refills | Status: DC

## 2023-08-24 MED ORDER — PANTOPRAZOLE SODIUM 40 MG PO TBEC: 40.0000 mg | DELAYED_RELEASE_TABLET | Freq: Every day | ORAL | 0 refills | Status: DC

## 2023-08-24 MED ORDER — ARIPIPRAZOLE 15 MG PO TABS: 15.0000 mg | ORAL_TABLET | Freq: Every day | ORAL | 0 refills | Status: DC

## 2023-08-24 MED ORDER — TRAZODONE HCL 50 MG PO TABS: 50.0000 mg | ORAL_TABLET | Freq: Every evening | ORAL | 0 refills | Status: DC | PRN

## 2023-08-24 NOTE — Group Note (Signed)
Recreation Therapy Group Note   Group Topic:Relaxation  Group Date: 08/24/2023 Start Time: 1000 End Time: 1050 Facilitators: Rosina Lowenstein, LRT, CTRS Location:  Craft Room  Group Description: PMR (Progressive Muscle Relaxation). LRT asks patients their current level of stress/anxiety from 1-10, with 10 being the highest. LRT educates patients on what PMR is and the benefits that come from it. Patients are asked to sit with their feet flat on the floor while sitting up and all the way back in their chair, if possible. LRT and pts follow a prompt through a speaker that requires you to tense and release different muscles in their body and focus on their breathing. During session, lights are off and soft music is being played. Pts are given a stress ball to use if needed. At the end of the prompt, LRT asks patients to rank their current levels of stress/anxiety from 1-10, 10 being the highest. LRT provides patients with an education handout on PMR.   Goal Area(s) Addressed:  Patients will be able to describe progressive muscle relaxation.  Patient will practice using relaxation technique. Patient will identify a new coping skill.  Patient will follow multistep directions to reduce anxiety and stress.   Affect/Mood: Appropriate   Participation Level: Active and Engaged   Participation Quality: Independent   Behavior: Appropriate, Calm, and Cooperative   Speech/Thought Process: Coherent   Insight: Good   Judgement: Good   Modes of Intervention: Activity, Education, and Exploration   Patient Response to Interventions:  Attentive, Engaged, Interested , and Requested additional information/resources    Education Outcome:  Acknowledges education   Clinical Observations/Individualized Feedback: V[place]on was active in their participation of session activities and group discussion. Pt identified that his anxiety was a 0 and stress was a 2 before the session. Pt shared that after, his  anxiety and stress were both a 0. Pt shared that he is familiar with PMR and has done it in the past. Pt wrote: "very relaxing" in the comment section of the handout. Pt received a stress ball after session. Pt interacted well with LRT and peers duration of session.    Plan: Continue to engage patient in RT group sessions 2-3x/week.   Rosina Lowenstein, LRT, CTRS 08/24/2023 12:58 PM

## 2023-08-24 NOTE — Progress Notes (Signed)
  [facility] Adult Case Management Discharge Plan :  Will you be returning to the same living situation after discharge:  Yes,  Patient to return home.  At discharge, do you have transportation home?: Yes,  Patient's mother to provide transportation.  Do you have the ability to pay for your medications: [facility]   Release of information consent forms completed and in the chart;  Patient's signature needed at discharge.  Patient to Follow up at:  Follow-up Information     [facility] Follow up.   Why: Cornell Barman appointment at 12/12 at 5 PM. Contact information: [address] [place] [address] [phone]         [facility]. Go to.   Why: Medication Management appointment with Dr. Jerold Coombe 09/26/23 at 10 AM.                Next level of care provider has access to [facility] Link:no  Safety Planning and Suicide Prevention discussed: Yes,  SPE conducted with patient's mother with patient's consent. SPE material given at discharge.      Has patient been referred to the Quitline?: Patient does not use tobacco/nicotine products  Patient has been referred for addiction treatment: No known substance use disorder.  Lowry Ram, LCSW 08/24/2023, 9:56 AM

## 2023-08-24 NOTE — Discharge Summary (Addendum)
Physician Discharge Summary Note  Patient:  Antonio [name] is an 17 y.o., adult MRN:  [id number] DOB:  2006/07/15 Patient phone:  [phone] (home)  Patient address:   [address] [place] [facility] [address],  Total Time spent with patient: 1.5 hours  Date of Admission:  08/19/2023 Date of Discharge: 08/24/2023  Reason for Admission:  17 year old African American transgender male, was admitted voluntarily to the inpatient psychiatric unit following a self-inflicted laceration on his left forearm that required 14 sutures. The patient reported feeling suicidal at the time of the incident and expressed emotional distress related to challenges in accessing hormone replacement therapy (HRT), stating, "I haven't seen my endocrinologist since January, I will find another one."He has a history of self-mutilation, recurrent major depressive disorder, gender dysphoria, and multiple prior psychiatric hospitalizations. At the time of admission, the patient denied specific triggers but described experiencing a "rough month" with fluctuating suicidal ideation. Admission was pursued for safety, stabilization, and evaluation of his mental health needs, including addressing barriers to consistent HRT and managing recurrent self-harm behavior  Principal Problem: MDD (major depressive disorder), recurrent episode, severe (HCC) Discharge Diagnoses: Principal Problem:   MDD (major depressive disorder), recurrent episode, severe (HCC) Active Problems:   Deliberate self-cutting   Suicide attempt by cutting of wrist [facility])   Past Psychiatric History: Gender Dysphoria Anxiety  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   Asthma    Bupropion overdose 01/16/2020   Complication of anesthesia    gets disoriented and shakes after surgery   Deliberate self-cutting 10/17/2018   Depression    Eczema     Fracture of 5th metatarsal    Gender dysphoria    Intentional overdose of drug in tablet form (HCC) 04/03/2020   MDD (major depressive disorder), recurrent severe, without psychosis (HCC) 11/02/2021   Moderate episode of recurrent major depressive disorder (HCC) 04/24/2020   Multiple allergies    Severe episode of recurrent major depressive disorder, without psychotic features (HCC) 01/02/2020   Suicidal ideations 08/31/2022   Suicide attempt by cutting of wrist (HCC) 07/28/2022    Past Surgical History:  Procedure Laterality Date   SUPPRELIN IMPLANT Left 01/22/2019   Procedure: SUPPRELIN IMPLANT;  Surgeon: Kandice Hams, MD;  Location: [facility];  Service: Pediatrics;  Laterality: Left;   SUPPRELIN REMOVAL N/A 03/03/2020   Procedure: SUPPRELIN REMOVAL;  Surgeon: Kandice Hams, MD;  Location:  SURGERY CENTER;  Service: Pediatrics;  Laterality: N/A;   TONSILLECTOMY     TYMPANOSTOMY TUBE PLACEMENT     Family History:  Family History  Problem Relation Age of Onset   Depression Mother    Anxiety disorder Mother    Hypertension Maternal Grandmother    Allergic rhinitis Neg Hx    Angioedema Neg Hx    Atopy Neg Hx    Eczema Neg Hx    Immunodeficiency Neg Hx    Urticaria Neg Hx    Family Psychiatric  History: see above Social History:  Social History   Substance and Sexual Activity  Alcohol Use No     Social History   Substance and Sexual Activity  Drug Use Not Currently   Types: Marijuana    Social History   Socioeconomic History   Marital status: Single    Spouse name: Not on file   Number of children: Not on file   Years of education: Not on  file   Highest education level: 12th grade  Occupational History   Not on file  Tobacco Use   Smoking status: Never   Smokeless tobacco: Never  Vaping Use   Vaping status: Former   Substances: Nicotine, Flavoring   Devices: Nicotine  Substance and Sexual Activity   Alcohol use: No   Drug use:  Not Currently    Types: Marijuana   Sexual activity: Not Currently  Other Topics Concern   Not on file  Social History Narrative   Lives with mom and stepdad.      He is in 12th grade at [employer] 23-24 school year      He enjoys writing and listen to music, writes poetry   Social Determinants of Health   Financial Resource Strain: Low Risk  (05/24/2023)   Overall Financial Resource Strain (CARDIA)    Difficulty of Paying Living Expenses: Not hard at all  Food Insecurity: No Food Insecurity (08/20/2023)   Hunger Vital Sign    Worried About Running Out of Food in the Last Year: Never true    Ran Out of Food in the Last Year: Never true  Transportation Needs: No Transportation Needs (08/20/2023)   PRAPARE - Administrator, Civil Service (Medical): No    Lack of Transportation (Non-Medical): No  Recent Concern: Transportation Needs - Unmet Transportation Needs (05/24/2023)   PRAPARE - Transportation    Lack of Transportation (Medical): Yes    Lack of Transportation (Non-Medical): Yes  Physical Activity: Unknown (05/24/2023)   Exercise Vital Sign    Days of Exercise per Week: 0 days    Minutes of Exercise per Session: Not on file  Stress: Stress Concern Present (05/24/2023)   [employer] of Occupational Health - Occupational Stress Questionnaire    Feeling of Stress : To some extent  Social Connections: Unknown (06/24/2023)   [facility]   Social Network    Social Network: Not on file    Hospital Course:  17 year old African American transgender male, was admitted voluntarily to the inpatient psychiatric unit following a self-inflicted laceration requiring 14 sutures. On admission, he reported feeling suicidal at the time of the incident but denied current suicidal ideation (SI). He expressed emotional distress regarding lapses in hormone replacement therapy (HRT) and challenges related to his gender transition. The patient was cooperative  during the assessment, demonstrating dysphoria, low-volume speech, and latency but no active psychotic features.The patient was monitored closely in a structured, safe environment to prevent further self-harm.He demonstrated compliance with safety protocols and was able to contract for safety.Medications were continued and optimized:The patient remained compliant with his medication regimen and denied any adverse effects during his stay.Daily counseling sessions focused on processing emotional distress related to gender dysphoria, self-harm behavior, and barriers to accessing HRT. The patient was referred to an endocrinologist for resumption of testosterone therapy and educated on the importance of timely follow-ups for HRT continuity.Initially dysphoric and withdrawn, the patient became increasingly engaged in therapy sessions.He denied SI, homicidal ideation (HI), and auditory/visual hallucinations (AVH) during the latter part of his stay.He maintained appropriate behavior and demonstrated insight into his mental health needs.Continue current regimen, with specific attention to medication adherence.Referred to endocrinology for testosterone therapy adjustments.Therapy Appointment: [facility] with Cornell Barman on 08/25/2023 at 5:00 PM. Medication Management: Appointment with Dr. Jerold Coombe at [facility] on 09/26/2023 at 10:00 AM.Reinforced the importance of medication adherence and consistent follow-up care. Discussed coping strategies for managing dysphoria and emotional distress.  Encouraged continued family involvement in his care plan.The patient stabilized during hospitalization and expressed readiness for discharge. He denied active SI/HI/AVH at discharge, engaged in creating a safety plan, and demonstrated an understanding of the importance of follow-up care. Discharged to the care of supportive family members.     Musculoskeletal: Strength & Muscle Tone: within normal limits Gait &  Station: normal Patient leans: N/A   Psychiatric Specialty Exam:  Presentation  General Appearance:  Fairly Groomed  Eye Contact: Minimal  Speech: Clear and Coherent; Normal Rate  Speech Volume: Normal  Handedness: Right   Mood and Affect  Mood: Euthymic  Affect: Flat   Thought Process  Thought Processes: Coherent  Descriptions of Associations:Intact  Orientation:-- (and situation)  Thought Content:Logical; WDL  History of Schizophrenia/Schizoaffective disorder:No data recorded Duration of Psychotic Symptoms:No data recorded Hallucinations:Hallucinations: None  Ideas of Reference:None  Suicidal Thoughts:Suicidal Thoughts: No SI Active Intent and/or Plan: -- (denies) SI Passive Intent and/or Plan: -- (denies)  Homicidal Thoughts:Homicidal Thoughts: No   Sensorium  Memory: Immediate Good; Remote Good  Judgment: Fair  Insight: Fair   Art therapist  Concentration: Fair  Attention Span: Fair  Recall: Good  Fund of Knowledge: Good  Language: Good   Psychomotor Activity  Psychomotor Activity: Psychomotor Activity: Normal   Assets  Assets: Communication Skills; Social Support; Housing   Sleep  Sleep: Sleep: Good Number of Hours of Sleep: 7    Physical Exam: Physical Exam Vitals and nursing note reviewed.  Constitutional:      Appearance: Normal appearance.  HENT:     Head: Normocephalic and atraumatic.     Nose: Nose normal.  Pulmonary:     Effort: Pulmonary effort is normal.  Musculoskeletal:        General: Normal range of motion.     Cervical back: Normal range of motion.  Neurological:     General: No focal deficit present.     Mental Status: He is alert and oriented to person, place, and time. Mental status is at baseline.  Psychiatric:        Attention and Perception: Attention and perception normal.        Mood and Affect: Mood and affect normal.        Speech: Speech normal.        Behavior:  Behavior normal. Behavior is cooperative.        Thought Content: Thought content normal.        Cognition and Memory: Cognition and memory normal.        Judgment: Judgment normal.    Review of Systems  Musculoskeletal:        Left arm with sutures intact dry  Skin:        Left arm with sutures intact dry  All other systems reviewed and are negative.  Blood pressure 128/86, pulse 98, temperature 98.2 F (36.8 C), temperature source Oral, resp. rate (!) 22, SpO2 97%. There is no height or weight on file to calculate BMI.   Social History   Tobacco Use  Smoking Status Never  Smokeless Tobacco Never   Tobacco Cessation:  N/A, patient does not currently use tobacco products   Blood Alcohol level:  Lab Results  Component Value Date   [facility] <10 08/19/2023   ETH <10 02/09/2023    Metabolic Disorder Labs:  Lab Results  Component Value Date   HGBA1C 5.1 12/30/2022   MPG 99.67 12/30/2022   MPG 105 09/01/2022   Lab Results  Component Value Date  PROLACTIN 10.0 09/01/2022   PROLACTIN 10.3 04/16/2022   Lab Results  Component Value Date   CHOL 200 (H) 12/30/2022   TRIG 114 12/30/2022   HDL 40 (L) 12/30/2022   CHOLHDL 5.0 12/30/2022   VLDL 23 12/30/2022   LDLCALC 137 (H) 12/30/2022   LDLCALC 170 (H) 09/01/2022    See Psychiatric Specialty Exam and Suicide Risk Assessment completed by Attending Physician prior to discharge.  Discharge destination:  Home  Is patient on multiple antipsychotic therapies at discharge:  No   Has Patient had three or more failed trials of antipsychotic monotherapy by history:  No  Recommended Plan for Multiple Antipsychotic Therapies: NA   Allergies as of 08/24/2023       Reactions   Egg-derived Products Anaphylaxis   Justicia Adhatoda Anaphylaxis, Other (See Comments)   All "TREE NUTS"   Peanut-containing Drug Products Anaphylaxis        Medication List     STOP taking these medications    busPIRone 10 MG tablet Commonly  known as: BUSPAR   omeprazole 40 MG capsule Commonly known as: PRILOSEC   venlafaxine XR 150 MG 24 hr capsule Commonly known as: EFFEXOR-XR       TAKE these medications      Indication  albuterol 108 (90 Base) MCG/ACT inhaler Commonly known as: VENTOLIN HFA Inhale 1-2 puffs into the lungs every 4 (four) hours as needed for wheezing or shortness of breath.  Indication: Asthma   ARIPiprazole 15 MG tablet Commonly known as: ABILIFY Take 1 tablet (15 mg total) by mouth at bedtime. What changed: See the new instructions.  Indication: Major Depressive Disorder   EPINEPHrine 0.3 mg/0.3 mL Soaj injection Commonly known as: EpiPen 2-Pak Inject 0.3 mg into the muscle as needed for anaphylaxis (AS DIRECTED).  Indication: Life-Threatening Hypersensitivity Reaction   naltrexone 50 MG tablet Commonly known as: DEPADE Take 1 tablet (50 mg total) by mouth daily.  Indication: self harm and impulsivity.   pantoprazole 40 MG tablet Commonly known as: PROTONIX Take 1 tablet (40 mg total) by mouth daily. Start taking on: August 25, 2023  Indication: Gastroesophageal Reflux Disease   Pataday 0.1 % ophthalmic solution Generic drug: olopatadine Place 1 drop into both eyes 3 (three) times daily as needed for allergies.  Indication: dry eyes   propranolol 10 MG tablet Commonly known as: INDERAL Take 1 tablet (10 mg total) by mouth at bedtime. What changed: See the new instructions.  Indication: Feeling Anxious   Testosterone Cypionate 200 MG/ML Soln Inject 80 mg into the skin every Sunday. What changed: when to take this  Indication: Transgender Man   traZODone 50 MG tablet Commonly known as: DESYREL Take 1-2 tablets (50-100 mg total) by mouth at bedtime as needed for sleep. What changed: additional instructions  Indication: Trouble Sleeping, Major Depressive Disorder        Follow-up Information     [facility].   Why: Cornell Barman appointment at 12/12  at 5 PM. Contact information: [address] [place] [address] [phone]         MEDICATION MANAGEMENT CLINIC [facility]. Go to.   Why: Medication Management appointment with Dr. Jerold Coombe 09/26/23 at 10 AM.                Follow-up recommendations:  Activity:  as tolerated Diet:  heart healthy  Comments:   Sertraline (Zoloft): 50 mg daily for depression and suicidal ideation. Aripiprazole (Abilify): 2 mg daily for mood stabilization  and psychotic features. Refer to a new endocrinologist for resumption of testosterone therapy. Provide contact information for local LGBTQ+ health services and endocrinology specialists. 988 Suicide & Crisis Lifeline: Dial 988 or chat at [email or web address]. [employer] (for [facility] individuals): Call 812-239-9654 or text START to [phone]. Trans Lifeline: Call 956-238-9132 for peer support. Schedule outpatient psychiatric follow-up within 7-10 days to monitor medication efficacy and safety. Provider: [facility] Reason: Medication management appointment with Dr. Jerold Coombe. Date & Time: 09/26/2023 at 10:00 AM Location: [facility]: Provider: [facility] Reason: Appointment with Cornell Barman for therapy. Date & Time: 08/25/2023 at 5:00 PM Location: [address] [place], [place], [place] [address] Contact Information: [phone]  Signed: Myriam Forehand, NP 08/24/2023, 12:55 PM

## 2023-08-24 NOTE — [facility] Suicide Risk Assessment (Signed)
[facility] Discharge Suicide Risk Assessment   Principal Problem: MDD (major depressive disorder), recurrent episode, severe (HCC) Discharge Diagnoses: Principal Problem:   MDD (major depressive disorder), recurrent episode, severe (HCC) Active Problems:   Deliberate self-cutting   Suicide attempt by cutting of wrist (HCC)   Total Time spent with patient: 1.5 hours  Musculoskeletal: Strength & Muscle Tone: within normal limits Gait & Station: normal Patient leans: N/A  Psychiatric Specialty Exam  Presentation  General Appearance:  Fairly Groomed  Eye Contact: Minimal  Speech: Clear and Coherent; Normal Rate  Speech Volume: Normal  Handedness: Right   Mood and Affect  Mood: Euthymic  Duration of Depression Symptoms: No data recorded Affect: Flat   Thought Process  Thought Processes: Coherent  Descriptions of Associations:Intact  Orientation:-- (and situation)  Thought Content:Logical; WDL  History of Schizophrenia/Schizoaffective disorder:none reported Duration of Psychotic Symptoms:none reported Hallucinations:Hallucinations: None  Ideas of Reference:None  Suicidal Thoughts:Suicidal Thoughts: No SI Active Intent and/or Plan: -- (denies) SI Passive Intent and/or Plan: -- (denies)  Homicidal Thoughts:Homicidal Thoughts: No   Sensorium  Memory: Immediate Good; Remote Good  Judgment: Fair  Insight: Fair   Art therapist  Concentration: Fair  Attention Span: Fair  Recall: Good  Fund of Knowledge: Good  Language: Good   Psychomotor Activity  Psychomotor Activity: Psychomotor Activity: Normal   Assets  Assets: Communication Skills; Social Support; Housing   Sleep  Sleep: Sleep: Good Number of Hours of Sleep: 7   Physical Exam: Physical Exam ROS Blood pressure 128/86, pulse 98, temperature 98.2 F (36.8 C), temperature source Oral, resp. rate (!) 22, SpO2 97%. There is no height or weight on file to calculate  BMI.  Mental Status Per Nursing Assessment::   On Admission:  Self-harm thoughts  Demographic Factors:  Male, Adolescent or young adult, Gay, lesbian, or bisexual orientation, and Low socioeconomic status  Loss Factors: Loss of significant relationship  Historical Factors: Prior suicide attempts, Family history of mental illness or substance abuse, and Impulsivity  Risk Reduction Factors:   Sense of responsibility to family, Living with another person, especially a relative, and Positive social support  Continued Clinical Symptoms:  Depression:   Comorbid alcohol abuse/dependence Impulsivity Alcohol/Substance Abuse/Dependencies Previous Psychiatric Diagnoses and Treatments  Cognitive Features That Contribute To Risk:  None    Suicide Risk:  Minimal: No identifiable suicidal ideation.  Patients presenting with no risk factors but with morbid ruminations; may be classified as minimal risk based on the severity of the depressive symptoms     Plan Of Care/Follow-up recommendations:  Activity:  as tolerated Diet:  heart healthy Sertraline (Zoloft): 50 mg daily for depression and suicidal ideation. Aripiprazole (Abilify): 2 mg daily for mood stabilization and psychotic features. Refer to a new endocrinologist for resumption of testosterone therapy. Provide contact information for local LGBTQ+ health services and endocrinology specialists. 988 Suicide & Crisis Lifeline: Dial 988 or chat at [employer].se. [employer] (for [facility] individuals): Call (581)823-9076 or text START to (434)158-8437. Trans Lifeline: Call 747-844-8143 for peer support. Schedule outpatient psychiatric follow-up within 7-10 days to monitor medication efficacy and safety. Provider: [facility] Reason: Medication management appointment with Dr. Jerold Coombe. Date & Time: 09/26/2023 at 10:00 AM Location: [facility]: Provider: [facility] Reason:  Appointment with Cornell Barman for therapy. Date & Time: 08/25/2023 at 5:00 PM Location: [address] [place], [place], [place] [address] Contact Information: [phone] Myriam Forehand, NP 08/24/2023, 12:41 PM

## 2023-08-24 NOTE — Progress Notes (Incomplete)
Discharge Note:  Patient denies SI/HI/AVH at this time. Discharge instructions, AVS, prescriptions, and transition recor gone over with patient. Patient agrees to comply with medication management, follow-up visit, and outpatient therapy. Patient belongings returned to patient. Patient questions and concerns addressed and answered. Patient ambulatory off unit. Patient discharged to home with parents.   

## 2023-08-24 NOTE — Plan of Care (Signed)
Problem: Education: Goal: Knowledge of [place] [facility] Education information/materials will improve 08/24/2023 1327 by Delos Haring, RN Outcome: Adequate for Discharge 08/24/2023 1327 by Delos Haring, RN Outcome: Progressing Goal: Emotional status will improve 08/24/2023 1327 by Delos Haring, RN Outcome: Adequate for Discharge 08/24/2023 1327 by Delos Haring, RN Outcome: Progressing Goal: Mental status will improve 08/24/2023 1327 by Delos Haring, RN Outcome: Adequate for Discharge 08/24/2023 1327 by Delos Haring, RN Outcome: Progressing Goal: Verbalization of understanding the information provided will improve 08/24/2023 1327 by Delos Haring, RN Outcome: Adequate for Discharge 08/24/2023 1327 by Delos Haring, RN Outcome: Progressing   Problem: Activity: Goal: Interest or engagement in activities will improve 08/24/2023 1327 by Delos Haring, RN Outcome: Adequate for Discharge 08/24/2023 1327 by Delos Haring, RN Outcome: Progressing Goal: Sleeping patterns will improve 08/24/2023 1327 by Delos Haring, RN Outcome: Adequate for Discharge 08/24/2023 1327 by Delos Haring, RN Outcome: Progressing   Problem: Coping: Goal: Ability to verbalize frustrations and anger appropriately will improve 08/24/2023 1327 by Delos Haring, RN Outcome: Adequate for Discharge 08/24/2023 1327 by Delos Haring, RN Outcome: Progressing Goal: Ability to demonstrate self-control will improve 08/24/2023 1327 by Delos Haring, RN Outcome: Adequate for Discharge 08/24/2023 1327 by Delos Haring, RN Outcome: Progressing   Problem: Health Behavior/Discharge Planning: Goal: Identification of resources available to assist in meeting health care needs will improve 08/24/2023 1327 by Delos Haring, RN Outcome: Adequate for Discharge 08/24/2023 1327 by Delos Haring, RN Outcome: Progressing Goal: Compliance with treatment plan for underlying cause of  condition will improve 08/24/2023 1327 by Delos Haring, RN Outcome: Adequate for Discharge 08/24/2023 1327 by Delos Haring, RN Outcome: Progressing   Problem: Physical Regulation: Goal: Ability to maintain clinical measurements within normal limits will improve 08/24/2023 1327 by Delos Haring, RN Outcome: Adequate for Discharge 08/24/2023 1327 by Delos Haring, RN Outcome: Progressing   Problem: Safety: Goal: Periods of time without injury will increase 08/24/2023 1327 by Delos Haring, RN Outcome: Adequate for Discharge 08/24/2023 1327 by Delos Haring, RN Outcome: Progressing   Problem: Education: Goal: Utilization of techniques to improve thought processes will improve 08/24/2023 1327 by Delos Haring, RN Outcome: Adequate for Discharge 08/24/2023 1327 by Delos Haring, RN Outcome: Progressing Goal: Knowledge of the prescribed therapeutic regimen will improve 08/24/2023 1327 by Delos Haring, RN Outcome: Adequate for Discharge 08/24/2023 1327 by Delos Haring, RN Outcome: Progressing   Problem: Activity: Goal: Interest or engagement in leisure activities will improve 08/24/2023 1327 by Delos Haring, RN Outcome: Adequate for Discharge 08/24/2023 1327 by Delos Haring, RN Outcome: Progressing Goal: Imbalance in normal sleep/wake cycle will improve 08/24/2023 1327 by Delos Haring, RN Outcome: Adequate for Discharge 08/24/2023 1327 by Delos Haring, RN Outcome: Progressing   Problem: Coping: Goal: Coping ability will improve 08/24/2023 1327 by Delos Haring, RN Outcome: Adequate for Discharge 08/24/2023 1327 by Delos Haring, RN Outcome: Progressing Goal: Will verbalize feelings 08/24/2023 1327 by Delos Haring, RN Outcome: Adequate for Discharge 08/24/2023 1327 by Delos Haring, RN Outcome: Progressing   Problem: Health Behavior/Discharge Planning: Goal: Ability to make decisions will improve 08/24/2023 1327 by Delos Haring, RN Outcome: Adequate for Discharge 08/24/2023 1327 by Delos Haring, RN Outcome: Progressing Goal: Compliance with therapeutic regimen will improve 08/24/2023 1327 by Delos Haring, RN Outcome: Adequate for Discharge 08/24/2023  1327 by Delos Haring, RN Outcome: Progressing   Problem: Role Relationship: Goal: Will demonstrate positive changes in social behaviors and relationships 08/24/2023 1327 by Delos Haring, RN Outcome: Adequate for Discharge 08/24/2023 1327 by Delos Haring, RN Outcome: Progressing   Problem: Safety: Goal: Ability to disclose and discuss suicidal ideas will improve 08/24/2023 1327 by Delos Haring, RN Outcome: Adequate for Discharge 08/24/2023 1327 by Delos Haring, RN Outcome: Progressing Goal: Ability to identify and utilize support systems that promote safety will improve 08/24/2023 1327 by Delos Haring, RN Outcome: Adequate for Discharge 08/24/2023 1327 by Delos Haring, RN Outcome: Progressing   Problem: Self-Concept: Goal: Will verbalize positive feelings about self 08/24/2023 1327 by Delos Haring, RN Outcome: Adequate for Discharge 08/24/2023 1327 by Delos Haring, RN Outcome: Progressing Goal: Level of anxiety will decrease 08/24/2023 1327 by Delos Haring, RN Outcome: Adequate for Discharge 08/24/2023 1327 by Delos Haring, RN Outcome: Progressing

## 2023-08-24 NOTE — Group Note (Signed)
Date:  08/24/2023 Time:  10:34 AM  Group Topic/Focus:  Building Self Esteem:   The Focus of this group is helping patients become aware of the effects of self-esteem on their lives, the things they and others do that enhance or undermine their self-esteem, seeing the relationship between their level of self-esteem and the choices they make and learning ways to enhance self-esteem.    Participation Level:  Active  Participation Quality:  Appropriate  Affect:  Appropriate  Cognitive:  Appropriate  Insight: Appropriate  Engagement in Group:  Engaged  Modes of Intervention:  Activity  Additional Comments:    Antonio [name] 08/24/2023, 10:34 AM

## 2023-08-24 NOTE — Progress Notes (Signed)
[facility] MD Progress Note  08/22/2023 12:35 PM Antonio [name]  MRN:  [id number] Subjective:  17 year old African American transgender male presenting with concerns about not receiving testosterone injections since January. He reports, "I haven't seen my endocrinologist since January, I will find another one." The patient demonstrates symptoms of suicidal ideation (SI), homicidal ideation (HI), and auditory/visual hallucinations (AVH). He reports frustration over delays in accessing hormone replacement therapy (HRT) and expressed feelings of dysphoria and emotional distress related to this. The patient denies specific stressors other than missing his "shot of testosterone" but remains guarded during the evaluation.   Principal Problem: MDD (major depressive disorder), recurrent episode, severe (HCC) Diagnosis: Principal Problem:   MDD (major depressive disorder), recurrent episode, severe (HCC) Active Problems:   Deliberate self-cutting   Suicide attempt by cutting of wrist (HCC)  Total Time spent with patient: 45 minutes  Past Psychiatric History: see below  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   Asthma    Bupropion overdose 01/16/2020   Complication of anesthesia    gets disoriented and shakes after surgery   Deliberate self-cutting 10/17/2018   Depression    Eczema    Fracture of 5th metatarsal    Gender dysphoria    Intentional overdose of drug in tablet form (HCC) 04/03/2020   MDD (major depressive disorder), recurrent severe, without psychosis (HCC) 11/02/2021   Moderate episode of recurrent major depressive disorder (HCC) 04/24/2020   Multiple allergies    Severe episode of recurrent major depressive disorder, without psychotic features (HCC) 01/02/2020   Suicidal ideations 08/31/2022   Suicide attempt by cutting of wrist (HCC) 07/28/2022    Past Surgical  History:  Procedure Laterality Date   SUPPRELIN IMPLANT Left 01/22/2019   Procedure: SUPPRELIN IMPLANT;  Surgeon: Kandice Hams, MD;  Location: [facility];  Service: Pediatrics;  Laterality: Left;   SUPPRELIN REMOVAL N/A 03/03/2020   Procedure: SUPPRELIN REMOVAL;  Surgeon: Kandice Hams, MD;  Location: [facility];  Service: Pediatrics;  Laterality: N/A;   TONSILLECTOMY     TYMPANOSTOMY TUBE PLACEMENT     Family History:  Family History  Problem Relation Age of Onset   Depression Mother    Anxiety disorder Mother    Hypertension Maternal Grandmother    Allergic rhinitis Neg Hx    Angioedema Neg Hx    Atopy Neg Hx    Eczema Neg Hx    Immunodeficiency Neg Hx    Urticaria Neg Hx    Family Psychiatric  History: see above Social History:  Social History   Substance and Sexual Activity  Alcohol Use No     Social History   Substance and Sexual Activity  Drug Use Not Currently   Types: Marijuana    Social History   Socioeconomic History   Marital status: Single    Spouse name: Not on file   Number of children: Not on file   Years of education: Not on file   Highest education level: 12th grade  Occupational History   Not on file  Tobacco Use   Smoking status: Never   Smokeless tobacco: Never  Vaping Use   Vaping status: Former   Substances: Nicotine, Flavoring   Devices: Nicotine  Substance and Sexual Activity   Alcohol use: No   Drug use: Not Currently    Types: Marijuana   Sexual activity: Not Currently  Other  Topics Concern   Not on file  Social History Narrative   Lives with mom and stepdad.      He is in 12th grade at [employer] 23-24 school year      He enjoys writing and listen to music, writes poetry   Social Determinants of Health   Financial Resource Strain: Low Risk  (05/24/2023)   Overall Financial Resource Strain (CARDIA)    Difficulty of Paying Living Expenses: Not hard at all  Food Insecurity: No Food  Insecurity (08/20/2023)   Hunger Vital Sign    Worried About Running Out of Food in the Last Year: Never true    Ran Out of Food in the Last Year: Never true  Transportation Needs: No Transportation Needs (08/20/2023)   PRAPARE - Administrator, Civil Service (Medical): No    Lack of Transportation (Non-Medical): No  Recent Concern: Transportation Needs - Unmet Transportation Needs (05/24/2023)   PRAPARE - Transportation    Lack of Transportation (Medical): Yes    Lack of Transportation (Non-Medical): Yes  Physical Activity: Unknown (05/24/2023)   Exercise Vital Sign    Days of Exercise per Week: 0 days    Minutes of Exercise per Session: Not on file  Stress: Stress Concern Present (05/24/2023)   [employer] of Occupational Health - Occupational Stress Questionnaire    Feeling of Stress : To some extent  Social Connections: Unknown (06/24/2023)   Received from [facility]   Social Network    Social Network: Not on file   Additional Social History:                         Sleep: Good  Appetite:  Good  Current Medications: Current Facility-Administered Medications  Medication Dose Route Frequency Provider Last Rate Last Admin   acetaminophen (TYLENOL) tablet 650 mg  650 mg Oral Q6H PRN Maryagnes Amos, FNP   650 mg at 08/20/23 0750   alum & mag hydroxide-simeth (MAALOX/MYLANTA) 200-200-20 MG/5ML suspension 30 mL  30 mL Oral Q4H PRN Maryagnes Amos, FNP       ARIPiprazole (ABILIFY) tablet 15 mg  15 mg Oral QHS Maryagnes Amos, FNP   15 mg at 08/23/23 2115   busPIRone (BUSPAR) tablet 10 mg  10 mg Oral BID Maryagnes Amos, FNP   10 mg at 08/24/23 4010   haloperidol (HALDOL) tablet 5 mg  5 mg Oral TID PRN Maryagnes Amos, FNP       And   diphenhydrAMINE (BENADRYL) capsule 50 mg  50 mg Oral TID PRN Maryagnes Amos, FNP       magnesium hydroxide (MILK OF MAGNESIA) suspension 30 mL  30 mL Oral Daily PRN Maryagnes Amos, FNP       naltrexone (DEPADE) tablet 50 mg  50 mg Oral Daily Maryagnes Amos, FNP   50 mg at 08/24/23 0903   pantoprazole (PROTONIX) EC tablet 40 mg  40 mg Oral Daily Myriam Forehand, NP   40 mg at 08/24/23 0904   propranolol (INDERAL) tablet 10 mg  10 mg Oral QHS Maryagnes Amos, FNP   10 mg at 08/23/23 2115   testosterone cypionate (DEPOTESTOSTERONE CYPIONATE) injection 80 mg  80 mg Intramuscular Once Myriam Forehand, NP       traZODone (DESYREL) tablet 50-100 mg  50-100 mg Oral QHS PRN Maryagnes Amos, FNP   50 mg at 08/23/23 2115   venlafaxine  XR (EFFEXOR-XR) 24 hr capsule 150 mg  150 mg Oral Q breakfast Maryagnes Amos, FNP   150 mg at 08/24/23 7253    Lab Results: No results found for this or any previous visit (from the past 48 hour(s)).  Blood Alcohol level:  Lab Results  Component Value Date   ETH <10 08/19/2023   ETH <10 02/09/2023    Metabolic Disorder Labs: Lab Results  Component Value Date   HGBA1C 5.1 12/30/2022   MPG 99.67 12/30/2022   MPG 105 09/01/2022   Lab Results  Component Value Date   PROLACTIN 10.0 09/01/2022   PROLACTIN 10.3 04/16/2022   Lab Results  Component Value Date   CHOL 200 (H) 12/30/2022   TRIG 114 12/30/2022   HDL 40 (L) 12/30/2022   CHOLHDL 5.0 12/30/2022   VLDL 23 12/30/2022   LDLCALC 137 (H) 12/30/2022   LDLCALC 170 (H) 09/01/2022    Physical Findings: AIMS:  , ,  ,  ,    CIWA:    COWS:     Musculoskeletal: Strength & Muscle Tone: within normal limits Gait & Station: normal Patient leans: N/A  Psychiatric Specialty Exam:  Presentation  General Appearance:  Appropriate for Environment  Eye Contact: Good  Speech: Clear and Coherent  Speech Volume: Normal  Handedness: Right   Mood and Affect  Mood: Anxious  Affect: Flat   Thought Process  Thought Processes: Coherent  Descriptions of Associations:Intact  Orientation:Full (Time, Place and Person)  Thought  Content:WDL  History of Schizophrenia/Schizoaffective disorder:none reported Duration of Psychotic Symptoms:none reported Hallucinations:Hallucinations: None  Ideas of Reference:None  Suicidal Thoughts:Suicidal Thoughts: No SI Active Intent and/or Plan: -- (denies) SI Passive Intent and/or Plan: -- (denies)  Homicidal Thoughts:Homicidal Thoughts: No   Sensorium  Memory: Immediate Good; Remote Good  Judgment: Fair  Insight: Fair   Art therapist  Concentration: Fair  Attention Span: Fair  Recall: Good  Fund of Knowledge: Good  Language: Good   Psychomotor Activity  Psychomotor Activity:Psychomotor Activity: Normal   Assets  Assets: Housing; Social Support; Communication Skills   Sleep  Sleep:Sleep: Good Number of Hours of Sleep: 7    Physical Exam: Physical Exam Vitals and nursing note reviewed.  Constitutional:      Appearance: Normal appearance.  HENT:     Head: Normocephalic and atraumatic.     Nose: Nose normal.  Pulmonary:     Effort: Pulmonary effort is normal.  Musculoskeletal:        General: Normal range of motion.     Cervical back: Normal range of motion.  Neurological:     General: No focal deficit present.     Mental Status: He is alert and oriented to person, place, and time. Mental status is at baseline.  Psychiatric:        Attention and Perception: Attention and perception normal.        Mood and Affect: Mood is anxious. Affect is flat.        Speech: Speech normal.        Behavior: Behavior normal. Behavior is cooperative.        Thought Content: Thought content normal.        Cognition and Memory: Cognition and memory normal.        Judgment: Judgment normal.    ROS Blood pressure 128/86, pulse 98, temperature 98.2 F (36.8 C), temperature source Oral, resp. rate (!) 22, SpO2 97%. There is no height or weight on file to calculate BMI.  Treatment Plan Summary: Daily contact with patient to assess and  evaluate symptoms and progress in treatment and Medication management Aripiprazole (Abilify) 15 mg Oral at bedtime manage mood disorders, including emotional dysregulation or psychotic symptoms. Buspirone (Buspar) 10 mg Oral BID generalized anxiety disorder by reducing excessive worry and tension.. Propranolol (Inderal) 10 mg Oral at bedtime xiety, such as tachycardia, and may prevent nightmares. Myriam Forehand, NP 08/24/2023, 12:35 PM

## 2023-08-27 NOTE — Progress Notes (Unsigned)
08/29/23- [name] courtesy of Dr Denny Levy with concern of hypersomnolence, complicated by F2M Transgender, Autism, Depression, Hx Suicide Attempt/ Self Harm, Allergic Rhinitis, Asthma, Food Allergy GERD Followed by [facility]. -Trazodone 50-100 HS,

## 2023-08-29 ENCOUNTER — Ambulatory Visit: Payer: 59 | Admitting: Internal Medicine

## 2023-08-29 ENCOUNTER — Encounter: Payer: Self-pay | Admitting: Family Medicine

## 2023-08-29 VITALS — BP 120/79 | HR 76 | Temp 98.4°F | Resp 18 | Ht 65.0 in | Wt 231.6 lb

## 2023-08-29 DIAGNOSIS — R0683 Snoring: Secondary | ICD-10-CM | POA: Diagnosis not present

## 2023-08-29 DIAGNOSIS — R400 Somnolence: Secondary | ICD-10-CM

## 2023-08-29 DIAGNOSIS — F332 Major depressive disorder, recurrent severe without psychotic features: Secondary | ICD-10-CM | POA: Diagnosis not present

## 2023-08-29 NOTE — Patient Instructions (Signed)
Order- schedule Split night sleep study   dx snoring, somnolence  Call me about 2 weeks after your sleep study for results and recommendations

## 2023-08-29 NOTE — Assessment & Plan Note (Signed)
Complex, to be followed by psychiatry. Very cautious about any controlled med.

## 2023-08-29 NOTE — Assessment & Plan Note (Signed)
Describes sleep fragmentation suggesting OSA, with compatible body habitus. Would not be able to come off psychotropic meds for MSLT, and denies cataplexy/sleep paralysis/hypnagogic hallucination suggestive of narcolepsy. Plan- sleep study at sleep center needed to exclude OSA in this complex individual.

## 2023-08-31 ENCOUNTER — Encounter: Payer: Self-pay | Admitting: Family Medicine

## 2023-08-31 ENCOUNTER — Ambulatory Visit (INDEPENDENT_AMBULATORY_CARE_PROVIDER_SITE_OTHER): Payer: 59 | Admitting: Family Medicine

## 2023-08-31 VITALS — BP 115/73 | HR 87 | Ht 65.0 in | Wt 229.2 lb

## 2023-08-31 DIAGNOSIS — L2084 Intrinsic (allergic) eczema: Secondary | ICD-10-CM

## 2023-08-31 DIAGNOSIS — F640 Transsexualism: Secondary | ICD-10-CM

## 2023-08-31 DIAGNOSIS — E669 Obesity, unspecified: Secondary | ICD-10-CM

## 2023-08-31 DIAGNOSIS — Z79899 Other long term (current) drug therapy: Secondary | ICD-10-CM

## 2023-08-31 DIAGNOSIS — Z7289 Other problems related to lifestyle: Secondary | ICD-10-CM

## 2023-08-31 MED ORDER — TESTOSTERONE CYPIONATE 200 MG/ML IJ SOLN: INTRAMUSCULAR | 1 refills | Status: DC

## 2023-08-31 MED ORDER — TRAZODONE HCL 50 MG PO TABS: 50.0000 mg | ORAL_TABLET | Freq: Every evening | ORAL | 0 refills | Status: DC | PRN

## 2023-08-31 MED ORDER — TESTOSTERONE CYPIONATE 200 MG/ML IJ SOLN: 80.0000 mg | INTRAMUSCULAR | 1 refills | Status: DC

## 2023-08-31 NOTE — Patient Instructions (Signed)
I have placed an order at [facility] for you to get a testosterone level and a cbc.  I would like to see you in about 6-8 weeks or so to follow up chronic issues. I am also scheduling you to see our skin specialist here at [place] [facility]. We will be able to remove  your stitches today as well. Have a happy holiday!

## 2023-09-01 ENCOUNTER — Ambulatory Visit (INDEPENDENT_AMBULATORY_CARE_PROVIDER_SITE_OTHER): Payer: 59 | Admitting: Family Medicine

## 2023-09-01 ENCOUNTER — Encounter: Payer: Self-pay | Admitting: Family Medicine

## 2023-09-01 VITALS — BP 110/75

## 2023-09-01 DIAGNOSIS — L989 Disorder of the skin and subcutaneous tissue, unspecified: Secondary | ICD-10-CM | POA: Diagnosis not present

## 2023-09-01 DIAGNOSIS — E669 Obesity, unspecified: Secondary | ICD-10-CM | POA: Insufficient documentation

## 2023-09-01 LAB — POCT SKIN KOH: Skin KOH, POC: NEGATIVE

## 2023-09-01 MED ORDER — BETAMETHASONE DIPROPIONATE 0.05 % EX OINT: TOPICAL_OINTMENT | Freq: Two times a day (BID) | CUTANEOUS | 0 refills | Status: DC

## 2023-09-01 NOTE — Assessment & Plan Note (Signed)
Refer to Dr. Lum Babe for evaluation. Appt made for tomorrow.

## 2023-09-01 NOTE — Patient Instructions (Signed)
We will take a look at your skin under the microscope. If no fungus is seen, I will send in the steroid for you to place on your ankle and elbow twice a day for 2 weeks. At that time, we would see you back in 2 weeks. I will keep you updated! Let me know if you have any questions.

## 2023-09-01 NOTE — Progress Notes (Signed)
    SUBJECTIVE:   CHIEF COMPLAINT / HPI:   Left elbow and right ankle skin lesion Both lesions have been present for years.  They wax and wane.  They are usually itchy.  Has tried Eucerin cream as well as triamcinolone in the past without much help.  Does not have a history of chronic irritation to either area.  PERTINENT  PMH / PSH: Asthma, GERD, insulin resistance, atopic dermatitis  OBJECTIVE:   BP 110/75   General: Alert and oriented, in NAD Skin: Warm, dry, and intact; left elbow with hyperpigmented patch; right posterior ankle with hyperkeratotic, hyperpigmented plaque with mild scale HEENT: NCAT, EOM grossly normal, midline nasal septum Respiratory: Breathing and speaking comfortably on RA Extremities: Moves all extremities grossly equally Neurological: No gross focal deficit      ASSESSMENT/PLAN:   Skin lesions Left elbow hyperpigmentation differential includes chronic contact dermatitis, postinflammatory changes from eczema, lichen simplex chronicus.  Right posterior ankle hyperkeratotic and hyperpigmented plaque differential includes chronic contact dermatitis, eczema, psoriasis, lichen simplex chronicus.  KOH scraping negative for fungal organisms.  Will prescribe 2-week course of betamethasone 0.05% ointment for use twice a day.  Follow-up in 2 weeks to assess improvement.   Janeal Holmes, MD [facility]

## 2023-09-01 NOTE — Assessment & Plan Note (Signed)
Left elbow hyperpigmentation differential includes chronic contact dermatitis, postinflammatory changes from eczema, lichen simplex chronicus.  Right posterior ankle hyperkeratotic and hyperpigmented plaque differential includes chronic contact dermatitis, eczema, psoriasis, lichen simplex chronicus.  KOH scraping negative for fungal organisms.  Will prescribe 2-week course of betamethasone 0.05% ointment for use twice a day.  Follow-up in 2 weeks to assess improvement.

## 2023-09-01 NOTE — Assessment & Plan Note (Signed)
Suspect weight gain mostly elated to olanzapine. I would refer back to his psychiatrist for discussion of options regarding this medicine. His weight gain is unlikely to be to the testosterone although it might have a small contribution.

## 2023-09-01 NOTE — Assessment & Plan Note (Signed)
Labs ordered. Will re-evaluate testosterone dosing based on results.

## 2023-09-01 NOTE — Progress Notes (Signed)
    CHIEF COMPLAINT / HPI:  F/u testosterone treatment for gender dysphoria Had bilateral mastectomy in October and is pleased with results 2. Pretty happy with transition so far. Feels dose of testosterone is appropriate. Did miss a dose of testosterone so it was 2-3 weeks since any dose.  Has questions about dosing. 3. Recently hospitalized for suicidal ideation/ stabilization/self inflicted laceration of left forearm. Tells me that the month was "rough" for him. Some angst related to missing his dog who died. Being followed by psychiatry and has a therapist. 4. Texts with his Mom while in room and she reminds him he needs to discuss his atopic dermatitis. Interested in considering our dermatology directed clinic here at [facility] as there is a long waiting list at the dermatologist office. 5. Wants to know if we can take the sutures out of his forearm, placed in ED. (08/19/2023). They told him to have them removed in 10 days. 6. Questions about possibility of getting an appetite suppressant due  to weight gain.   PERTINENT  PMH / PSH: I have reviewed the patient's medications, allergies, past medical and surgical history, smoking status and updated in the EMR as appropriate. Reviewed and updated medication list  OBJECTIVE:  BP 115/73   Pulse 87   Ht 5\' 5"  (1.651 m)   Wt 229 lb 3.2 oz (104 kg)   SpO2 99%   BMI 38.14 kg/m    ASSESSMENT / PLAN: Vital signs reviewed. GENERAL: Well-developed, well-nourished, no acute distress. CARDIOVASCULAR: Regular rate  NEURO: No gross focal neurological deficits. MSK: Movement of extremity x 4. Normal gait. Gets on and off table without assistance. SKIN left volar forearm laceration with healing, no sign of infection. Sutures clean. Hyperpigmentation of skin around the wound. Limited exam of remainder of skin reveals mild to moderate changes consistent with atopy. PSYCH: AxOx4. Moderate eye contact.. No psychomotor retardation or agitation. Speech volume  is low with some delay in answering questions which improves as the visit progresses. Answers questions appropriately. Communicates with his Mom via text throughout visit.  Suture removal. Removed 14 prolene interrupted sutures from left forearm. Healing well. Steri strips applied. Instructions about letting steri strips peel off. Keep it dry. Transgender man on hormone therapy Labs ordered. Will re-evaluate testosterone dosing based on results.  Intrinsic atopic dermatitis Refer to Dr. Lum Babe for evaluation. Appt made for tomorrow.  Deliberate self-cutting Removed sutures from laceration. It is healing well. Steri strips applied. Post suture removal care discussed.  Obesity (BMI 30-39.9) Suspect weight gain mostly elated to olanzapine. I would refer back to his psychiatrist for discussion of options regarding this medicine. His weight gain is unlikely to be to the testosterone although it might have a small contribution.   Denny Levy MD

## 2023-09-01 NOTE — Assessment & Plan Note (Signed)
Removed sutures from laceration. It is healing well. Steri strips applied. Post suture removal care discussed.

## 2023-09-02 LAB — CBC
Hematocrit: 44.2 % (ref 37.5–51.0)
Hemoglobin: 13.9 g/dL (ref 13.0–17.7)
MCH: 24.3 pg — ABNORMAL LOW (ref 26.6–33.0)
MCHC: 31.4 g/dL — ABNORMAL LOW (ref 31.5–35.7)
MCV: 77 fL — ABNORMAL LOW (ref 79–97)
Platelets: 282 10*3/uL (ref 150–450)
RBC: 5.73 x10E6/uL (ref 4.14–5.80)
RDW: 14 % (ref 11.6–15.4)
WBC: 4.1 10*3/uL (ref 3.4–10.8)

## 2023-09-02 LAB — TESTOSTERONE: Testosterone: 290 ng/dL (ref 150–785)

## 2023-09-05 ENCOUNTER — Encounter: Payer: Self-pay | Admitting: Internal Medicine

## 2023-09-06 ENCOUNTER — Encounter: Payer: Self-pay | Admitting: Family Medicine

## 2023-09-22 ENCOUNTER — Ambulatory Visit: Payer: 59 | Admitting: Family Medicine

## 2023-09-22 ENCOUNTER — Encounter: Payer: Self-pay | Admitting: Family Medicine

## 2023-09-22 ENCOUNTER — Telehealth: Payer: Self-pay | Admitting: Family Medicine

## 2023-09-22 VITALS — BP 118/80 | HR 89 | Wt 232.2 lb

## 2023-09-22 DIAGNOSIS — L309 Dermatitis, unspecified: Secondary | ICD-10-CM | POA: Diagnosis not present

## 2023-09-22 NOTE — Patient Instructions (Signed)
 Good to see you today - Thank you for coming in  Things we discussed today:  1) your eczema flare seems a lot better controlled today.  You can stop using the betamethasone  cream and resume using triamcinolone . -Make sure to regularly apply a thick moisturizer to keep the eczema rashes controlled. -You may need to use betamethasone  again in the future if you have another strong flare.  I would recommend avoid using this too often as it can cause skin thinning in the long run. - Please free to reach out if you have difficulty controlling your eczema again in the future

## 2023-09-22 NOTE — Progress Notes (Signed)
    SUBJECTIVE:   CHIEF COMPLAINT / HPI:   TG is a 18yo M w/ hx of eczema that p/f eczema f/u. - Feels like eczema on R ankle and L elbow are much better now - Last used betamethasone  about a week ago - Now just using lotion to keep it moisturize - Was previously using triamcinolone .   OBJECTIVE:   BP 118/80   Pulse (!) 121   Wt 232 lb 3.2 oz (105.3 kg)   SpO2 97%   BMI 38.64 kg/m   General: Alert, pleasant man. NAD. HEENT: NCAT. MMM. Resp: Normal WOB on RA.  Ext: Moves all ext spontaneously Skin: Hyperpigmented, thickened skin on R posterior ankle and extensor surface of L elbow.  ASSESSMENT/PLAN:   Assessment & Plan Eczema, unspecified type Chronic, recent acute exacerbation is now resolving. Pt is now satisfied with the control of his eczema.  Advised patient to resume using triamcinolone .  Counseled patient that it is okay to use betamethasone  again in future for flares, but should avoid chronic use of high-dose steroids.  Counseled patient on supportive management of eczema, including moisturizing skin.   Antonio Nearing, MD [facility]

## 2023-09-22 NOTE — Telephone Encounter (Signed)
 Called patient regarding their elevated heart rate at visit.  Patient is agreeable to coming back to clinic to get it rechecked.

## 2023-09-23 MED ORDER — TRIAMCINOLONE ACETONIDE 0.1 % EX OINT: TOPICAL_OINTMENT | Freq: Two times a day (BID) | CUTANEOUS | 0 refills | Status: DC | PRN

## 2023-09-26 ENCOUNTER — Telehealth (INDEPENDENT_AMBULATORY_CARE_PROVIDER_SITE_OTHER): Payer: 59 | Admitting: Child and Adolescent Psychiatry

## 2023-09-26 DIAGNOSIS — F4010 Social phobia, unspecified: Secondary | ICD-10-CM

## 2023-09-26 DIAGNOSIS — F649 Gender identity disorder, unspecified: Secondary | ICD-10-CM

## 2023-09-26 DIAGNOSIS — F840 Autistic disorder: Secondary | ICD-10-CM

## 2023-09-26 DIAGNOSIS — F411 Generalized anxiety disorder: Secondary | ICD-10-CM

## 2023-09-26 DIAGNOSIS — F39 Unspecified mood [affective] disorder: Secondary | ICD-10-CM

## 2023-09-26 DIAGNOSIS — Z79899 Other long term (current) drug therapy: Secondary | ICD-10-CM

## 2023-09-26 MED ORDER — METFORMIN HCL 500 MG PO TABS: 500.0000 mg | ORAL_TABLET | Freq: Two times a day (BID) | ORAL | 1 refills | Status: DC
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
## 2023-10-12 ENCOUNTER — Ambulatory Visit: Payer: 59 | Admitting: Family Medicine

## 2023-10-12 VITALS — BP 117/74 | HR 94 | Ht 65.0 in | Wt 228.0 lb

## 2023-10-12 DIAGNOSIS — F640 Transsexualism: Secondary | ICD-10-CM

## 2023-10-12 DIAGNOSIS — F4010 Social phobia, unspecified: Secondary | ICD-10-CM

## 2023-10-12 DIAGNOSIS — R400 Somnolence: Secondary | ICD-10-CM

## 2023-10-12 DIAGNOSIS — Z79899 Other long term (current) drug therapy: Secondary | ICD-10-CM

## 2023-10-12 DIAGNOSIS — E88819 Insulin resistance, unspecified: Secondary | ICD-10-CM

## 2023-10-12 NOTE — Assessment & Plan Note (Signed)
Happy with transition to date.  Status post bilateral mastectomy.  We checked testosterone level at last office visit and was appropriate.  Will continue current medication regimen and follow-up 2 to 3 months for this.

## 2023-10-12 NOTE — Assessment & Plan Note (Signed)
Said he had not heard from the sleep study.  Checked in his epic schedule and he has an appointment on March 17.  I gave him that information and said he may wish to call them to investigate cancellation list.  He still having some issues with fatigue from disruptive sleep.  Disruptive sleep is also increasing back pain.  Using a TENS unit for that intermittently with some relief.

## 2023-10-12 NOTE — Progress Notes (Signed)
CHIEF COMPLAINT / HPI: Follow-up transgender hormone therapy, doing well. 2.  Obesity and insulin resistance: Was started on metformin couple of weeks ago.  Doing well without any problem currently. 3.  Social anxiety disorder that is followed by psychiatry: Feels like he is doing pretty well right now.   PERTINENT  PMH / PSH: I have reviewed the patient's medications, allergies, past medical and surgical history, smoking status and updated in the EMR as appropriate.   OBJECTIVE:  BP 117/74   Pulse 94   Ht 5\' 5"  (1.651 m)   Wt 228 lb (103.4 kg)   SpO2 98%   BMI 37.94 kg/m  GENERAL: Well-developed no acute distress SKIN: Scar on the left volar forearm is hyperpigmented but no keloid formation PSYCH: AxOx4. Good eye contact.. No psychomotor retardation or agitation. Appropriate speech fluency and content. Asks and answers questions appropriately. Mood is congruent.   ASSESSMENT / PLAN:   Transgender man on hormone therapy Happy with transition to date.  Status post bilateral mastectomy.  We checked testosterone level at last office visit and was appropriate.  Will continue current medication regimen and follow-up 2 to 3 months for this.  Social anxiety disorder Some understandably anxiety about current political climate and potential issues with testosterone medication availability in the future.  Briefly discussed.  Insulin resistance Started on metformin on the 13th.  Is having some upset stomach with it but is trying to power through.  We discussed stopping it if he develops nausea and vomiting from a viral illness because that would be a double hit.  Will continue that at current dose for right now.  If he has any issues, he will let me know.  Otherwise we will follow this up in 2 months as well.  Daytime somnolence Said he had not heard from the sleep study.  Checked in his epic schedule and he has an appointment on March 17.  I gave him that information and said he may  wish to call them to investigate cancellation list.  He still having some issues with fatigue from disruptive sleep.  Disruptive sleep is also increasing back pain.  Using a TENS unit for that intermittently with some relief.  Antonio Levy MD   ASSESSMENT / PLAN:   Transgender man on hormone therapy Happy with transition to date.  Status post bilateral mastectomy.  We checked testosterone level at last office visit and was appropriate.  Will continue current medication regimen and follow-up 2 to 3 months for this.  Social anxiety disorder Some understandably anxiety about current political climate and potential issues with testosterone medication availability in the future.  Briefly discussed.  Insulin resistance Started on metformin on the 13th.  Is having some upset stomach with it but is trying to power through.  We discussed stopping it if he develops nausea and vomiting from a viral illness because that would be a double hit.  Will continue that at current dose for right now.  If he has any issues, he will let me know.  Otherwise we will follow this up in 2 months as well.  Daytime somnolence Said he had not heard from the sleep study.  Checked in his epic schedule and he has an appointment on March 17.  I gave him that information and said he may wish to call them to investigate cancellation list.  He still having some issues with fatigue from disruptive sleep.  Disruptive sleep is also increasing back pain.  Using a  TENS unit for that intermittently with some relief.  Antonio Levy MD

## 2023-10-12 NOTE — Patient Instructions (Addendum)
You have an appointment with Dr Maple Hudson for evaluation of sleep issues.March 17 at 09:30.  You could consider calling them and seeing if they have a cancellation list.   We discussed iron intake.  I have attached a list of iron rich foods. I will plan on seeing you ion a couple of months. Please let me know if you need anything before then

## 2023-10-12 NOTE — Assessment & Plan Note (Signed)
Some understandably anxiety about current political climate and potential issues with testosterone medication availability in the future.  Briefly discussed.

## 2023-10-12 NOTE — Assessment & Plan Note (Signed)
Started on metformin on the 13th.  Is having some upset stomach with it but is trying to power through.  We discussed stopping it if he develops nausea and vomiting from a viral illness because that would be a double hit.  Will continue that at current dose for right now.  If he has any issues, he will let me know.  Otherwise we will follow this up in 2 months as well.

## 2023-10-18 ENCOUNTER — Other Ambulatory Visit: Payer: Self-pay | Admitting: Child and Adolescent Psychiatry

## 2023-10-18 DIAGNOSIS — Z79899 Other long term (current) drug therapy: Secondary | ICD-10-CM

## 2023-10-24 ENCOUNTER — Telehealth (INDEPENDENT_AMBULATORY_CARE_PROVIDER_SITE_OTHER): Payer: 59 | Admitting: Child and Adolescent Psychiatry

## 2023-10-24 ENCOUNTER — Telehealth: Payer: Self-pay | Admitting: Internal Medicine

## 2023-10-24 DIAGNOSIS — Z79899 Other long term (current) drug therapy: Secondary | ICD-10-CM

## 2023-10-24 DIAGNOSIS — F411 Generalized anxiety disorder: Secondary | ICD-10-CM

## 2023-10-24 DIAGNOSIS — F649 Gender identity disorder, unspecified: Secondary | ICD-10-CM | POA: Diagnosis not present

## 2023-10-24 DIAGNOSIS — F39 Unspecified mood [affective] disorder: Secondary | ICD-10-CM

## 2023-10-24 DIAGNOSIS — F4010 Social phobia, unspecified: Secondary | ICD-10-CM | POA: Diagnosis not present

## 2023-10-24 DIAGNOSIS — F840 Autistic disorder: Secondary | ICD-10-CM

## 2023-10-24 DIAGNOSIS — R0683 Snoring: Secondary | ICD-10-CM

## 2023-10-24 MED ORDER — PROPRANOLOL HCL 10 MG PO TABS: 10.0000 mg | ORAL_TABLET | Freq: Every day | ORAL | 0 refills | Status: DC

## 2023-10-24 MED ORDER — VENLAFAXINE HCL ER 150 MG PO CP24: 150.0000 mg | ORAL_CAPSULE | Freq: Every day | ORAL | 1 refills | Status: DC

## 2023-10-24 MED ORDER — BUSPIRONE HCL 10 MG PO TABS: 10.0000 mg | ORAL_TABLET | Freq: Two times a day (BID) | ORAL | 1 refills | Status: DC

## 2023-10-24 MED ORDER — ARIPIPRAZOLE 15 MG PO TABS: 15.0000 mg | ORAL_TABLET | Freq: Every day | ORAL | 0 refills | Status: DC

## 2023-10-24 NOTE — Telephone Encounter (Signed)
 Antonio [name] mother states patient needs to be scheduled for sleep study. Antonio [name] phone number is [phone].
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
## 2023-10-24 NOTE — Telephone Encounter (Signed)
 I do not see an open order

## 2023-10-25 NOTE — Telephone Encounter (Addendum)
Dont know how its attached to something else can we do a new order

## 2023-10-26 NOTE — Telephone Encounter (Signed)
noted

## 2023-10-26 NOTE — Telephone Encounter (Signed)
PT only has behavioral health insurance   Does this pt want to pay out of pocket for this study?   Called pt and left vm for more details

## 2023-10-26 NOTE — Telephone Encounter (Signed)
Order has been placed.

## 2023-11-14 ENCOUNTER — Emergency Department (HOSPITAL_COMMUNITY)
Admission: EM | Admit: 2023-11-14 | Discharge: 2023-11-14 | Disposition: A | Discharge status: Home / Self Care | Attending: Emergency Medicine | Admitting: Emergency Medicine

## 2023-11-14 ENCOUNTER — Inpatient Hospital Stay
Admission: AD | Admit: 2023-11-14 | Discharge: 2023-11-20 | DRG: 885 | Disposition: A | Discharge status: Home / Self Care | Source: Intra-hospital | Attending: Psychiatry | Admitting: Psychiatry

## 2023-11-14 ENCOUNTER — Encounter (HOSPITAL_COMMUNITY): Payer: Self-pay

## 2023-11-14 DIAGNOSIS — F4010 Social phobia, unspecified: Secondary | ICD-10-CM | POA: Diagnosis present

## 2023-11-14 DIAGNOSIS — S51812A Laceration without foreign body of left forearm, initial encounter: Secondary | ICD-10-CM | POA: Insufficient documentation

## 2023-11-14 DIAGNOSIS — G4700 Insomnia, unspecified: Secondary | ICD-10-CM | POA: Diagnosis present

## 2023-11-14 DIAGNOSIS — Z91010 Allergy to peanuts: Secondary | ICD-10-CM | POA: Diagnosis not present

## 2023-11-14 DIAGNOSIS — E88819 Insulin resistance, unspecified: Secondary | ICD-10-CM | POA: Diagnosis present

## 2023-11-14 DIAGNOSIS — Z9151 Personal history of suicidal behavior: Secondary | ICD-10-CM | POA: Diagnosis not present

## 2023-11-14 DIAGNOSIS — Z9152 Personal history of nonsuicidal self-harm: Secondary | ICD-10-CM | POA: Diagnosis not present

## 2023-11-14 DIAGNOSIS — Z560 Unemployment, unspecified: Secondary | ICD-10-CM

## 2023-11-14 DIAGNOSIS — F603 Borderline personality disorder: Secondary | ICD-10-CM | POA: Diagnosis present

## 2023-11-14 DIAGNOSIS — Z7984 Long term (current) use of oral hypoglycemic drugs: Secondary | ICD-10-CM | POA: Diagnosis not present

## 2023-11-14 DIAGNOSIS — Z87891 Personal history of nicotine dependence: Secondary | ICD-10-CM

## 2023-11-14 DIAGNOSIS — Z79899 Other long term (current) drug therapy: Secondary | ICD-10-CM

## 2023-11-14 DIAGNOSIS — T1491XA Suicide attempt, initial encounter: Secondary | ICD-10-CM

## 2023-11-14 DIAGNOSIS — J45909 Unspecified asthma, uncomplicated: Secondary | ICD-10-CM | POA: Diagnosis present

## 2023-11-14 DIAGNOSIS — Z818 Family history of other mental and behavioral disorders: Secondary | ICD-10-CM | POA: Diagnosis not present

## 2023-11-14 DIAGNOSIS — X789XXA Intentional self-harm by unspecified sharp object, initial encounter: Secondary | ICD-10-CM | POA: Diagnosis present

## 2023-11-14 DIAGNOSIS — F411 Generalized anxiety disorder: Secondary | ICD-10-CM | POA: Diagnosis present

## 2023-11-14 DIAGNOSIS — F332 Major depressive disorder, recurrent severe without psychotic features: Principal | ICD-10-CM | POA: Diagnosis present

## 2023-11-14 DIAGNOSIS — Z5982 Transportation insecurity: Secondary | ICD-10-CM | POA: Diagnosis not present

## 2023-11-14 DIAGNOSIS — F640 Transsexualism: Secondary | ICD-10-CM | POA: Diagnosis present

## 2023-11-14 DIAGNOSIS — Z7289 Other problems related to lifestyle: Secondary | ICD-10-CM

## 2023-11-14 DIAGNOSIS — R45851 Suicidal ideations: Secondary | ICD-10-CM | POA: Diagnosis present

## 2023-11-14 DIAGNOSIS — Z8249 Family history of ischemic heart disease and other diseases of the circulatory system: Secondary | ICD-10-CM

## 2023-11-14 DIAGNOSIS — X781XXA Intentional self-harm by knife, initial encounter: Secondary | ICD-10-CM | POA: Insufficient documentation

## 2023-11-14 DIAGNOSIS — R4584 Anhedonia: Secondary | ICD-10-CM

## 2023-11-14 DIAGNOSIS — F649 Gender identity disorder, unspecified: Secondary | ICD-10-CM | POA: Diagnosis present

## 2023-11-14 LAB — COMPREHENSIVE METABOLIC PANEL
ALT: 19 U/L (ref 0–44)
AST: 18 U/L (ref 15–41)
Albumin: 4.2 g/dL (ref 3.5–5.0)
Alkaline Phosphatase: 74 U/L (ref 38–126)
Anion gap: 10 (ref 5–15)
BUN: 13 mg/dL (ref 6–20)
CO2: 22 mmol/L (ref 22–32)
Calcium: 9.3 mg/dL (ref 8.9–10.3)
Chloride: 105 mmol/L (ref 98–111)
Creatinine, Ser: 1.1 mg/dL (ref 0.61–1.24)
GFR, Estimated: >60 mL/min (ref >60)
Glucose, Bld: 78 mg/dL (ref 70–99)
Potassium: 4.1 mmol/L (ref 3.5–5.1)
Sodium: 137 mmol/L (ref 135–145)
Total Bilirubin: 0.9 mg/dL (ref 0.0–1.2)
Total Protein: 8 g/dL (ref 6.5–8.1)

## 2023-11-14 LAB — CBC
HCT: 42.8 % (ref 39.0–52.0)
Hemoglobin: 13.6 g/dL (ref 13.0–17.0)
MCH: 24.5 pg — ABNORMAL LOW (ref 26.0–34.0)
MCHC: 31.8 g/dL (ref 30.0–36.0)
MCV: 77.3 fL — ABNORMAL LOW (ref 80.0–100.0)
Platelets: 296 10*3/uL (ref 150–400)
RBC: 5.54 MIL/uL (ref 4.22–5.81)
RDW: 14.3 % (ref 11.5–15.5)
WBC: 6.2 10*3/uL (ref 4.0–10.5)
nRBC: 0 % (ref 0.0–0.2)

## 2023-11-14 LAB — SALICYLATE LEVEL: Salicylate Lvl: <7 mg/dL — ABNORMAL LOW (ref 7.0–30.0)

## 2023-11-14 LAB — RAPID URINE DRUG SCREEN, HOSP PERFORMED
Amphetamines: NOT DETECTED
Barbiturates: NOT DETECTED
Benzodiazepines: NOT DETECTED
Cocaine: NOT DETECTED
Opiates: NOT DETECTED
Tetrahydrocannabinol: NOT DETECTED

## 2023-11-14 LAB — ACETAMINOPHEN LEVEL: Acetaminophen (Tylenol), Serum: <10 ug/mL — ABNORMAL LOW (ref 10–30)

## 2023-11-14 LAB — ETHANOL: Alcohol, Ethyl (B): <10 mg/dL (ref <10)

## 2023-11-14 LAB — HCG, SERUM, QUALITATIVE: Preg, Serum: NEGATIVE

## 2023-11-14 MED ORDER — LIDOCAINE-EPINEPHRINE 2 %-1:100000 IJ SOLN
20.0000 mL | Freq: Once | INTRAMUSCULAR | Status: AC
  Administered 2023-11-14: 20 mL via INTRADERMAL
  Filled 2023-11-14: qty 1

## 2023-11-14 MED ORDER — DIPHENHYDRAMINE HCL 50 MG/ML IJ SOLN: 50.0000 mg | Freq: Three times a day (TID) | INTRAMUSCULAR | Status: DC | PRN

## 2023-11-14 MED ORDER — HALOPERIDOL LACTATE 5 MG/ML IJ SOLN: 10.0000 mg | Freq: Three times a day (TID) | INTRAMUSCULAR | Status: DC | PRN

## 2023-11-14 MED ORDER — MAGNESIUM HYDROXIDE 400 MG/5ML PO SUSP: 30.0000 mL | Freq: Every day | ORAL | Status: DC | PRN

## 2023-11-14 MED ORDER — LORAZEPAM 2 MG/ML IJ SOLN: 2.0000 mg | Freq: Three times a day (TID) | INTRAMUSCULAR | Status: DC | PRN

## 2023-11-14 MED ORDER — BUSPIRONE HCL 10 MG PO TABS
10.0000 mg | ORAL_TABLET | Freq: Two times a day (BID) | ORAL | Status: DC
Start: 2023-11-14 — End: 2023-11-14
  Administered 2023-11-14: 10 mg via ORAL
  Filled 2023-11-14: qty 1

## 2023-11-14 MED ORDER — ACETAMINOPHEN 325 MG PO TABS
650.0000 mg | ORAL_TABLET | Freq: Four times a day (QID) | ORAL | Status: DC | PRN
  Administered 2023-11-19 (×2): 650 mg via ORAL
  Filled 2023-11-14 (×3): qty 2

## 2023-11-14 MED ORDER — DIPHENHYDRAMINE HCL 25 MG PO CAPS: 50.0000 mg | ORAL_CAPSULE | Freq: Three times a day (TID) | ORAL | Status: DC | PRN

## 2023-11-14 MED ORDER — TRAZODONE HCL 50 MG PO TABS
50.0000 mg | ORAL_TABLET | Freq: Every day | ORAL | Status: DC
Start: 2023-11-14 — End: 2023-11-14
  Administered 2023-11-14: 50 mg via ORAL
  Filled 2023-11-14: qty 1

## 2023-11-14 MED ORDER — HALOPERIDOL LACTATE 5 MG/ML IJ SOLN: 5.0000 mg | Freq: Three times a day (TID) | INTRAMUSCULAR | Status: DC | PRN

## 2023-11-14 MED ORDER — ARIPIPRAZOLE 5 MG PO TABS
15.0000 mg | ORAL_TABLET | Freq: Every day | ORAL | Status: DC
  Administered 2023-11-14: 15 mg via ORAL
  Filled 2023-11-14: qty 1

## 2023-11-14 MED ORDER — ALUM & MAG HYDROXIDE-SIMETH 200-200-20 MG/5ML PO SUSP
30.0000 mL | ORAL | Status: DC | PRN
  Administered 2023-11-19: 30 mL via ORAL
  Filled 2023-11-14: qty 30

## 2023-11-14 MED ORDER — VENLAFAXINE HCL ER 75 MG PO CP24: 150.0000 mg | ORAL_CAPSULE | Freq: Every day | ORAL | Status: DC

## 2023-11-14 MED ORDER — HALOPERIDOL 5 MG PO TABS: 5.0000 mg | ORAL_TABLET | Freq: Three times a day (TID) | ORAL | Status: DC | PRN

## 2023-11-14 NOTE — Progress Notes (Signed)
 LCSW Progress Note:   Antonio [name]"   MRN: [id number]   11/14/2023  8:23 PM   Per Dahlia Byes, NP, inpatient psychiatric hospitalization is recommended once medically cleared. The patient is currently under voluntary admission status.   Followed up with [facility], Edythe Clarity, NP, to request that the patient be reviewed for bed availability. The situation is ongoing.

## 2023-11-14 NOTE — ED Triage Notes (Signed)
 Pt presents with c/o self-harm behavior. Pt has a deep laceration to his left arm, wound wrapped at this time, bleeding controlled. When asked by police if pt was SI, he simply shrugged his shoulders. Pt voluntary at this time, calm and cooperative. Pt does want to receive help today.

## 2023-11-14 NOTE — ED Provider Notes (Signed)
 [place] EMERGENCY DEPARTMENT AT [facility] Provider Note   CSN: [id number] Arrival date & time: 11/14/23  1332     History  Chief Complaint  Patient presents with   Suicidal    Antonio [name] is a 18 y.o. adult who presents with self harm. HX of BPD, Bipolar. Has been anhedonic and depressed. Sees therapist weekly. Patient states that he "blacked out" and does not remember cutting his arm today with a knife. He denies attempt at killing himself but does feel like he needs some help today and is here voluntarily.  He has been previously hospitalized for psychiatric illness with significant improvement.  HPI     Home Medications Prior to Admission medications   Medication Sig Start Date End Date Taking? Authorizing Provider  albuterol (VENTOLIN HFA) 108 (90 Base) MCG/ACT inhaler Inhale 1-2 puffs into the lungs every 4 (four) hours as needed for wheezing or shortness of breath. 06/08/23   Marcelyn Bruins, MD  ARIPiprazole (ABILIFY) 15 MG tablet Take 1 tablet (15 mg total) by mouth at bedtime. 10/24/23 11/23/23  [name], MD  betamethasone dipropionate (DIPROLENE) 0.05 % ointment Apply topically 2 (two) times daily. Do not use for more than 2 weeks. 09/01/23   Evette Georges, MD  busPIRone (BUSPAR) 10 MG tablet Take 1 tablet (10 mg total) by mouth 2 (two) times daily. 10/24/23   [name], MD  EPINEPHrine (EPIPEN 2-PAK) 0.3 mg/0.3 mL IJ SOAJ injection Inject 0.3 mg into the muscle as needed for anaphylaxis (AS DIRECTED). 06/08/23   Marcelyn Bruins, MD  metFORMIN (GLUCOPHAGE) 500 MG tablet TAKE 1 TABLET BY MOUTH 2 TIMES DAILY WITH A MEAL. 10/18/23   [name], MD  naltrexone (DEPADE) 50 MG tablet Take 1 tablet (50 mg total) by mouth daily. 07/18/23   [name], MD  omeprazole (PRILOSEC) 40 MG capsule     [provider]  PATADAY 0.1 % ophthalmic solution Place 1 drop into both eyes 3 (three) times daily as needed for allergies.     [provider]  propranolol (INDERAL) 10 MG tablet Take 1 tablet (10 mg total) by mouth at bedtime. 10/24/23 11/23/23  [name], MD  Testosterone Cypionate 200 MG/ML SOLN Inject weekly under the skin as directed 09/04/23   Nestor Ramp, MD  traZODone (DESYREL) 50 MG tablet Take 1-2 tablets (50-100 mg total) by mouth at bedtime as needed for sleep. 08/31/23 09/30/23  Nestor Ramp, MD  triamcinolone ointment (KENALOG) 0.1 % Apply topically 2 (two) times daily as needed (eczema). 09/23/23   Nestor Ramp, MD  venlafaxine XR (EFFEXOR XR) 150 MG 24 hr capsule Take 1 capsule (150 mg total) by mouth daily with breakfast. 10/24/23   [name], MD      Allergies    Egg-derived products, Bevelyn Buckles, and Peanut-containing drug products    Review of Systems   Review of Systems  Physical Exam Updated Vital Signs There were no vitals taken for this visit. Physical Exam Vitals and nursing note reviewed.  Constitutional:      General: He is not in acute distress.    Appearance: He is well-developed. He is not diaphoretic.  HENT:     Head: Normocephalic and atraumatic.     Right Ear: External ear normal.     Left Ear: External ear normal.     Nose: Nose normal.     Mouth/Throat:     Mouth: Mucous membranes  are moist.  Eyes:     General: No scleral icterus.    Conjunctiva/sclera: Conjunctivae normal.  Cardiovascular:     Rate and Rhythm: Normal rate and regular rhythm.     Heart sounds: Normal heart sounds. No murmur heard.    No friction rub. No gallop.  Pulmonary:     Effort: Pulmonary effort is normal. No respiratory distress.     Breath sounds: Normal breath sounds.  Abdominal:     General: Bowel sounds are normal. There is no distension.     Palpations: Abdomen is soft. There is no mass.     Tenderness: There is no abdominal tenderness. There is no guarding.  Musculoskeletal:     Cervical back: Normal range of motion.  Skin:    General: Skin is warm and  dry.  Neurological:     Mental Status: He is alert and oriented to person, place, and time.  Psychiatric:        Behavior: Behavior normal.     ED Results / Procedures / Treatments   Labs (all labs ordered are listed, but only abnormal results are displayed) Labs Reviewed  SALICYLATE LEVEL - Abnormal; Notable for the following components:      Result Value   Salicylate Lvl <7.0 (*)    All other components within normal limits  ACETAMINOPHEN LEVEL - Abnormal; Notable for the following components:   Acetaminophen (Tylenol), Serum <10 (*)    All other components within normal limits  CBC - Abnormal; Notable for the following components:   MCV 77.3 (*)    MCH 24.5 (*)    All other components within normal limits  COMPREHENSIVE METABOLIC PANEL  ETHANOL  RAPID URINE DRUG SCREEN, HOSP PERFORMED  HCG, SERUM, QUALITATIVE    EKG None  Radiology No results found.  Procedures .Laceration Repair  Date/Time: 11/14/2023 6:02 PM  Performed by: Arthor Captain, PA-C Authorized by: Arthor Captain, PA-C   Consent:    Consent obtained:  Verbal   Consent given by:  Patient   Risks discussed:  Need for additional repair and poor cosmetic result Universal protocol:    Patient identity confirmed:  Arm band Anesthesia:    Anesthesia method:  Local infiltration   Local anesthetic:  Lidocaine 2% WITH epi Laceration details:    Location:  Shoulder/arm   Shoulder/arm location:  L lower arm   Length (cm):  10 Pre-procedure details:    Preparation:  Patient was prepped and draped in usual sterile fashion Exploration:    Wound exploration: wound explored through full range of motion and entire depth of wound visualized     Wound extent: fascia not violated, no foreign body, no signs of injury, no tendon damage and no vascular damage   Treatment:    Area cleansed with:  Povidone-iodine and Shur-Clens   Irrigation solution:  Sterile saline   Irrigation method:  Pressure wash Skin repair:     Repair method:  Sutures   Suture size:  5-0   Suture material:  Prolene   Suture technique:  Running locked   Number of sutures:  10 Approximation:    Approximation:  Close Repair type:    Repair type:  Simple Post-procedure details:    Dressing:  Non-adherent dressing   Procedure completion:  Tolerated well, no immediate complications     Medications Ordered in ED Medications  lidocaine-EPINEPHrine (XYLOCAINE W/EPI) 2 %-1:100000 (with pres) injection 20 mL (has no administration in time range)    ED Course/ Medical Decision  Making/ A&P                                 Medical Decision Making Amount and/or Complexity of Data Reviewed Labs: ordered.  Risk Prescription drug management.   Patient here with self-harm, anhedonia.  Labs reviewed no acute findings.  Wound closed and bandaged.  Patient is medically clear for psychiatric evaluation.   nt your independent review of radiology images, and any outside records:1}    Final Clinical Impression(s) / ED Diagnoses Final diagnoses:  None    Rx / DC Orders ED Discharge Orders     None         Arthor Captain, PA-C 11/14/23 1804    Rexford Maus, DO 11/14/23 2205

## 2023-11-14 NOTE — BH Assessment (Addendum)
 LCSW Progress Note:    Antonio [name]"    MRN: [id number]    11/14/2023   8:23 PM     Per Dahlia Byes, NP, inpatient psychiatric hospitalization is recommended once medically cleared. The patient is currently under voluntary admission status.   Followed up with [facility], Edythe Clarity, NP, to request that the patient be reviewed for bed availability. Patient accepted to [facility], room assignment 324. The accepting provider is Irwin Brakeman MD.

## 2023-11-14 NOTE — Consult Note (Signed)
 Antonio Antonio [name]  Patient Name: .Antonio Antonio [name]  MRN: [id number]  DOB: August 18, 2006  Consult Order details:  Orders (From admission, onward)     Start     Ordered   11/14/23 1645  CONSULT TO CALL ACT TEAM       Ordering Provider: Arthor Captain, PA-Antonio [name]  Provider:  (Not yet assigned)  Question:  Reason for Consult?  Answer:  Psych consult   11/14/23 1645             Mode of Visit: In person    Psychiatry Consult Evaluation  Service Date: November 14, 2023 LOS:  LOS: 0 days  Chief Complaint Self inflicted cut to Left Forearm required sutures.  Primary Psychiatric Diagnoses  Suicide attempt-Self inflicted cut 2.  Borderline Personality disorder  Assessment  Antonio Antonio [name] is a 18 y.o. adult admitted: Presented to the EDfor 11/14/2023  1:39 PM for self inflicted deep cut to left arm that require sutures. He carries the psychiatric diagnoses of MDD, Multiple suicide attempts, Borderline Personality disorder,Transgender man on Hormone therapy, social anxiety and Deliberate cutting self and has a past medical history of  Obesity, Intrinsic Atopic Dermatitis.   His current presentation of self made cut to left fore arm is most consistent with prior behavior of deliberate cut for no reason.Marland Kitchen He meets criteria for inpatient Psychiatry hospitalization based on the impulsive behavior .  Current outpatient psychotropic medications include Aripiprazole, Trazodone, Venlafaxine, Naltrexone, Buspirone and historically he has had a positive response to these medications. He was  compliant with medications prior to admission as evidenced by his reporting so. On Antonio [name] examination, patient was calm, cooperative, denied feeling anxious or depressed.. Please see plan below for detailed recommendations.   Diagnoses:  Active Hospital problems: Principal Problem:   Suicide attempt by cutting of wrist [facility]) Active Problems:   Borderline personality disorder (HCC)    Plan   ##  Psychiatric Medication Recommendations:  Resume Venlafaxine XR, 150 mg po daily for depression Trazodone 50 mg po at bed time for sleep Aripiprazole 15 mg at bed time for Psychosis Buspirone  10 mg po bid for anxiety ## Medical Decision Making Capacity:  Patient is his own guardian and makes his own decision.  ## Further Work-up:   -- most recent EKG on 11/14/23 had QtC of 420 -- Pertinent labwork reviewed earlier this admission includes: CBC, CMP, Preg serum, Alcohol, UDS   ## Disposition:-- We recommend inpatient psychiatric hospitalization when medically cleared. Patient is under voluntary admission status at this time; please IVC if attempts to leave hospital.  ## Behavioral / Environmental: -To minimize splitting of staff, assign one staff person to communicate all information from the team when feasible. or Utilize compassion and acknowledge the patient's experiences while setting clear and realistic expectations for care.    ## Safety and Observation Level:  - Based on my clinical evaluation, I estimate the patient to be at Moderate to High  risk of self harm in the current setting. - At this time, we recommend  routine. This decision is based on my review of the chart including patient's history and current presentation, interview of the patient, mental status examination, and consideration of suicide risk including evaluating suicidal ideation, plan, intent, suicidal or self-harm behaviors, risk factors, and protective factors. This judgment is based on our ability to directly address suicide risk, implement suicide prevention strategies, and develop a safety plan while the patient is in the clinical setting. Please contact our team  if there is a concern that risk level has changed.  CSSR Risk Category:Antonio [name]-SSRS RISK CATEGORY: High Risk  Suicide Risk Assessment: Patient has following modifiable risk factors for suicide: untreated depression, under treated depression , and recklessness,  which we are addressing by by recommending inpatient Psychiatry hospitalization.. Patient has following non-modifiable or demographic risk factors for suicide: male gender, history of self harm behavior, and psychiatric hospitalization Patient has the following protective factors against suicide: Access to outpatient mental health care, Supportive family, Supportive friends, and Frustration tolerance  Thank you for this consult request. Recommendations have been communicated to the primary team.  We will seek inpatient Psychiatry hospitalization  at this time.   Antonio Navy, NP-PMHNP-BC       History of Present Illness  Relevant Aspects of Hospital ED Course:  Admitted on 11/14/2023 for Self inflicted cut to Left Forearm required suture   Patient is a AA transgender male on Hormone therapy  brought in by Police after he deeply cut his left Fore arm.  Patient is well known to this ER and staff for multiple suicide attempts by self inflicted cuts to arms.  Patient was seen this evening by this provider.  He was calm and cooperative and engaged in meaningful conversation.  Patient adamantly denied feeling depressed or anxious.  He added he does not know why he cut his forearm but stated it could be due to impulse.  Patient denied any altercation with anybody, states parents are supportive.  He also states that after cutting self he called the Police.  He wants care and that was the reason he called Police.  Patient admitted that he used Knife to cut himself and he required sutures on arrival to the ER.  Patient is well groomed but obese.  He is currently taking some Hormone  treatment.  He completed high school and plans to pursue higher education later.  Patient is always calm and cooperative when he comes to the ER.  He strongly believes he cut his left forearm out of impulse because he is not angry about anything or anybody.  Patient sees a therapist weekly and has a Therapist, sports in the community.   He reported poor sleep last night and that he had to get up and took his sleeping pills.  He added after taking the pill he slept off.  Currently he is not working and stays with his parents.  Patient denies SI/HI/AVH. We will resume home Medications, will seek inpatient Psychiatry hospitalization.  Psych ROS:  Depression: denies Anxiety:  denies Mania (lifetime and current): denies Psychosis: (lifetime and current): dENIES  Collateral information:  NA  Review of Systems  Constitutional: Negative.   HENT: Negative.    Eyes: Negative.   Respiratory: Negative.    Cardiovascular: Negative.   Gastrointestinal: Negative.   Genitourinary: Negative.   Musculoskeletal: Negative.   Skin:        Deep cut to left fore arm requiring sutures.  Neurological: Negative.   Endo/Heme/Allergies: Negative.   Psychiatric/Behavioral:  The patient is nervous/anxious.      Psychiatric and Social History  Psychiatric History:  Information collected from Patient/ Chart Review  Prev Dx/Sx: see above Current Psych Provider: Lorenso Antonio [name], Psychiatrist  Home Meds (current): see above Previous Med Trials: see above Therapy: yes  Prior Psych Hospitalization: yes  Prior Self Harm: yes Prior Violence: denies  Family Psych History: Depression, depression-Mother Family Hx suicide: unknown  Social History:  Developmental Hx: wnl Educational Hx: Completed high school  Occupational Hx: none Legal Hx: denies Living Situation: with Parents Spiritual Hx: no Access to weapons/lethal means: Denies   Substance History Alcohol: Denies  Illicit drugs: denies Prescription drug abuse: denies Rehab hx: denies  Exam Findings  Physical Exam:  Vital Signs:  Pulse Rate:  [96] 96 (03/03 1800) Resp:  [17] 17 (03/03 1800) BP: (119)/(78) 119/78 (03/03 1800) SpO2:  [99 %] 99 % (03/03 1800) Blood pressure 119/78, pulse 96, resp. rate 17, SpO2 99%. There is no height or weight on file to calculate  BMI.  Physical Exam Vitals and nursing note reviewed.  Constitutional:      Appearance: He is obese.  HENT:     Nose: Nose normal.  Cardiovascular:     Rate and Rhythm: Normal rate and regular rhythm.  Pulmonary:     Effort: Pulmonary effort is normal.  Musculoskeletal:        General: Normal range of motion.  Skin:    General: Skin is dry.     Comments: Deep cut to left fore arm- required sutures -self inflicted.  Neurological:     Mental Status: He is alert and oriented to person, place, and time.  Psychiatric:        Attention and Perception: Attention and perception normal.        Mood and Affect: Mood and affect normal.        Speech: Speech normal.        Behavior: Behavior normal. Behavior is cooperative.        Thought Content: Thought content normal.        Judgment: Judgment is impulsive and inappropriate.     Mental Status Exam: General Appearance:  obese  Orientation:  Full (Time, Place, and Person)  Memory:  Immediate;   Good Recent;   Good Remote;   Good  Concentration:  Concentration: Good and Attention Span: Good  Recall:  Good  Attention  Good  Eye Contact:  Good  Speech:  Clear and Coherent  Language:  Good  Volume:  Normal  Mood: "  I am ok, not depressed, not anxious"  Affect:  Congruent  Thought Process:  Coherent and Goal Directed  Thought Content:  Logical  Suicidal Thoughts:  No  Homicidal Thoughts:  No  Judgement:  Poor  Insight:  Good  Psychomotor Activity:  Normal  Akathisia:  NA  Fund of Knowledge:  Fair      Assets:  Manufacturing systems engineer Desire for Improvement Housing Social Support  Cognition:  WNL  ADL's:  Intact  AIMS (if indicated):        Other History   These have been pulled in through the EMR, reviewed, and updated if appropriate.  Family History:  The patient's family history includes Anxiety disorder in his mother; Depression in his mother; Hypertension in his maternal grandmother.  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   Asthma    Bupropion overdose 01/16/2020   Complication of anesthesia    gets disoriented and shakes after surgery   Deliberate self-cutting 10/17/2018   Depression    Eczema    Fracture of 5th metatarsal    Gender dysphoria    Intentional overdose of drug in tablet form (HCC) 04/03/2020   MDD (major depressive disorder), recurrent severe, without psychosis (HCC) 11/02/2021   Moderate episode of recurrent major depressive disorder (HCC) 04/24/2020   Multiple allergies    Severe episode  of recurrent major depressive disorder, without psychotic features (HCC) 01/02/2020   Suicidal ideations 08/31/2022   Suicide attempt by cutting of wrist (HCC) 07/28/2022    Surgical History: Past Surgical History:  Procedure Laterality Date   SUPPRELIN IMPLANT Left 01/22/2019   Procedure: SUPPRELIN IMPLANT;  Surgeon: Kandice Hams, MD;  Location: [facility];  Service: Pediatrics;  Laterality: Left;   SUPPRELIN REMOVAL N/A 03/03/2020   Procedure: SUPPRELIN REMOVAL;  Surgeon: Kandice Hams, MD;  Location: [facility];  Service: Pediatrics;  Laterality: N/A;   TONSILLECTOMY     TYMPANOSTOMY TUBE PLACEMENT       Medications:   Current Facility-Administered Medications:    ARIPiprazole (ABILIFY) tablet 15 mg, 15 mg, Oral, QHS, Antonio Blanchfield C, NP   busPIRone (BUSPAR) tablet 10 mg, 10 mg, Oral, BID, Antonio Mask C, NP   traZODone (DESYREL) tablet 50 mg, 50 mg, Oral, QHS, Antonio Dyar C, NP   [START ON 11/15/2023] venlafaxine XR (EFFEXOR-XR) 24 hr capsule 150 mg, 150 mg, Oral, Q breakfast, Antonio Melendrez C, NP  Current Outpatient Medications:    albuterol (VENTOLIN HFA) 108 (90 Base) MCG/ACT inhaler, Inhale 1-2 puffs into the lungs every 4 (four) hours as needed for wheezing or shortness of breath., Disp: 18 g, Rfl: 1    ARIPiprazole (ABILIFY) 15 MG tablet, Take 1 tablet (15 mg total) by mouth at bedtime., Disp: 30 tablet, Rfl: 0   betamethasone dipropionate (DIPROLENE) 0.05 % ointment, Apply topically 2 (two) times daily. Do not use for more than 2 weeks., Disp: 45 g, Rfl: 0   busPIRone (BUSPAR) 10 MG tablet, Take 1 tablet (10 mg total) by mouth 2 (two) times daily., Disp: 60 tablet, Rfl: 1   EPINEPHrine (EPIPEN 2-PAK) 0.3 mg/0.3 mL IJ SOAJ injection, Inject 0.3 mg into the muscle as needed for anaphylaxis (AS DIRECTED)., Disp: 2 each, Rfl: 1   metFORMIN (GLUCOPHAGE) 500 MG tablet, TAKE 1 TABLET BY MOUTH 2 TIMES DAILY WITH A MEAL., Disp: 180 tablet, Rfl: 1   naltrexone (DEPADE) 50 MG tablet, Take 1 tablet (50 mg total) by mouth daily., Disp: 90 tablet, Rfl: 1   omeprazole (PRILOSEC) 40 MG capsule, , Disp: , Rfl:    PATADAY 0.1 % ophthalmic solution, Place 1 drop into both eyes 3 (three) times daily as needed for allergies., Disp: , Rfl:    propranolol (INDERAL) 10 MG tablet, Take 1 tablet (10 mg total) by mouth at bedtime., Disp: 30 tablet, Rfl: 0   Testosterone Cypionate 200 MG/ML SOLN, Inject weekly under the skin as directed, Disp: 4 mL, Rfl: 1   traZODone (DESYREL) 50 MG tablet, Take 1-2 tablets (50-100 mg total) by mouth at bedtime as needed for sleep., Disp: 30 tablet, Rfl: 0   triamcinolone ointment (KENALOG) 0.1 %, Apply topically 2 (two) times daily as needed (eczema)., Disp: 30 g, Rfl: 0   venlafaxine XR (EFFEXOR XR) 150 MG 24 hr capsule, Take 1 capsule (150 mg total) by mouth daily with breakfast., Disp: 30 capsule, Rfl: 1  Allergies: Allergies  Allergen Reactions   Egg-Derived Products Anaphylaxis   Justicia Adhatoda Anaphylaxis and Other (See Comments)    All "TREE NUTS"   Peanut-Containing Drug Products Anaphylaxis    Antonio Navy, NP

## 2023-11-14 NOTE — Discharge Instructions (Addendum)
WOUND CARE Please have your stitches/staples removed in 7days or sooner if you have concerns. You may do this at any available urgent care or at your primary care doctor's office.  Keep area clean and dry for 24 hours. Do not remove bandage, if applied.  After 24 hours, remove bandage and wash wound gently with mild soap and warm water. Reapply a new bandage after cleaning wound, if directed.  Continue daily cleansing with soap and water until stitches/staples are removed.  Do not apply any ointments or creams to the wound while stitches/staples are in place, as this may cause delayed healing.  Seek medical careif you experience any of the following signs of infection: Swelling, redness, pus drainage, streaking, fever >101.0 F  Seek care if you experience excessive bleeding that does not stop after 15-20 minutes of constant, firm pressure.   

## 2023-11-15 ENCOUNTER — Other Ambulatory Visit: Payer: Self-pay

## 2023-11-15 ENCOUNTER — Encounter: Payer: Self-pay | Admitting: Nurse Practitioner

## 2023-11-15 DIAGNOSIS — F332 Major depressive disorder, recurrent severe without psychotic features: Secondary | ICD-10-CM

## 2023-11-15 DIAGNOSIS — X789XXA Intentional self-harm by unspecified sharp object, initial encounter: Secondary | ICD-10-CM | POA: Diagnosis not present

## 2023-11-15 DIAGNOSIS — F603 Borderline personality disorder: Secondary | ICD-10-CM

## 2023-11-15 DIAGNOSIS — T1491XA Suicide attempt, initial encounter: Secondary | ICD-10-CM

## 2023-11-15 MED ORDER — METFORMIN HCL 500 MG PO TABS
500.0000 mg | ORAL_TABLET | Freq: Two times a day (BID) | ORAL | Status: DC
  Administered 2023-11-16 – 2023-11-20 (×9): 500 mg via ORAL
  Filled 2023-11-15 (×9): qty 1

## 2023-11-15 MED ORDER — ARIPIPRAZOLE 5 MG PO TABS
15.0000 mg | ORAL_TABLET | Freq: Every day | ORAL | Status: DC
  Administered 2023-11-15 – 2023-11-19 (×5): 15 mg via ORAL
  Filled 2023-11-15 (×5): qty 1

## 2023-11-15 MED ORDER — TRAZODONE HCL 50 MG PO TABS
50.0000 mg | ORAL_TABLET | Freq: Every day | ORAL | Status: DC
  Administered 2023-11-15 – 2023-11-19 (×5): 50 mg via ORAL
  Filled 2023-11-15 (×5): qty 1

## 2023-11-15 MED ORDER — BUSPIRONE HCL 5 MG PO TABS
10.0000 mg | ORAL_TABLET | Freq: Two times a day (BID) | ORAL | Status: DC
  Administered 2023-11-15 – 2023-11-20 (×10): 10 mg via ORAL
  Filled 2023-11-15 (×10): qty 2

## 2023-11-15 MED ORDER — VENLAFAXINE HCL ER 75 MG PO CP24
150.0000 mg | ORAL_CAPSULE | Freq: Every day | ORAL | Status: DC
  Administered 2023-11-16 – 2023-11-20 (×5): 150 mg via ORAL
  Filled 2023-11-15 (×5): qty 2

## 2023-11-15 MED ORDER — MELATONIN 5 MG PO TABS
5.0000 mg | ORAL_TABLET | Freq: Every day | ORAL | Status: DC
Start: 2023-11-15 — End: 2023-11-20
  Administered 2023-11-15 – 2023-11-19 (×5): 5 mg via ORAL
  Filled 2023-11-15 (×5): qty 1

## 2023-11-15 NOTE — Tx Team (Signed)
 Initial Treatment Plan 11/15/2023 4:04 AM [name] ION:[id number]    PATIENT STRESSORS: Financial difficulties   Medication change or noncompliance     PATIENT STRENGTHS: Ability for insight  Motivation for treatment/growth    PATIENT IDENTIFIED PROBLEMS: Depression  Suicidal Ideation  Anxiety                 DISCHARGE CRITERIA:  Motivation to continue treatment in a less acute level of care Verbal commitment to aftercare and medication compliance  PRELIMINARY DISCHARGE PLAN: Outpatient therapy Return to previous living arrangement  PATIENT/FAMILY INVOLVEMENT: This treatment plan has been presented to and reviewed with the patient, Antonio [name].  The patient has been given the opportunity to ask questions and make suggestions.  Elmyra Ricks, RN 11/15/2023, 4:04 AM

## 2023-11-15 NOTE — Group Note (Signed)
 Recreation Therapy Group Note   Group Topic:Coping Skills  Group Date: 11/15/2023 Start Time: 1000 End Time: 1050 Facilitators: Rosina Lowenstein, LRT, CTRS Location:  Craft Room  Group Description: Mind Map.  Patient was provided a blank template of a diagram with 32 blank boxes in a tiered system, branching from the center (similar to a bubble chart). LRT directed patients to label the middle of the diagram "Coping Skills". LRT and patients then came up with 8 different coping skills as examples. Pt were directed to record their coping skills in the 2nd tier boxes closest to the center.  Patients would then share their coping skills with the group as LRT wrote them out. LRT gave a handout of 99 different coping skills at the end of group.   Goal Area(s) Addressed: Patients will be able to define "coping skills". Patient will identify new coping skills.  Patient will increase communication.   Affect/Mood: Appropriate   Participation Level: Active and Engaged   Participation Quality: Independent   Behavior: Appropriate, Calm, and Cooperative   Speech/Thought Process: Coherent   Insight: Good   Judgement: Good   Modes of Intervention: Clarification, Education, Exploration, Guided Discussion, Worksheet, and Writing   Patient Response to Interventions:  Attentive, Engaged, Interested , and Receptive   Education Outcome:  Acknowledges education   Clinical Observations/Individualized Feedback: Antonio [name] was active in their participation of session activities and group discussion. Pt identified "blowing bubbles, baking, cooking, poetry, and music" as coping skills. Pt spontaneously contributed to group discussion while interacting well with LRT and peers duration of session.    Plan: Continue to engage patient in RT group sessions 2-3x/week.   Rosina Lowenstein, LRT, CTRS 11/15/2023 12:13 PM

## 2023-11-15 NOTE — Progress Notes (Signed)
   11/15/23 0000  Psych Admission Type (Psych Patients Only)  Admission Status Voluntary  Psychosocial Assessment  Patient Complaints Depression  Eye Contact Poor  Facial Expression Flat  Affect Depressed;Flat  Speech Soft  Interaction Cautious;Guarded  Motor Activity Slow  Appearance/Hygiene In scrubs  Behavior Characteristics Cooperative;Anxious  Mood Preoccupied  Thought Process  Coherency WDL  Content WDL  Delusions None reported or observed  Perception WDL  Hallucination None reported or observed  Judgment Limited  Confusion None  Danger to Self  Current suicidal ideation? Denies  Agreement Not to Harm Self Yes  Description of Agreement Verbal  Danger to Others  Danger to Others None reported or observed

## 2023-11-15 NOTE — Plan of Care (Signed)
  Problem: Education: Goal: Emotional status will improve Outcome: Progressing   Problem: Education: Goal: Mental status will improve Outcome: Progressing   Problem: Activity: Goal: Interest or engagement in activities will improve Outcome: Progressing   Problem: Coping: Goal: Ability to verbalize frustrations and anger appropriately will improve Outcome: Progressing   Problem: Health Behavior/Discharge Planning: Goal: Identification of resources available to assist in meeting health care needs will improve Outcome: Progressing

## 2023-11-15 NOTE — Plan of Care (Signed)
 Pt new to the unit tonight, hasn't had time to progress  Problem: Education: Goal: Knowledge of [facility] Education information/materials will improve Outcome: Not Progressing Goal: Emotional status will improve Outcome: Not Progressing Goal: Mental status will improve Outcome: Not Progressing Goal: Verbalization of understanding the information provided will improve Outcome: Not Progressing   Problem: Activity: Goal: Interest or engagement in activities will improve Outcome: Not Progressing Goal: Sleeping patterns will improve Outcome: Not Progressing   Problem: Coping: Goal: Ability to verbalize frustrations and anger appropriately will improve Outcome: Not Progressing Goal: Ability to demonstrate self-control will improve Outcome: Not Progressing   Problem: Health Behavior/Discharge Planning: Goal: Identification of resources available to assist in meeting health care needs will improve Outcome: Not Progressing Goal: Compliance with treatment plan for underlying cause of condition will improve Outcome: Not Progressing   Problem: Physical Regulation: Goal: Ability to maintain clinical measurements within normal limits will improve Outcome: Not Progressing   Problem: Safety: Goal: Periods of time without injury will increase Outcome: Not Progressing

## 2023-11-15 NOTE — Group Note (Signed)
 Date:  11/15/2023 Time:  10:02 AM  Group Topic/Focus:   Goals Group:   The focus of this group is to help patients establish daily goals to achieve during treatment and discuss how the patient can incorporate goal setting into their daily lives to aide in recovery.  Overcoming Stress:   The focus of this group is to define stress and help patients assess their triggers.   Participation Level:  Active  Participation Quality:  Appropriate  Affect:  Appropriate  Cognitive:  Appropriate  Insight: Appropriate  Engagement in Group:  Engaged  Modes of Intervention:  Activity, Discussion, and Education  Additional Comments:    Antonio [name] A Antonio [name] 11/15/2023, 10:02 AM

## 2023-11-15 NOTE — Progress Notes (Signed)
   11/15/23 1000  Psych Admission Type (Psych Patients Only)  Admission Status Voluntary  Psychosocial Assessment  Patient Complaints Depression  Eye Contact Brief  Facial Expression Flat  Affect Depressed;Flat  Speech Soft  Interaction Cautious;Guarded  Motor Activity Slow  Appearance/Hygiene In scrubs  Behavior Characteristics Cooperative  Mood Preoccupied  Thought Process  Coherency WDL  Content WDL  Delusions None reported or observed  Perception WDL  Hallucination None reported or observed  Judgment Impaired  Confusion None  Danger to Self  Current suicidal ideation? Denies  Agreement Not to Harm Self Yes  Description of Agreement verbal  Danger to Others  Danger to Others None reported or observed
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
## 2023-11-15 NOTE — H&P (Signed)
 Psychiatric Admission Assessment Adult  Patient Identification: Antonio [name] MRN:  [id number] Date of Evaluation:  11/15/2023 Chief Complaint:  Suicide attempt [facility]) [T14.91XA] Principal Diagnosis: Suicide attempt by cutting of wrist (HCC) Diagnosis:  Principal Problem:   Suicide attempt by cutting of wrist (HCC) Active Problems:   Deliberate self-cutting   MDD (major depressive disorder), recurrent severe, without psychosis (HCC)   Borderline personality disorder (HCC)   Suicide attempt (HCC)  History of Present Illness:  18 year old transgender male with a history of Major Depressive Disorder (MDD), Borderline Personality Disorder (BPD), social anxiety, and deliberate self-harm who presented to the Emergency Department (ED) on 11/14/2023 for a self-inflicted deep laceration to the left forearm requiring sutures.The patient reports that he does not know why he cut himself but believes it was due to impulsivity rather than emotional distress. He denies any altercation, external stressors, or feelings of anger at the time of the act. After injuring himself, he called the police for help, stating that he wanted care.The patient denies active suicidal ideation (SI), homicidal ideation (HI), auditory or visual hallucinations (AVH) at the time of assessment. He also denies feeling anxious or depressed but acknowledges a history of multiple suicide attempts and self-harm behaviors.The patient has been compliant with his outpatient psychiatric treatment, which includes weekly therapy sessions and medication management by a psychiatrist in the community. His current medication regimen includes Buspirone, Aripiprazole, Trazodone, Venlafaxine, and Metformin. He reports no recent changes in medication and has had a positive response to treatment historically.The patient states that he slept poorly the night before admission and took Trazodone to help him fall asleep, which was effective. He denies any substance  use and reports that his parents are supportive.The patient is currently unemployed but plans to pursue higher education. He has been well-groomed and cooperative throughout the evaluation and engaged in meaningful conversation with staff.The patient meets the criteria for inpatient psychiatric admission due to his impulsive self-harm behavior and ongoing risk for further self-injury.   Associated Signs/Symptoms: Depression Symptoms:  anhedonia, insomnia, suicidal thoughts with specific plan, suicidal attempt, loss of energy/fatigue, weight gain, (Hypo) Manic Symptoms:  Impulsivity, Labiality of Mood, Anxiety Symptoms:   none reported Psychotic Symptoms:   none reported PTSD Symptoms: Negative Total Time spent with patient: 2 hours  Past Psychiatric History: Depression SIB  Is the patient at risk to self? Yes.    Has the patient been a risk to self in the past 6 months? Yes.    Has the patient been a risk to self within the distant past? Yes.    Is the patient a risk to others? No.  Has the patient been a risk to others in the past 6 months? No.  Has the patient been a risk to others within the distant past? No.   [place] Scale:  Flowsheet Row Admission (Current) from 11/14/2023 in [facility] INPATIENT BEHAVIORAL MEDICINE Most recent reading at 11/15/2023  3:00 AM ED from 11/14/2023 in [facility] Emergency Department at [facility] Most recent reading at 11/14/2023  1:42 PM Admission (Discharged) from 08/19/2023 in [facility] INPATIENT BEHAVIORAL MEDICINE Most recent reading at 08/20/2023  5:34 AM  C-SSRS RISK CATEGORY High Risk High Risk High Risk        Prior Inpatient Therapy: Yes.   If yes, describe [facility]  Prior Outpatient Therapy: Yes.   If yes, describe Dr Jerold Coombe   Alcohol Screening: 1. How often do you have a drink containing alcohol?: Never 2. How many drinks containing alcohol  do you have on a typical day when you are drinking?: 1 or 2 3. How often do you have six or more  drinks on one occasion?: Never AUDIT-C Score: 0 4. How often during the last year have you found that you were not able to stop drinking once you had started?: Never 5. How often during the last year have you failed to do what was normally expected from you because of drinking?: Never 6. How often during the last year have you needed a first drink in the morning to get yourself going after a heavy drinking session?: Never 7. How often during the last year have you had a feeling of guilt of remorse after drinking?: Never 8. How often during the last year have you been unable to remember what happened the night before because you had been drinking?: Never 9. Have you or someone else been injured as a result of your drinking?: No 10. Has a relative or friend or a doctor or another health worker been concerned about your drinking or suggested you cut down?: No Alcohol Use Disorder Identification Test Final Score (AUDIT): 0 Substance Abuse History in the last 12 months:  No. 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   Asthma    Bupropion overdose 01/16/2020   Complication of anesthesia    gets disoriented and shakes after surgery   Deliberate self-cutting 10/17/2018   Depression    Eczema    Fracture of 5th metatarsal    Gender dysphoria    Intentional overdose of drug in tablet form (HCC) 04/03/2020   MDD (major depressive disorder), recurrent severe, without psychosis (HCC) 11/02/2021   Moderate episode of recurrent major depressive disorder (HCC) 04/24/2020   Multiple allergies    Severe episode of recurrent major depressive disorder, without psychotic features (HCC) 01/02/2020   Suicidal ideations 08/31/2022   Suicide attempt by cutting of wrist (HCC) 07/28/2022     Past Surgical History:  Procedure Laterality Date   SUPPRELIN IMPLANT Left 01/22/2019   Procedure: SUPPRELIN IMPLANT;  Surgeon: Kandice Hams, MD;  Location: [facility];  Service: Pediatrics;  Laterality: Left;   SUPPRELIN REMOVAL N/A 03/03/2020   Procedure: SUPPRELIN REMOVAL;  Surgeon: Kandice Hams, MD;  Location: [facility];  Service: Pediatrics;  Laterality: N/A;   TONSILLECTOMY     TYMPANOSTOMY TUBE PLACEMENT     Family History:  Family History  Problem Relation Age of Onset   Depression Mother    Anxiety disorder Mother    Hypertension Maternal Grandmother    Allergic rhinitis Neg Hx    Angioedema Neg Hx    Atopy Neg Hx    Eczema Neg Hx    Immunodeficiency Neg Hx    Urticaria Neg Hx    Family Psychiatric  History: See above Tobacco Screening:  Social History   Tobacco Use  Smoking Status Never  Smokeless Tobacco Never    BH Tobacco Counseling     Are you interested in Tobacco Cessation Medications?  N/A, patient does not use tobacco products Counseled patient on smoking cessation:  N/A, patient does not use tobacco products Reason Tobacco Screening Not Completed: No value filed.       Social History:  Social History   Substance and Sexual Activity  Alcohol Use No  Social History   Substance and Sexual Activity  Drug Use Not Currently   Types: Marijuana    Additional Social History: Marital status: Single Are you sexually active?: No What is your sexual orientation?: "Bi, I guess." Has your sexual activity been affected by drugs, alcohol, medication, or emotional stress?: N/A Does patient have children?: No                         Allergies:   Allergies  Allergen Reactions   Egg-Derived Products Anaphylaxis   Justicia Adhatoda Anaphylaxis and Other (See Comments)    All "TREE NUTS"   Peanut-Containing Drug Products Anaphylaxis   Lab Results:  Results for orders placed or performed during the  hospital encounter of 11/14/23 (from the past 48 hours)  Rapid urine drug screen (hospital performed)     Status: None   Collection Time: 11/14/23  3:28 PM  Result Value Ref Range   Opiates NONE DETECTED NONE DETECTED   Cocaine NONE DETECTED NONE DETECTED   Benzodiazepines NONE DETECTED NONE DETECTED   Amphetamines NONE DETECTED NONE DETECTED   Tetrahydrocannabinol NONE DETECTED NONE DETECTED   Barbiturates NONE DETECTED NONE DETECTED    Comment: (NOTE) DRUG SCREEN FOR MEDICAL PURPOSES ONLY.  IF CONFIRMATION IS NEEDED FOR ANY PURPOSE, NOTIFY LAB WITHIN 5 DAYS.  LOWEST DETECTABLE LIMITS FOR URINE DRUG SCREEN Drug Class                     Cutoff (ng/mL) Amphetamine and metabolites    1000 Barbiturate and metabolites    200 Benzodiazepine                 200 Opiates and metabolites        300 Cocaine and metabolites        300 THC                            50 Performed at [facility], [address], [place], [place] [address]   Comprehensive metabolic panel     Status: None   Collection Time: 11/14/23  3:40 PM  Result Value Ref Range   Sodium 137 135 - 145 mmol/L   Potassium 4.1 3.5 - 5.1 mmol/L   Chloride 105 98 - 111 mmol/L   CO2 22 22 - 32 mmol/L   Glucose, Bld 78 70 - 99 mg/dL    Comment: Glucose reference range applies only to samples taken after fasting for at least 8 hours.   BUN 13 6 - 20 mg/dL   Creatinine, Ser 6.57 0.61 - 1.24 mg/dL   Calcium 9.3 8.9 - 84.6 mg/dL   Total Protein 8.0 6.5 - 8.1 g/dL   Albumin 4.2 3.5 - 5.0 g/dL   AST 18 15 - 41 U/L   ALT 19 0 - 44 U/L   Alkaline Phosphatase 74 38 - 126 U/L   Total Bilirubin 0.9 0.0 - 1.2 mg/dL   GFR, Estimated >96 >29 mL/min    Comment: (NOTE) Calculated using the CKD-EPI Creatinine Equation (2021)    Anion gap 10 5 - 15    Comment: Performed at [facility], [address], [place], [place] [address]  Ethanol     Status: None   Collection Time: 11/14/23  3:40 PM   Result Value Ref Range   Alcohol, Ethyl (B) <10 <10 mg/dL    Comment: (NOTE) Lowest detectable  limit for serum alcohol is 10 mg/dL.  For medical purposes only. Performed at [facility], [address], [place], [place] [address]   Salicylate level     Status: Abnormal   Collection Time: 11/14/23  3:40 PM  Result Value Ref Range   Salicylate Lvl <7.0 (L) 7.0 - 30.0 mg/dL    Comment: Performed at [facility], [address] [place], [place] [address]  Acetaminophen level     Status: Abnormal   Collection Time: 11/14/23  3:40 PM  Result Value Ref Range   Acetaminophen (Tylenol), Serum <10 (L) 10 - 30 ug/mL    Comment: (NOTE) Therapeutic concentrations vary significantly. A range of 10-30 ug/mL  may be an effective concentration for many patients. However, some  are best treated at concentrations outside of this range. Acetaminophen concentrations >150 ug/mL at 4 hours after ingestion  and >50 ug/mL at 12 hours after ingestion are often associated with  toxic reactions.  Performed at [facility], [address] [place], [place] [address]   cbc     Status: Abnormal   Collection Time: 11/14/23  3:40 PM  Result Value Ref Range   WBC 6.2 4.0 - 10.5 K/uL   RBC 5.54 4.22 - 5.81 MIL/uL   Hemoglobin 13.6 13.0 - 17.0 g/dL   HCT 29.5 62.1 - 30.8 %   MCV 77.3 (L) 80.0 - 100.0 fL   MCH 24.5 (L) 26.0 - 34.0 pg   MCHC 31.8 30.0 - 36.0 g/dL   RDW 65.7 84.6 - 96.2 %   Platelets 296 150 - 400 K/uL   nRBC 0.0 0.0 - 0.2 %    Comment: Performed at [facility], [address] [place], [place] [address]  hCG, serum, qualitative     Status: None   Collection Time: 11/14/23  3:40 PM  Result Value Ref Range   Preg, Serum NEGATIVE NEGATIVE    Comment:        THE SENSITIVITY OF THIS METHODOLOGY IS >10 mIU/mL. Performed at [facility], [address], [place], [place] [address]     Blood  Alcohol level:  Lab Results  Component Value Date   [facility] <10 11/14/2023   ETH <10 08/19/2023    Metabolic Disorder Labs:  Lab Results  Component Value Date   HGBA1C 5.1 12/30/2022   MPG 99.67 12/30/2022   MPG 105 09/01/2022   Lab Results  Component Value Date   PROLACTIN 10.0 09/01/2022   PROLACTIN 10.3 04/16/2022   Lab Results  Component Value Date   CHOL 200 (H) 12/30/2022   TRIG 114 12/30/2022   HDL 40 (L) 12/30/2022   CHOLHDL 5.0 12/30/2022   VLDL 23 12/30/2022   LDLCALC 137 (H) 12/30/2022   LDLCALC 170 (H) 09/01/2022    Current Medications: Current Facility-Administered Medications  Medication Dose Route Frequency Provider Last Rate Last Admin   acetaminophen (TYLENOL) tablet 650 mg  650 mg Oral Q6H PRN Onuoha, Chinwendu V, NP       alum & mag hydroxide-simeth (MAALOX/MYLANTA) 200-200-20 MG/5ML suspension 30 mL  30 mL Oral Q4H PRN Onuoha, Chinwendu V, NP       ARIPiprazole (ABILIFY) tablet 15 mg  15 mg Oral QHS Myriam Forehand, NP       busPIRone (BUSPAR) tablet 10 mg  10 mg Oral BID Myriam Forehand, NP       haloperidol (HALDOL) tablet 5 mg  5 mg Oral TID PRN  Onuoha, Chinwendu V, NP       And   diphenhydrAMINE (BENADRYL) capsule 50 mg  50 mg Oral TID PRN Onuoha, Chinwendu V, NP       haloperidol lactate (HALDOL) injection 5 mg  5 mg Intramuscular TID PRN Onuoha, Chinwendu V, NP       And   diphenhydrAMINE (BENADRYL) injection 50 mg  50 mg Intramuscular TID PRN Onuoha, Chinwendu V, NP       And   LORazepam (ATIVAN) injection 2 mg  2 mg Intramuscular TID PRN Onuoha, Chinwendu V, NP       haloperidol lactate (HALDOL) injection 10 mg  10 mg Intramuscular TID PRN Onuoha, Chinwendu V, NP       And   diphenhydrAMINE (BENADRYL) injection 50 mg  50 mg Intramuscular TID PRN Onuoha, Chinwendu V, NP       And   LORazepam (ATIVAN) injection 2 mg  2 mg Intramuscular TID PRN Onuoha, Chinwendu V, NP       magnesium hydroxide (MILK OF MAGNESIA) suspension 30 mL  30 mL Oral Daily PRN  Onuoha, Chinwendu V, NP       melatonin tablet 5 mg  5 mg Oral QHS Myriam Forehand, NP       [START ON 11/16/2023] metFORMIN (GLUCOPHAGE) tablet 500 mg  500 mg Oral BID WC Myriam Forehand, NP       traZODone (DESYREL) tablet 50 mg  50 mg Oral QHS Myriam Forehand, NP       Melene Muller ON 11/16/2023] venlafaxine XR (EFFEXOR-XR) 24 hr capsule 150 mg  150 mg Oral Q breakfast Myriam Forehand, NP       PTA Medications: Medications Prior to Admission  Medication Sig Dispense Refill Last Dose/Taking   albuterol (VENTOLIN HFA) 108 (90 Base) MCG/ACT inhaler Inhale 1-2 puffs into the lungs every 4 (four) hours as needed for wheezing or shortness of breath. 18 g 1    ARIPiprazole (ABILIFY) 15 MG tablet Take 1 tablet (15 mg total) by mouth at bedtime. (Patient taking differently: Take 15 mg by mouth daily.) 30 tablet 0    betamethasone dipropionate (DIPROLENE) 0.05 % ointment Apply topically 2 (two) times daily. Do not use for more than 2 weeks. (Patient taking differently: Apply 1 application  topically 2 (two) times daily as needed (for redness or inflammation- Do not use for more than 2 weeks).) 45 g 0    busPIRone (BUSPAR) 10 MG tablet Take 1 tablet (10 mg total) by mouth 2 (two) times daily. 60 tablet 1    EPINEPHrine (EPIPEN 2-PAK) 0.3 mg/0.3 mL IJ SOAJ injection Inject 0.3 mg into the muscle as needed for anaphylaxis (AS DIRECTED). 2 each 1    Ibuprofen 200 MG CAPS Take 200-800 mg by mouth every 6 (six) hours as needed (for pain or headaches).      metFORMIN (GLUCOPHAGE) 500 MG tablet TAKE 1 TABLET BY MOUTH 2 TIMES DAILY WITH A MEAL. 180 tablet 1    naltrexone (DEPADE) 50 MG tablet Take 1 tablet (50 mg total) by mouth daily. 90 tablet 1    omeprazole (PRILOSEC) 40 MG capsule Take 40 mg by mouth daily before breakfast.      PATADAY 0.1 % ophthalmic solution Place 1 drop into both eyes 3 (three) times daily as needed for allergies.      propranolol (INDERAL) 10 MG tablet Take 1 tablet (10 mg total) by mouth at bedtime. 30  tablet 0    Testosterone Cypionate  200 MG/ML SOLN Inject weekly under the skin as directed (Patient taking differently: Inject into the skin See admin instructions. Inject every Sunday under the skin as directed) 4 mL 1    traZODone (DESYREL) 50 MG tablet Take 1-2 tablets (50-100 mg total) by mouth at bedtime as needed for sleep. 30 tablet 0    triamcinolone ointment (KENALOG) 0.1 % Apply topically 2 (two) times daily as needed (eczema). 30 g 0    venlafaxine XR (EFFEXOR XR) 150 MG 24 hr capsule Take 1 capsule (150 mg total) by mouth daily with breakfast. 30 capsule 1     Musculoskeletal: Strength & Muscle Tone: within normal limits Gait & Station: normal Patient leans: N/A            Psychiatric Specialty Exam:  Presentation  General Appearance:  Appropriate for Environment (obese male appearing stated age)  Eye Contact: Good (Calm, cooperative, engaged in meaningful conversation.)  Speech: Clear and Coherent; Normal Rate  Speech Volume: Normal  Handedness: Right   Mood and Affect  Mood: -- (Neutral)  Affect: Flat; Blunt   Thought Process  Thought Processes: Coherent  Duration of Psychotic Symptoms: Depressive Symptoms 4 weeks Past Diagnosis of Schizophrenia or Psychoactive disorder: No  Descriptions of Associations:Intact  Orientation:Full (Time, Place and Person)  Thought Content:WDL  Hallucinations:Hallucinations: None  Ideas of Reference:None  Suicidal Thoughts:Suicidal Thoughts: No (Impulse Control: Poor; engaged in self-harm impulsively.)  Homicidal Thoughts:Homicidal Thoughts: No   Sensorium  Memory: Immediate Good; Recent Good; Remote Good  Judgment: Poor (into self-harm behavior; understands need for treatment.)  Insight: Poor   Executive Functions  Concentration: Fair  Attention Span: Fair  Recall: Good  Fund of Knowledge: Good  Language: Good   Psychomotor Activity  Psychomotor Activity: Psychomotor  Activity: Normal   Assets  Assets: Communication Skills; Financial Resources/Insurance; Social Support   Sleep  Sleep: Sleep: Good Number of Hours of Sleep: 6    Physical Exam: Physical Exam Vitals and nursing note reviewed.  Constitutional:      Appearance: Normal appearance.  HENT:     Head: Normocephalic and atraumatic.     Nose: Nose normal.  Pulmonary:     Effort: Pulmonary effort is normal.  Musculoskeletal:        General: Normal range of motion.     Cervical back: Normal range of motion.  Skin:    Comments: Bilateral arm sutures  Neurological:     General: No focal deficit present.     Mental Status: He is alert and oriented to person, place, and time. Mental status is at baseline.  Psychiatric:        Attention and Perception: Attention and perception normal.        Mood and Affect: Mood is depressed. Affect is blunt and flat.        Speech: Speech normal.        Behavior: Behavior normal. Behavior is cooperative.        Thought Content: Thought content normal.        Cognition and Memory: Cognition and memory normal.        Judgment: Judgment is impulsive.    Review of Systems  Skin:        Bilateral arm sutures  Psychiatric/Behavioral:  Positive for depression. The patient is nervous/anxious.   All other systems reviewed and are negative.  Blood pressure 119/79, pulse 97, temperature 98.5 F (36.9 C), temperature source Oral, resp. rate 16, height 5\' 5"  (1.651 m), weight 103.4 kg,  SpO2 98%. Body mass index is 37.93 kg/m.  Treatment Plan Summary: Daily contact with patient to assess and evaluate symptoms and progress in treatment and Medication management Buspirone (Buspar) 10mg  BID - Anxiolytic for Generalized Anxiety Disorder and social anxiety. Aripiprazole (Abilify) 15mg  nightly - Atypical antipsychotic for mood stabilization and impulse control. Trazodone 50mg  nightly - Serotonin antagonist and reuptake inhibitor (SARI) for  insomnia. Venlafaxine (Effexor) 150mg  in the AM - SNRI for depression, anxiety, and emotional regulation. Metformin 500mg  - Used for metabolic support, often prescribed for weight management or insulin resistance. Monitor every 15 minutes for self-injurious behavior (SIB) and suicide risk. Implement safety planning. oordinate with outpatient psychiatrist and therapist to ensure continuity of care post-discharge. Consider DBT-based outpatient therapy for emotional regulation and impulsivity management. Wound care Coordinator for assessment bilateral arms self inflicted wounds Observation Level/Precautions:  Continuous Observation 15 minute checks  Laboratory:  Vitamin D  Psychotherapy:    Medications:    Consultations:    Discharge Concerns:    Estimated LOS:  Other:     Physician Treatment Plan for Primary Diagnosis: Suicide attempt by cutting of wrist (HCC) Long Term Goal(s): Improvement in symptoms so as ready for discharge  Short Term Goals: Ability to identify changes in lifestyle to reduce recurrence of condition will improve, Ability to verbalize feelings will improve, Ability to disclose and discuss suicidal ideas, Ability to demonstrate self-control will improve, Ability to identify and develop effective coping behaviors will improve, Ability to maintain clinical measurements within normal limits will improve, Compliance with prescribed medications will improve, and Ability to identify triggers associated with substance abuse/mental health issues will improve  Physician Treatment Plan for Secondary Diagnosis: Principal Problem:   Suicide attempt by cutting of wrist (HCC) Active Problems:   Deliberate self-cutting   MDD (major depressive disorder), recurrent severe, without psychosis (HCC)   Borderline personality disorder (HCC)   Suicide attempt (HCC)  Long Term Goal(s): Improvement in symptoms so as ready for discharge  Short Term Goals: Ability to identify changes in  lifestyle to reduce recurrence of condition will improve, Ability to verbalize feelings will improve, Ability to disclose and discuss suicidal ideas, Ability to demonstrate self-control will improve, Ability to identify and develop effective coping behaviors will improve, Ability to maintain clinical measurements within normal limits will improve, Compliance with prescribed medications will improve, and Ability to identify triggers associated with substance abuse/mental health issues will improve  I certify that inpatient services furnished can reasonably be expected to improve the patient's condition.    Myriam Forehand, NP 3/4/20255:31 PM

## 2023-11-15 NOTE — B[facility] Suicide Risk Assessment (Signed)
 [facility] Admission Suicide Risk Assessment   Nursing information obtained from:  Patient Demographic factors:  Gay, lesbian, or bisexual orientation Current Mental Status:  Suicidal ideation indicated by patient Loss Factors:  NA Historical Factors:  Impulsivity Risk Reduction Factors:  Positive social support, Positive therapeutic relationship  Total Time spent with patient: 2 hours Principal Problem: Suicide attempt by cutting of wrist (HCC) Diagnosis:  Principal Problem:   Suicide attempt by cutting of wrist (HCC) Active Problems:   Deliberate self-cutting   MDD (major depressive disorder), recurrent severe, without psychosis (HCC)   Borderline personality disorder (HCC)   Suicide attempt (HCC)  Subjective Data: 18 year old transgender male, was brought to the Emergency Department by police after sustaining a self-inflicted deep cut to the left forearm. The patient reports that he does not know why he cut himself but believes it was due to impulse control issues. He denies any recent conflicts, altercations, or feelings of anger towards himself or others. He states that after the act, he called the police himself because he wanted care. The patient denies suicidal ideation (SI), homicidal ideation (HI), auditory or visual hallucinations (AVH) at the time of assessment. He denies feeling anxious or depressed, though he has a history of multiple suicide attempts and deliberate self-harm. The patient reports that he sees a therapist weekly and has a psychiatrist managing his medications in the community. He reports poor sleep the night before admission, stating that he had to take his sleep medication (Trazodone), which helped him sleep. He denies substance use and states that his parents are supportive.  Continued Clinical Symptoms:  Alcohol Use Disorder Identification Test Final Score (AUDIT): 0 The "Alcohol Use Disorders Identification Test", Guidelines for Use in Primary Care, Second Edition.   World Science writer [facility]). Score between 0-7:  no or low risk or alcohol related problems. Score between 8-15:  moderate risk of alcohol related problems. Score between 16-19:  high risk of alcohol related problems. Score 20 or above:  warrants further diagnostic evaluation for alcohol dependence and treatment.   CLINICAL FACTORS:   Bipolar Disorder:   Bipolar II Depression:   Anhedonia Impulsivity Insomnia Personality Disorders:   Cluster B Unstable or Poor Therapeutic Relationship   Musculoskeletal: Strength & Muscle Tone: within normal limits Gait & Station: normal Patient leans: N/A  Psychiatric Specialty Exam:  Presentation  General Appearance:  Appropriate for Environment (obese male appearing stated age)  Eye Contact: Good (Calm, cooperative, engaged in meaningful conversation.)  Speech: Clear and Coherent; Normal Rate  Speech Volume: Normal  Handedness: Right   Mood and Affect  Mood: -- (Neutral)  Affect: Flat; Blunt   Thought Process  Thought Processes: Coherent  Descriptions of Associations:Intact  Orientation:Full (Time, Place and Person)  Thought Content:WDL  History of Schizophrenia/Schizoaffective disorder:No  Duration of Psychotic Symptoms:N/A  Hallucinations:Hallucinations: None  Ideas of Reference:None  Suicidal Thoughts:Suicidal Thoughts: No (Impulse Control: Poor; engaged in self-harm impulsively.)  Homicidal Thoughts:Homicidal Thoughts: No   Sensorium  Memory: Immediate Good; Recent Good; Remote Good  Judgment: Poor (into self-harm behavior; understands need for treatment.)  Insight: Poor   Executive Functions  Concentration: Fair  Attention Span: Fair  Recall: Good  Fund of Knowledge: Good  Language: Good   Psychomotor Activity  Psychomotor Activity:Psychomotor Activity: Normal   Assets  Assets: Communication Skills; Financial Resources/Insurance; Social Support   Sleep   Sleep:Sleep: Good Number of Hours of Sleep: 6    Physical Exam: Physical Exam Vitals reviewed.  Constitutional:  Appearance: Normal appearance.  HENT:     Head: Normocephalic and atraumatic.     Nose: Nose normal.  Pulmonary:     Effort: Pulmonary effort is normal.  Musculoskeletal:        General: Normal range of motion.     Cervical back: Normal range of motion.  Skin:    Comments: Sutures in bilateral arms  Neurological:     General: No focal deficit present.     Mental Status: He is alert. Mental status is at baseline.  Psychiatric:        Attention and Perception: Attention and perception normal.        Mood and Affect: Mood is anxious and depressed. Affect is blunt and flat.        Speech: Speech normal.        Behavior: Behavior normal. Behavior is cooperative.        Thought Content: Thought content normal.        Cognition and Memory: Cognition and memory normal.        Judgment: Judgment is impulsive.    Review of Systems  Skin:        Sutures in bilateral arms  Psychiatric/Behavioral:  Positive for depression. The patient is nervous/anxious.    Blood pressure 119/79, pulse 97, temperature 98.5 F (36.9 C), temperature source Oral, resp. rate 16, height 5\' 5"  (1.651 m), weight 103.4 kg, SpO2 98%. Body mass index is 37.93 kg/m.   COGNITIVE FEATURES THAT CONTRIBUTE TO RISK:  None    SUICIDE RISK:   Mild:  Suicidal ideation of limited frequency, intensity, duration, and specificity.  There are no identifiable plans, no associated intent, mild dysphoria and related symptoms, good self-control (both objective and subjective assessment), few other risk factors, and identifiable protective factors, including available and accessible social support.  PLAN OF CARE:  Buspirone (Buspar) 10mg  BID - Anxiolytic for Generalized Anxiety Disorder and social anxiety. Aripiprazole (Abilify) 15mg  nightly - Atypical antipsychotic for mood stabilization and impulse  control. Trazodone 50mg  nightly - Serotonin antagonist and reuptake inhibitor (SARI) for insomnia. Venlafaxine (Effexor) 150mg  in the AM - SNRI for depression, anxiety, and emotional regulation. Metformin 500mg  - Used for metabolic support, often prescribed for weight management or insulin resistance. Monitor every 15 minutes for self-injurious behavior (SIB) and suicide risk. Implement safety planning. oordinate with outpatient psychiatrist and therapist to ensure continuity of care post-discharge. Consider DBT-based outpatient therapy for emotional regulation and impulsivity management. Wound care Coordinator for assessment bilateral arms self inflicted wounds  I certify that inpatient services furnished can reasonably be expected to improve the patient's condition.   Myriam Forehand, NP 11/15/2023, 5:08 PM

## 2023-11-15 NOTE — Group Note (Signed)
 [facility] LCSW Group Therapy Note   Group Date: 11/15/2023 Start Time: 1330 End Time: 1430  Type of Therapy/Topic:  Group Therapy:  Feelings about Diagnosis  Participation Level:  Active   Description of Group:    This group will allow patients to explore their thoughts and feelings about diagnoses they have received. Patients will be guided to explore their level of understanding and acceptance of these diagnoses. Facilitator will encourage patients to process their thoughts and feelings about the reactions of others to their diagnosis, and will guide patients in identifying ways to discuss their diagnosis with significant others in their lives. This group will be process-oriented, with patients participating in exploration of their own experiences as well as giving and receiving support and challenge from other group members.   Therapeutic Goals: 1. Patient will demonstrate understanding of diagnosis as evidence by identifying two or more symptoms of the disorder:  2. Patient will be able to express two feelings regarding the diagnosis 3. Patient will demonstrate ability to communicate their needs through discussion and/or role plays  Summary of Patient Progress: Patient was engaged and active in group. Patient was supportive of other group members.  Patient shared the his thoughts on the differences between a mental health diagnosis and a physical diagnosis.  Patient discussed the stigma attached to mental illness.  Patient shared how knowing his diagnosis has added to his life.  He displayed fair insight.   Therapeutic Modalities:   Cognitive Behavioral Therapy Brief Therapy Feelings Identification    Harden Mo, LCSW

## 2023-11-15 NOTE — Group Note (Signed)
 Date:  11/15/2023 Time:  4:44 PM  Group Topic/Focus:  Wellness Toolbox:   The focus of this group is to discuss various aspects of wellness, balancing those aspects and exploring ways to increase the ability to experience wellness.  Patients will create a wellness toolbox for use upon discharge.    Participation Level:  Active  Participation Quality:  Appropriate  Affect:  Appropriate  Cognitive:  Appropriate  Insight: Appropriate  Engagement in Group:  Engaged  Modes of Intervention:  Activity  Additional Comments:    Wilford Corner 11/15/2023, 4:44 PM

## 2023-11-15 NOTE — Group Note (Signed)
 Date:  11/15/2023 Time:  8:39 PM  Group Topic/Focus:  Wrap-Up Group:   The focus of this group is to help patients review their daily goal of treatment and discuss progress on daily workbooks.    Participation Level:  Active  Participation Quality:  Appropriate and Attentive  Affect:  Appropriate  Cognitive:  Alert and Appropriate  Insight: Appropriate  Engagement in Group:  Limited  Modes of Intervention:  Discussion, Education, and Orientation  Additional Comments:     Maglione,Caris Cerveny E 11/15/2023, 8:39 PM

## 2023-11-15 NOTE — Progress Notes (Signed)
 Patient presents with sad, flat affect. Denies SI, HI, AVH. Visible in milieu. Guarded, minimal interaction with staff and peers. Able to voice concerns. Meds given for sleep. Encouragement and support provided. Safety checks maintained. Medications given as prescribed. Pt receptive and remains safe on unit with q 15 min checks.

## 2023-11-16 DIAGNOSIS — F332 Major depressive disorder, recurrent severe without psychotic features: Secondary | ICD-10-CM | POA: Diagnosis not present

## 2023-11-16 MED ORDER — PANTOPRAZOLE SODIUM 40 MG PO TBEC
40.0000 mg | DELAYED_RELEASE_TABLET | Freq: Every day | ORAL | Status: DC
Start: 2023-11-16 — End: 2023-11-20
  Administered 2023-11-16 – 2023-11-20 (×5): 40 mg via ORAL
  Filled 2023-11-16 (×5): qty 1

## 2023-11-16 NOTE — Consult Note (Addendum)
 WOC Nurse Consult Note:  Patient with self inflicted wound to L lower arm, sutured in ER 11/14/2023; per their note 10 cm long and 10 sutures placed  Reason for Consult: arms with sutures  Wound type: full thickness r/t trauma that has been sutured closed  Pressure Injury POA: NA  Measurement: 10 cm long per ED note  Wound bed: per nursing flowsheet; sutured wound  Drainage (amount, consistency, odor)  Periwound: Dressing procedure/placement/frequency: Cleanse L arm sutured wound with NS, apply Xeroform gauze [place] [phone] daily to wound, cover with silicone foam or gauze and Kerlix roll gauze to secure.    POC discussed with bedside nurse. WOC team will not follow.  Re-consult if further needs arise.   Thank you,    Priscella Mann MSN, RN-BC, [employer] [phone]

## 2023-11-16 NOTE — BH IP Treatment Plan (Signed)
 Interdisciplinary Treatment and Diagnostic Plan Update  11/16/2023 Time of Session: 1:49 PM TALTON DELPRIORE MRN: [id number]  Principal Diagnosis: Suicide attempt by cutting of wrist [facility])  Secondary Diagnoses: Principal Problem:   Suicide attempt by cutting of wrist (HCC) Active Problems:   Deliberate self-cutting   MDD (major depressive disorder), recurrent severe, without psychosis (HCC)   Borderline personality disorder (HCC)   Suicide attempt (HCC)   Current Medications:  Current Facility-Administered Medications  Medication Dose Route Frequency Provider Last Rate Last Admin   acetaminophen (TYLENOL) tablet 650 mg  650 mg Oral Q6H PRN Onuoha, Chinwendu V, NP       alum & mag hydroxide-simeth (MAALOX/MYLANTA) 200-200-20 MG/5ML suspension 30 mL  30 mL Oral Q4H PRN Onuoha, Chinwendu V, NP       ARIPiprazole (ABILIFY) tablet 15 mg  15 mg Oral QHS Myriam Forehand, NP   15 mg at 11/15/23 2115   busPIRone (BUSPAR) tablet 10 mg  10 mg Oral BID Myriam Forehand, NP   10 mg at 11/16/23 0830   haloperidol (HALDOL) tablet 5 mg  5 mg Oral TID PRN Onuoha, Chinwendu V, NP       And   diphenhydrAMINE (BENADRYL) capsule 50 mg  50 mg Oral TID PRN Onuoha, Chinwendu V, NP       haloperidol lactate (HALDOL) injection 5 mg  5 mg Intramuscular TID PRN Onuoha, Chinwendu V, NP       And   diphenhydrAMINE (BENADRYL) injection 50 mg  50 mg Intramuscular TID PRN Onuoha, Chinwendu V, NP       And   LORazepam (ATIVAN) injection 2 mg  2 mg Intramuscular TID PRN Onuoha, Chinwendu V, NP       haloperidol lactate (HALDOL) injection 10 mg  10 mg Intramuscular TID PRN Onuoha, Chinwendu V, NP       And   diphenhydrAMINE (BENADRYL) injection 50 mg  50 mg Intramuscular TID PRN Onuoha, Chinwendu V, NP       And   LORazepam (ATIVAN) injection 2 mg  2 mg Intramuscular TID PRN Onuoha, Chinwendu V, NP       magnesium hydroxide (MILK OF MAGNESIA) suspension 30 mL  30 mL Oral Daily PRN Onuoha, Chinwendu V, NP       melatonin  tablet 5 mg  5 mg Oral QHS Myriam Forehand, NP   5 mg at 11/15/23 2116   metFORMIN (GLUCOPHAGE) tablet 500 mg  500 mg Oral BID WC Myriam Forehand, NP   500 mg at 11/16/23 0830   traZODone (DESYREL) tablet 50 mg  50 mg Oral QHS Myriam Forehand, NP   50 mg at 11/15/23 2115   venlafaxine XR (EFFEXOR-XR) 24 hr capsule 150 mg  150 mg Oral Q breakfast Myriam Forehand, NP   150 mg at 11/16/23 0830   PTA Medications: Medications Prior to Admission  Medication Sig Dispense Refill Last Dose/Taking   albuterol (VENTOLIN HFA) 108 (90 Base) MCG/ACT inhaler Inhale 1-2 puffs into the lungs every 4 (four) hours as needed for wheezing or shortness of breath. 18 g 1    ARIPiprazole (ABILIFY) 15 MG tablet Take 1 tablet (15 mg total) by mouth at bedtime. (Patient taking differently: Take 15 mg by mouth daily.) 30 tablet 0    betamethasone dipropionate (DIPROLENE) 0.05 % ointment Apply topically 2 (two) times daily. Do not use for more than 2 weeks. (Patient taking differently: Apply 1 application  topically 2 (two) times daily as needed (  for redness or inflammation- Do not use for more than 2 weeks).) 45 g 0    busPIRone (BUSPAR) 10 MG tablet Take 1 tablet (10 mg total) by mouth 2 (two) times daily. 60 tablet 1    EPINEPHrine (EPIPEN 2-PAK) 0.3 mg/0.3 mL IJ SOAJ injection Inject 0.3 mg into the muscle as needed for anaphylaxis (AS DIRECTED). 2 each 1    Ibuprofen 200 MG CAPS Take 200-800 mg by mouth every 6 (six) hours as needed (for pain or headaches).      metFORMIN (GLUCOPHAGE) 500 MG tablet TAKE 1 TABLET BY MOUTH 2 TIMES DAILY WITH A MEAL. 180 tablet 1    naltrexone (DEPADE) 50 MG tablet Take 1 tablet (50 mg total) by mouth daily. 90 tablet 1    omeprazole (PRILOSEC) 40 MG capsule Take 40 mg by mouth daily before breakfast.      PATADAY 0.1 % ophthalmic solution Place 1 drop into both eyes 3 (three) times daily as needed for allergies.      propranolol (INDERAL) 10 MG tablet Take 1 tablet (10 mg total) by mouth at  bedtime. 30 tablet 0    Testosterone Cypionate 200 MG/ML SOLN Inject weekly under the skin as directed (Patient taking differently: Inject into the skin See admin instructions. Inject every Sunday under the skin as directed) 4 mL 1    traZODone (DESYREL) 50 MG tablet Take 1-2 tablets (50-100 mg total) by mouth at bedtime as needed for sleep. 30 tablet 0    triamcinolone ointment (KENALOG) 0.1 % Apply topically 2 (two) times daily as needed (eczema). 30 g 0    venlafaxine XR (EFFEXOR XR) 150 MG 24 hr capsule Take 1 capsule (150 mg total) by mouth daily with breakfast. 30 capsule 1     Patient Stressors: Financial difficulties   Medication change or noncompliance    Patient Strengths: Ability for insight  Motivation for treatment/growth   Treatment Modalities: Medication Management, Group therapy, Case management,  1 to 1 session with clinician, Psychoeducation, Recreational therapy.   Physician Treatment Plan for Primary Diagnosis: Suicide attempt by cutting of wrist [facility]) Long Term Goal(s): Improvement in symptoms so as ready for discharge   Short Term Goals: Ability to identify changes in lifestyle to reduce recurrence of condition will improve Ability to verbalize feelings will improve Ability to disclose and discuss suicidal ideas Ability to demonstrate self-control will improve Ability to identify and develop effective coping behaviors will improve Ability to maintain clinical measurements within normal limits will improve Compliance with prescribed medications will improve Ability to identify triggers associated with substance abuse/mental health issues will improve  Medication Management: Evaluate patient's response, side effects, and tolerance of medication regimen.  Therapeutic Interventions: 1 to 1 sessions, Unit Group sessions and Medication administration.  Evaluation of Outcomes: Not Met  Physician Treatment Plan for Secondary Diagnosis: Principal Problem:   Suicide  attempt by cutting of wrist (HCC) Active Problems:   Deliberate self-cutting   MDD (major depressive disorder), recurrent severe, without psychosis (HCC)   Borderline personality disorder (HCC)   Suicide attempt (HCC)  Long Term Goal(s): Improvement in symptoms so as ready for discharge   Short Term Goals: Ability to identify changes in lifestyle to reduce recurrence of condition will improve Ability to verbalize feelings will improve Ability to disclose and discuss suicidal ideas Ability to demonstrate self-control will improve Ability to identify and develop effective coping behaviors will improve Ability to maintain clinical measurements within normal limits will improve Compliance with prescribed  medications will improve Ability to identify triggers associated with substance abuse/mental health issues will improve     Medication Management: Evaluate patient's response, side effects, and tolerance of medication regimen.  Therapeutic Interventions: 1 to 1 sessions, Unit Group sessions and Medication administration.  Evaluation of Outcomes: Not Met   RN Treatment Plan for Primary Diagnosis: Suicide attempt by cutting of wrist (HCC) Long Term Goal(s): Knowledge of disease and therapeutic regimen to maintain health will improve  Short Term Goals: Ability to remain free from injury will improve, Ability to verbalize frustration and anger appropriately will improve, Ability to demonstrate self-control, Ability to participate in decision making will improve, Ability to verbalize feelings will improve, Ability to disclose and discuss suicidal ideas, Ability to identify and develop effective coping behaviors will improve, and Compliance with prescribed medications will improve  Medication Management: RN will administer medications as ordered by provider, will assess and evaluate patient's response and provide education to patient for prescribed medication. RN will report any adverse and/or  side effects to prescribing provider.  Therapeutic Interventions: 1 on 1 counseling sessions, Psychoeducation, Medication administration, Evaluate responses to treatment, Monitor vital signs and CBGs as ordered, Perform/monitor CIWA, COWS, AIMS and Fall Risk screenings as ordered, Perform wound care treatments as ordered.  Evaluation of Outcomes: Not Met   LCSW Treatment Plan for Primary Diagnosis: Suicide attempt by cutting of wrist [facility]) Long Term Goal(s): Safe transition to appropriate next level of care at discharge, Engage patient in therapeutic group addressing interpersonal concerns.  Short Term Goals: Engage patient in aftercare planning with referrals and resources, Increase social support, Increase ability to appropriately verbalize feelings, Increase emotional regulation, Facilitate acceptance of mental health diagnosis and concerns, Facilitate patient progression through stages of change regarding substance use diagnoses and concerns, Identify triggers associated with mental health/substance abuse issues, and Increase skills for wellness and recovery  Therapeutic Interventions: Assess for all discharge needs, 1 to 1 time with Social worker, Explore available resources and support systems, Assess for adequacy in community support network, Educate family and significant other(s) on suicide prevention, Complete Psychosocial Assessment, Interpersonal group therapy.  Evaluation of Outcomes: Not Met   Progress in Treatment: Attending groups: Yes. Participating in groups: Yes. Taking medication as prescribed: Yes. Toleration medication: Yes. Family/Significant other contact made: Yes, individual(s) contacted:  Education Completed; Theressa Noble/mother [phone]), has been identified by the patient as the family member/significant other with whom the patient will be residing, and identified as the person(s) who will aid the patient in the event of a mental health crisis (suicidal  ideations/suicide attempt).  With written consent from the patient, the family member/significant other has been provided the following suicide prevention education, prior to the and/or following the discharge of the patient. Patient understands diagnosis: Yes. Discussing patient identified problems/goals with staff: Yes. Medical problems stabilized or resolved: Yes. Denies suicidal/homicidal ideation: Yes. Issues/concerns per patient self-inventory: No. Other: None  New problem(s) identified: No, Describe:  None  New Short Term/Long Term Goal(s):detox, elimination of symptoms of psychosis, medication management for mood stabilization; elimination of SI thoughts; development of comprehensive mental wellness/sobriety plan.    Patient Goals: "Build a routine again and get some structure."    Discharge Plan or Barriers: CSW to assist in the development of appropriate discharge plan.   Reason for Continuation of Hospitalization: Anxiety Depression Suicidal ideation  Estimated Length of Stay: 1-7 days.   Last 3 [place] Suicide Severity Risk Score: Flowsheet Row Admission (Current) from 11/14/2023 in [facility] INPATIENT BEHAVIORAL MEDICINE  Most recent reading at 11/15/2023  3:00 AM ED from 11/14/2023 in [facility] Emergency Department at [facility] Most recent reading at 11/14/2023  1:42 PM Admission (Discharged) from 08/19/2023 in [facility] INPATIENT BEHAVIORAL MEDICINE Most recent reading at 08/20/2023  5:34 AM  C-SSRS RISK CATEGORY High Risk High Risk High Risk       Last PHQ 2/9 Scores:    10/12/2023    8:35 AM 08/31/2023    8:44 AM 05/25/2023    9:27 AM  Depression screen PHQ 2/9  Decreased Interest 0 1 1  Down, Depressed, Hopeless 0 1 0  PHQ - 2 Score 0 2 1  Altered sleeping 3 3 3   Tired, decreased energy 3 3 3   Change in appetite 1 2 1   Feeling bad or failure about yourself  1 1 1   Trouble concentrating 0 1 1  Moving slowly or fidgety/restless 0 0 0  Suicidal thoughts 0 1 1   PHQ-9 Score 8 13 11   Difficult doing work/chores   Somewhat difficult    Scribe for Treatment Team: Lowry Ram, LCSW 11/16/2023 2:35 PM

## 2023-11-16 NOTE — [facility] Suicide Risk Assessment (Signed)
 [facility] INPATIENT:  Family/Significant Other Suicide Prevention Education  Suicide Prevention Education:  Education Completed; Theressa Noble/mother [phone]), has been identified by the patient as the family member/significant other with whom the patient will be residing, and identified as the person(s) who will aid the patient in the event of a mental health crisis (suicidal ideations/suicide attempt).  With written consent from the patient, the family member/significant other has been provided the following suicide prevention education, prior to the and/or following the discharge of the patient.  The suicide prevention education provided includes the following: Suicide risk factors Suicide prevention and interventions National Suicide Hotline telephone number [facility] assessment telephone number [facility] Emergency Assistance 911 [facility] and/or Residential Mobile Crisis Unit telephone number  Request made of family/significant other to: Remove weapons (e.g., guns, rifles, knives), all items previously/currently identified as safety concern.   Remove drugs/medications (over-the-counter, prescriptions, illicit drugs), all items previously/currently identified as a safety concern.  The family member/significant other verbalizes understanding of the suicide prevention education information provided.  The family member/significant other agrees to remove the items of safety concern listed above.  Mother shared that per her understanding pt has been having depressed thoughts, low days. She stated that she believes that he uses self-harm, impulsively as a way to deal with stress. Noble shared that she believes that pt is a danger to himself and it frightened her that he had used a knife to cut himself this time because he would usually take a razor out of a pencil sharpener. Mother did share that all sharps within the house have been locked away.   Glenis Smoker 11/16/2023, 9:35 AM

## 2023-11-16 NOTE — Progress Notes (Addendum)
 [facility] MD Progress Note  11/16/2023 8:31 PM Antonio [name]  MRN:  [id number] Subjective:  18 year old transgender male,initially requested long-term mental health care but later changed his mind during the Interdisciplinary Team (IDT) meeting and opted to remain voluntary in care. 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   Asthma    Bupropion overdose 01/16/2020   Complication of anesthesia    gets disoriented and shakes after surgery   Deliberate self-cutting 10/17/2018   Depression    Eczema    Fracture of 5th metatarsal    Gender dysphoria    Intentional overdose of drug in tablet form (HCC) 04/03/2020   MDD (major depressive disorder), recurrent severe, without psychosis (HCC) 11/02/2021   Moderate episode of recurrent major depressive disorder (HCC) 04/24/2020   Multiple allergies    Severe episode of recurrent major depressive disorder, without psychotic features (HCC) 01/02/2020   Suicidal ideations 08/31/2022   Suicide attempt by cutting of wrist (HCC) 07/28/2022    Past Surgical History:  Procedure Laterality Date   SUPPRELIN IMPLANT Left 01/22/2019   Procedure: SUPPRELIN IMPLANT;  Surgeon: Kandice Hams, MD;  Location: [facility];  Service: Pediatrics;  Laterality: Left;   SUPPRELIN REMOVAL N/A 03/03/2020   Procedure: SUPPRELIN REMOVAL;  Surgeon: Kandice Hams, MD;  Location: [facility];  Service:  Pediatrics;  Laterality: N/A;   TONSILLECTOMY     TYMPANOSTOMY TUBE PLACEMENT     Family History:  Family History  Problem Relation Age of Onset   Depression Mother    Anxiety disorder Mother    Hypertension Maternal Grandmother    Allergic rhinitis Neg Hx    Angioedema Neg Hx    Atopy Neg Hx    Eczema Neg Hx    Immunodeficiency Neg Hx    Urticaria Neg Hx    Family Psychiatric  History: see above Social History:  Social History   Substance and Sexual Activity  Alcohol Use No     Social History   Substance and Sexual Activity  Drug Use Not Currently   Types: Marijuana    Social History   Socioeconomic History   Marital status: Single    Spouse name: Not on file   Number of children: Not on file   Years of education: Not on file   Highest education level: 12th grade  Occupational History   Not on file  Tobacco Use   Smoking status: Never   Smokeless tobacco: Never  Vaping Use   Vaping status: Former   Substances: Nicotine, Flavoring   Devices: Nicotine  Substance and Sexual Activity   Alcohol use: No   Drug use: Not Currently    Types: Marijuana   Sexual activity: Not Currently  Other Topics Concern   Not on file  Social History Narrative   Lives with mom and stepdad.      He is in 12th grade at [employer] 23-24 school year      He enjoys writing and listen to music,  writes poetry   Social Drivers of Corporate investment banker Strain: Patient Declined (08/29/2023)   Overall Financial Resource Strain (CARDIA)    Difficulty of Paying Living Expenses: Patient declined  Food Insecurity: No Food Insecurity (11/15/2023)   Hunger Vital Sign    Worried About Running Out of Food in the Last Year: Never true    Ran Out of Food in the Last Year: Never true  Transportation Needs: No Transportation Needs (11/15/2023)   PRAPARE - Administrator, Civil Service (Medical): No    Lack of Transportation (Non-Medical): No  Recent Concern:  Transportation Needs - Unmet Transportation Needs (08/29/2023)   PRAPARE - Transportation    Lack of Transportation (Medical): Yes    Lack of Transportation (Non-Medical): Yes  Physical Activity: Unknown (08/29/2023)   Exercise Vital Sign    Days of Exercise per Week: 0 days    Minutes of Exercise per Session: Not on file  Stress: Stress Concern Present (08/29/2023)   [employer] of Occupational Health - Occupational Stress Questionnaire    Feeling of Stress : Very much  Social Connections: Socially Isolated (08/29/2023)   Social Connection and Isolation Panel [NHANES]    Frequency of Communication with Friends and Family: Once a week    Frequency of Social Gatherings with Friends and Family: Never    Attends Religious Services: Never    Database administrator or Organizations: No    Attends Engineer, structural: Not on file    Marital Status: Never married   Additional Social History:                         Sleep: Good  Appetite:  Good  Current Medications: Current Facility-Administered Medications  Medication Dose Route Frequency Provider Last Rate Last Admin   acetaminophen (TYLENOL) tablet 650 mg  650 mg Oral Q6H PRN Onuoha, Chinwendu V, NP       alum & mag hydroxide-simeth (MAALOX/MYLANTA) 200-200-20 MG/5ML suspension 30 mL  30 mL Oral Q4H PRN Onuoha, Chinwendu V, NP       ARIPiprazole (ABILIFY) tablet 15 mg  15 mg Oral QHS Myriam Forehand, NP   15 mg at 11/15/23 2115   busPIRone (BUSPAR) tablet 10 mg  10 mg Oral BID Myriam Forehand, NP   10 mg at 11/16/23 1702   haloperidol (HALDOL) tablet 5 mg  5 mg Oral TID PRN Onuoha, Chinwendu V, NP       And   diphenhydrAMINE (BENADRYL) capsule 50 mg  50 mg Oral TID PRN Onuoha, Chinwendu V, NP       haloperidol lactate (HALDOL) injection 5 mg  5 mg Intramuscular TID PRN Onuoha, Chinwendu V, NP       And   diphenhydrAMINE (BENADRYL) injection 50 mg  50 mg Intramuscular TID PRN Onuoha, Chinwendu V, NP       And    LORazepam (ATIVAN) injection 2 mg  2 mg Intramuscular TID PRN Onuoha, Chinwendu V, NP       haloperidol lactate (HALDOL) injection 10 mg  10 mg Intramuscular TID PRN Onuoha, Chinwendu V, NP       And   diphenhydrAMINE (BENADRYL) injection 50 mg  50 mg Intramuscular TID PRN Onuoha, Chinwendu V, NP       And   LORazepam (ATIVAN) injection 2 mg  2 mg Intramuscular TID PRN Onuoha, Chinwendu V, NP       magnesium hydroxide (MILK OF  MAGNESIA) suspension 30 mL  30 mL Oral Daily PRN Onuoha, Chinwendu V, NP       melatonin tablet 5 mg  5 mg Oral QHS Myriam Forehand, NP   5 mg at 11/15/23 2116   metFORMIN (GLUCOPHAGE) tablet 500 mg  500 mg Oral BID WC Myriam Forehand, NP   500 mg at 11/16/23 1702   pantoprazole (PROTONIX) EC tablet 40 mg  40 mg Oral Daily Myriam Forehand, NP   40 mg at 11/16/23 1703   traZODone (DESYREL) tablet 50 mg  50 mg Oral QHS Myriam Forehand, NP   50 mg at 11/15/23 2115   venlafaxine XR (EFFEXOR-XR) 24 hr capsule 150 mg  150 mg Oral Q breakfast Myriam Forehand, NP   150 mg at 11/16/23 0830    Lab Results: No results found for this or any previous visit (from the past 48 hours).  Blood Alcohol level:  Lab Results  Component Value Date   ETH <10 11/14/2023   ETH <10 08/19/2023    Metabolic Disorder Labs: Lab Results  Component Value Date   HGBA1C 5.1 12/30/2022   MPG 99.67 12/30/2022   MPG 105 09/01/2022   Lab Results  Component Value Date   PROLACTIN 10.0 09/01/2022   PROLACTIN 10.3 04/16/2022   Lab Results  Component Value Date   CHOL 200 (H) 12/30/2022   TRIG 114 12/30/2022   HDL 40 (L) 12/30/2022   CHOLHDL 5.0 12/30/2022   VLDL 23 12/30/2022   LDLCALC 137 (H) 12/30/2022   LDLCALC 170 (H) 09/01/2022    Physical Findings: AIMS:  , ,  ,  ,    CIWA:    COWS:     Musculoskeletal: Strength & Muscle Tone: within normal limits Gait & Station: normal Patient leans: N/A  Psychiatric Specialty Exam:  Presentation  General Appearance:  Appropriate for  Environment  Eye Contact: Minimal  Speech: Clear and Coherent; Normal Rate  Speech Volume: Decreased  Handedness: Right   Mood and Affect  Mood: Depressed  Affect: Blunt; Flat   Thought Process  Thought Processes: Coherent  Descriptions of Associations:Intact  Orientation:Full (Time, Place and Person)  Thought Content:WDL  History of Schizophrenia/Schizoaffective disorder:No  Duration of Psychotic Symptoms:N/A  Hallucinations:Hallucinations: None  Ideas of Reference:None  Suicidal Thoughts:Suicidal Thoughts: No  Homicidal Thoughts:Homicidal Thoughts: No   Sensorium  Memory: Immediate Good; Recent Good; Remote Good  Judgment: Fair  Insight: Fair   Art therapist  Concentration: Fair  Attention Span: Fair  Recall: Good  Fund of Knowledge: Good  Language: Good   Psychomotor Activity  Psychomotor Activity: Psychomotor Activity: Normal   Assets  Assets: Housing; Social Support; Desire for Improvement; Communication Skills   Sleep  Sleep: Sleep: Good Number of Hours of Sleep: 7    Physical Exam: Physical Exam Vitals and nursing note reviewed.  Constitutional:      Appearance: Normal appearance.  HENT:     Head: Normocephalic and atraumatic.     Nose: Nose normal.  Pulmonary:     Effort: Pulmonary effort is normal.  Musculoskeletal:        General: Normal range of motion.     Cervical back: Normal range of motion.  Skin:    General: Skin is warm.     Comments: Wounds on left and right forearms  Neurological:     General: No focal deficit present.     Mental Status: He is alert and oriented to person, place, and time. Mental status  is at baseline.  Psychiatric:        Attention and Perception: Attention and perception normal.        Mood and Affect: Mood is depressed. Affect is blunt and flat.        Speech: Speech normal.        Behavior: Behavior normal. Behavior is cooperative.        Thought Content:  Thought content normal.        Cognition and Memory: Cognition and memory normal.        Judgment: Judgment is impulsive.    Review of Systems  Skin:        Wounds on left and right forearms  Psychiatric/Behavioral:  Positive for depression. The patient is nervous/anxious.   All other systems reviewed and are negative.  Blood pressure 124/78, pulse 97, temperature 98.3 F (36.8 C), resp. rate 19, height 5\' 5"  (1.651 m), weight 103.4 kg, SpO2 98%. Body mass index is 37.93 kg/m.   Treatment Plan Summary: Daily contact with patient to assess and evaluate symptoms and progress in treatment and Medication management Buspirone (Buspar) 10mg  BID - Anxiolytic for Generalized Anxiety Disorder and social anxiety. Aripiprazole (Abilify) 15mg  nightly - Atypical antipsychotic for mood stabilization and impulse control. Trazodone 50mg  nightly - Serotonin antagonist and reuptake inhibitor (SARI) for insomnia. Venlafaxine (Effexor) 150mg  in the AM - SNRI for depression, anxiety, and emotional regulation. Metformin 500mg  - Used for metabolic support, often prescribed for weight management or insulin resistance. Monitor every 15 minutes for self-injurious behavior (SIB) and suicide risk. Implement safety planning. oordinate with outpatient psychiatrist and therapist to ensure continuity of care post-discharge. Consider DBT-based outpatient therapy for emotional regulation and impulsivity management. Myriam Forehand, NP 11/16/2023, 8:31 PM

## 2023-11-16 NOTE — Progress Notes (Signed)
 Patient stated that he would prefer to have his wound care done at night, being that this was the time that it was done on yesterday.

## 2023-11-16 NOTE — Plan of Care (Signed)
   Problem: Education: Goal: Knowledge of [place] [facility] Education information/materials will improve Outcome: Progressing

## 2023-11-16 NOTE — Group Note (Signed)
 [facility] LCSW Group Therapy Note   Group Date: 11/16/2023 Start Time: 1400 End Time: 1450   Type of Therapy/Topic:  Group Therapy:  Emotion Regulation  Participation Level:  Active    Description of Group:    The purpose of this group is to assist patients in learning to regulate negative emotions and experience positive emotions. Patients will be guided to discuss ways in which they have been vulnerable to their negative emotions. These vulnerabilities will be juxtaposed with experiences of positive emotions or situations, and patients challenged to use positive emotions to combat negative ones. Special emphasis will be placed on coping with negative emotions in conflict situations, and patients will process healthy conflict resolution skills.  Therapeutic Goals: Patient will identify two positive emotions or experiences to reflect on in order to balance out negative emotions:  Patient will label two or more emotions that they find the most difficult to experience:  Patient will be able to demonstrate positive conflict resolution skills through discussion or role plays:   Summary of Patient Progress: Patient was present for the entirety of the group process. He was actively engaged in the discussion while writing in his notebook. Pt shared that he was proud of himself for making the discussion to come into treatment instead of continuing to self-harm. He appeared open and receptive to feedback/comments from both his peers and facilitator.    Therapeutic Modalities:   Cognitive Behavioral Therapy Feelings Identification Dialectical Behavioral Therapy   Glenis Smoker, LCSW

## 2023-11-16 NOTE — Group Note (Signed)
 Date:  11/16/2023 Time:  9:13 PM  Group Topic/Focus:  Healthy Communication:   The focus of this group is to discuss communication, barriers to communication, as well as healthy ways to communicate with others.    Participation Level:  Active  Participation Quality:  Appropriate  Affect:  Appropriate  Cognitive:  Appropriate  Insight: Appropriate  Engagement in Group:  Engaged  Modes of Intervention:  Activity  Additional Comments:    Kanita Delage 11/16/2023, 9:13 PM

## 2023-11-16 NOTE — Progress Notes (Signed)
   11/16/23 0830  Psych Admission Type (Psych Patients Only)  Admission Status Voluntary  Psychosocial Assessment  Patient Complaints None  Eye Contact Other (Comment) (WNL)  Facial Expression Flat  Affect Blunted  Speech Soft  Interaction Minimal  Motor Activity Slow  Appearance/Hygiene Unremarkable  Behavior Characteristics Cooperative;Calm  Mood Depressed  Thought Process  Coherency WDL  Hallucination None reported or observed  Judgment Impaired  Danger to Self  Current suicidal ideation? Denies  Agreement Not to Harm Self Yes  Description of Agreement Verbal  Danger to Others  Danger to Others None reported or observed

## 2023-11-16 NOTE — Plan of Care (Signed)
   Problem: Education: Goal: Emotional status will improve Outcome: Progressing Goal: Mental status will improve Outcome: Progressing   Problem: Activity: Goal: Sleeping patterns will improve Outcome: Progressing   Problem: Coping: Goal: Ability to verbalize frustrations and anger appropriately will improve Outcome: Progressing

## 2023-11-16 NOTE — Group Note (Signed)
 Date:  11/16/2023 Time:  1:47 PM  Group Topic/Focus:  Movie Group  The purpose of this group is to allow patients to watch a relaxing educational movie about coping with adult life and trials ongoing in adulthood.     Participation Level:  Active  Participation Quality:  Appropriate  Affect:  Appropriate  Cognitive:  Alert and Appropriate  Insight: Appropriate  Engagement in Group:  Engaged  Modes of Intervention:  Activity and Education  Additional Comments:    Marta Antu 11/16/2023, 1:47 PM

## 2023-11-17 DIAGNOSIS — F332 Major depressive disorder, recurrent severe without psychotic features: Secondary | ICD-10-CM | POA: Diagnosis not present

## 2023-11-17 NOTE — Plan of Care (Signed)
   Problem: Education: Goal: Knowledge of [place] [facility] Education information/materials will improve Outcome: Progressing Goal: Emotional status will improve Outcome: Progressing

## 2023-11-17 NOTE — Group Note (Signed)
 Date:  11/17/2023 Time:  11:20 PM  Group Topic/Focus:  Wrap-Up Group:   The focus of this group is to help patients review their daily goal of treatment and discuss progress on daily workbooks.    Participation Level:  Active  Participation Quality:  Appropriate  Affect:  Appropriate  Cognitive:  Appropriate  Insight: Appropriate  Engagement in Group:  Engaged  Modes of Intervention:  Discussion    Antonio [name] 11/17/2023, 11:20 PM

## 2023-11-17 NOTE — Progress Notes (Addendum)
 [facility] MD Progress Note   11/17/2023 8:08 PM Antonio [name]  MRN:  [id number] Subjective:  18 year old transgender male who requests placement in a long-term behavioral health facility. He has been observed engaging in the milieu and participating in group therapy.Upon assessment, the patient's bilateral wounds with sutures are intact and protected, with no signs of infection or dehiscence. Despite participation in group activities, he presents with a flat affect and guarded demeanor. 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   Asthma    Bupropion overdose 01/16/2020   Complication of anesthesia    gets disoriented and shakes after surgery   Deliberate self-cutting 10/17/2018   Depression    Eczema    Fracture of 5th metatarsal    Gender dysphoria    Intentional overdose of drug in tablet form (HCC) 04/03/2020   MDD (major depressive disorder), recurrent severe, without psychosis (HCC) 11/02/2021   Moderate episode of recurrent major depressive disorder (HCC) 04/24/2020   Multiple allergies    Severe episode of recurrent major depressive disorder, without psychotic features (HCC) 01/02/2020   Suicidal ideations 08/31/2022   Suicide attempt by cutting of wrist (HCC) 07/28/2022    Past Surgical History:  Procedure Laterality Date   SUPPRELIN IMPLANT Left 01/22/2019   Procedure: SUPPRELIN IMPLANT;  Surgeon: Kandice Hams, MD;  Location: [facility];  Service: Pediatrics;  Laterality: Left;   SUPPRELIN REMOVAL N/A 03/03/2020   Procedure: SUPPRELIN REMOVAL;  Surgeon: Kandice Hams, MD;  Location: [facility];  Service: Pediatrics;  Laterality: N/A;   TONSILLECTOMY     TYMPANOSTOMY TUBE PLACEMENT     Family History:  Family History  Problem Relation Age of Onset   Depression Mother    Anxiety disorder Mother    Hypertension Maternal Grandmother    Allergic rhinitis Neg Hx    Angioedema Neg Hx    Atopy Neg Hx    Eczema Neg Hx    Immunodeficiency Neg Hx    Urticaria Neg Hx    Family Psychiatric  History: see above Social History:  Social History   Substance and Sexual Activity  Alcohol Use No     Social History   Substance and Sexual Activity  Drug Use Not Currently   Types: Marijuana    Social History   Socioeconomic History   Marital status: Single    Spouse name: Not on file   Number of children: Not on file   Years of education: Not on file   Highest education level: 12th grade  Occupational History   Not on file  Tobacco Use   Smoking status: Never   Smokeless tobacco: Never  Vaping Use   Vaping status: Former   Substances: Nicotine, Flavoring   Devices: Nicotine  Substance and Sexual Activity   Alcohol use: No   Drug use: Not Currently    Types: Marijuana   Sexual activity: Not Currently  Other Topics Concern   Not on file  Social History Narrative   Lives with  mom and stepdad.      He is in 12th grade at [employer] 23-24 school year      He enjoys writing and listen to music, writes poetry   Social Drivers of Health   Financial Resource Strain: Patient Declined (08/29/2023)   Overall Financial Resource Strain (CARDIA)    Difficulty of Paying Living Expenses: Patient declined  Food Insecurity: No Food Insecurity (11/15/2023)   Hunger Vital Sign    Worried About Running Out of Food in the Last Year: Never true    Ran Out of Food in the Last Year: Never  true  Transportation Needs: No Transportation Needs (11/15/2023)   PRAPARE - Administrator, Civil Service (Medical): No    Lack of Transportation (Non-Medical): No  Recent Concern: Transportation Needs - Unmet Transportation Needs (08/29/2023)   PRAPARE - Transportation    Lack of Transportation (Medical): Yes    Lack of Transportation (Non-Medical): Yes  Physical Activity: Unknown (08/29/2023)   Exercise Vital Sign    Days of Exercise per Week: 0 days    Minutes of Exercise per Session: Not on file  Stress: Stress Concern Present (08/29/2023)   [employer] of Occupational Health - Occupational Stress Questionnaire    Feeling of Stress : Very much  Social Connections: Socially Isolated (08/29/2023)   Social Connection and Isolation Panel [NHANES]    Frequency of Communication with Friends and Family: Once a week    Frequency of Social Gatherings with Friends and Family: Never    Attends Religious Services: Never    Database administrator or Organizations: No    Attends Engineer, structural: Not on file    Marital Status: Never married   Additional Social History:                         Sleep: Good  Appetite:  Good  Current Medications: Current Facility-Administered Medications  Medication Dose Route Frequency Provider Last Rate Last Admin   acetaminophen (TYLENOL) tablet 650 mg  650 mg Oral Q6H PRN Onuoha, Chinwendu V, NP       alum & mag hydroxide-simeth (MAALOX/MYLANTA) 200-200-20 MG/5ML suspension 30 mL  30 mL Oral Q4H PRN Onuoha, Chinwendu V, NP       ARIPiprazole (ABILIFY) tablet 15 mg  15 mg Oral QHS Myriam Forehand, NP   15 mg at 11/17/23 2101   busPIRone (BUSPAR) tablet 10 mg  10 mg Oral BID Myriam Forehand, NP   10 mg at 11/18/23 1809   haloperidol (HALDOL) tablet 5 mg  5 mg Oral TID PRN Onuoha, Chinwendu V, NP       And   diphenhydrAMINE (BENADRYL) capsule 50 mg  50 mg Oral TID PRN Onuoha, Chinwendu V, NP       haloperidol lactate  (HALDOL) injection 5 mg  5 mg Intramuscular TID PRN Onuoha, Chinwendu V, NP       And   diphenhydrAMINE (BENADRYL) injection 50 mg  50 mg Intramuscular TID PRN Onuoha, Chinwendu V, NP       And   LORazepam (ATIVAN) injection 2 mg  2 mg Intramuscular TID PRN Onuoha, Chinwendu V, NP       haloperidol lactate (HALDOL) injection 10 mg  10 mg Intramuscular TID PRN Onuoha, Chinwendu V, NP       And   diphenhydrAMINE (BENADRYL) injection 50 mg  50 mg Intramuscular TID PRN Onuoha, Chinwendu V, NP  And   LORazepam (ATIVAN) injection 2 mg  2 mg Intramuscular TID PRN Onuoha, Chinwendu V, NP       magnesium hydroxide (MILK OF MAGNESIA) suspension 30 mL  30 mL Oral Daily PRN Onuoha, Chinwendu V, NP       melatonin tablet 5 mg  5 mg Oral QHS Myriam Forehand, NP   5 mg at 11/17/23 2101   metFORMIN (GLUCOPHAGE) tablet 500 mg  500 mg Oral BID WC Myriam Forehand, NP   500 mg at 11/18/23 1808   pantoprazole (PROTONIX) EC tablet 40 mg  40 mg Oral Daily Myriam Forehand, NP   40 mg at 11/18/23 0844   traZODone (DESYREL) tablet 50 mg  50 mg Oral QHS Myriam Forehand, NP   50 mg at 11/17/23 2101   venlafaxine XR (EFFEXOR-XR) 24 hr capsule 150 mg  150 mg Oral Q breakfast Myriam Forehand, NP   150 mg at 11/18/23 1610    Lab Results: No results found for this or any previous visit (from the past 48 hours).  Blood Alcohol level:  Lab Results  Component Value Date   ETH <10 11/14/2023   ETH <10 08/19/2023    Metabolic Disorder Labs: Lab Results  Component Value Date   HGBA1C 5.1 12/30/2022   MPG 99.67 12/30/2022   MPG 105 09/01/2022   Lab Results  Component Value Date   PROLACTIN 10.0 09/01/2022   PROLACTIN 10.3 04/16/2022   Lab Results  Component Value Date   CHOL 200 (H) 12/30/2022   TRIG 114 12/30/2022   HDL 40 (L) 12/30/2022   CHOLHDL 5.0 12/30/2022   VLDL 23 12/30/2022   LDLCALC 137 (H) 12/30/2022   LDLCALC 170 (H) 09/01/2022    Physical Findings: AIMS:  , ,  ,  ,    CIWA:    COWS:      Musculoskeletal: Strength & Muscle Tone: within normal limits Gait & Station: normal Patient leans: N/A  Psychiatric Specialty Exam:  Presentation  General Appearance:  Appropriate for Environment (sutures intact and protected.)  Eye Contact: Minimal (Cooperative but guarded in interactions.)  Speech: Clear and Coherent; Normal Rate  Speech Volume: Normal  Handedness: Right   Mood and Affect  Mood: -- (Neutral to subdued.)  Affect: Flat   Thought Process  Thought Processes: Coherent; Linear  Descriptions of Associations:Intact  Orientation:Full (Time, Place and Person)  Thought Content:Logical (Requests long-term behavioral health placement; does not endorse suicidal or homicidal ideation.)  History of Schizophrenia/Schizoaffective disorder:No  Duration of Psychotic Symptoms:N/A  Hallucinations:Hallucinations: None  Ideas of Reference:None  Suicidal Thoughts:Suicidal Thoughts: No  Homicidal Thoughts:Homicidal Thoughts: No   Sensorium  Memory: Immediate Good; Recent Good; Remote Good  Judgment: Fair (into need for continued treatment;)  Insight: Fair   Art therapist  Concentration: Fair  Attention Span: Fair  Recall: Good  Fund of Knowledge: Good  Language: Good   Psychomotor Activity  Psychomotor Activity: Psychomotor Activity: Normal   Assets  Assets: Desire for Improvement; Communication Skills; Social Support; Housing; Health and safety inspector   Sleep  Sleep: Sleep: Fair Number of Hours of Sleep: 7    Physical Exam: Physical Exam Vitals and nursing note reviewed.  Constitutional:      Appearance: Normal appearance.  HENT:     Head: Normocephalic and atraumatic.     Nose: Nose normal.  Pulmonary:     Effort: Pulmonary effort is normal.  Musculoskeletal:        General: Normal range of motion.  Cervical back: Normal range of motion.  Neurological:     General: No focal deficit present.      Mental Status: He is alert and oriented to person, place, and time. Mental status is at baseline.  Psychiatric:        Attention and Perception: Attention and perception normal.        Mood and Affect: Mood is depressed. Affect is flat.        Speech: Speech normal.        Behavior: Behavior normal. Behavior is cooperative.        Thought Content: Thought content normal.        Cognition and Memory: Cognition and memory normal.        Judgment: Judgment is impulsive.    Review of Systems  Psychiatric/Behavioral:  Positive for depression.   All other systems reviewed and are negative.  Blood pressure (!) 142/80, pulse (!) 103, temperature 98.5 F (36.9 C), resp. rate 18, height 5\' 5"  (1.651 m), weight 103.4 kg, SpO2 100%. Body mass index is 37.93 kg/m.   Treatment Plan Summary: Daily contact with patient to assess and evaluate symptoms and progress in treatment and Medication management Buspirone (Buspar) 10mg  BID - Anxiolytic for Generalized Anxiety Disorder and social anxiety. Aripiprazole (Abilify) 15mg  nightly - Atypical antipsychotic for mood stabilization and impulse control. Trazodone 50mg  nightly - Serotonin antagonist and reuptake inhibitor (SARI) for insomnia. Venlafaxine (Effexor) 150mg  in the AM - SNRI for depression, anxiety, and emotional regulation. Metformin 500mg  - Used for metabolic support, often prescribed for weight management or insulin resistance. Monitor every 15 minutes for self-injurious behavior (SIB) and suicide risk. Implement safety planning. oordinate with outpatient psychiatrist and therapist to ensure continuity of care post-discharge. Consider DBT-based outpatient therapy for emotional regulation and impulsivity management. Myriam Forehand, NP 11/17/2023, 8:08 PM

## 2023-11-17 NOTE — Group Note (Signed)
 Date:  11/17/2023 Time:  10:03 AM  Group Topic/Focus:  Crisis Planning:   The purpose of this group is to help patients create a crisis plan for use upon discharge or in the future, as needed.    Participation Level:  Active  Participation Quality:  Appropriate  Affect:  Appropriate  Cognitive:  Appropriate  Insight: Appropriate  Engagement in Group:  Engaged  Modes of Intervention:  Activity  Additional Comments:    Antonio [name] [name] 11/17/2023, 10:03 AM

## 2023-11-17 NOTE — Group Note (Unsigned)
 [facility] LCSW Group Therapy Note    Group Date: 11/17/2023 Start Time: 1300 End Time: 1400  Type of Therapy and Topic:  Group Therapy:  Overcoming Obstacles  Participation Level:  {[facility] PARTICIPATION LEVEL:22264:::1}  Mood:  Description of Group:   In this group patients will be encouraged to explore what they see as obstacles to their own wellness and recovery. They will be guided to discuss their thoughts, feelings, and behaviors related to these obstacles. The group will process together ways to cope with barriers, with attention given to specific choices patients can make. Each patient will be challenged to identify changes they are motivated to make in order to overcome their obstacles. This group will be process-oriented, with patients participating in exploration of their own experiences as well as giving and receiving support and challenge from other group members.  Therapeutic Goals: 1. Patient will identify personal and current obstacles as they relate to admission. 2. Patient will identify barriers that currently interfere with their wellness or overcoming obstacles.  3. Patient will identify feelings, thought process and behaviors related to these barriers. 4. Patient will identify two changes they are willing to make to overcome these obstacles:    Summary of Patient Progress   ***   Therapeutic Modalities:   Cognitive Behavioral Therapy Solution Focused Therapy Motivational Interviewing Relapse Prevention Therapy   Lowry Ram, LCSW

## 2023-11-17 NOTE — Group Note (Signed)
 Recreation Therapy Group Note   Group Topic:Healthy Support Systems  Group Date: 11/17/2023 Start Time: 1000 End Time: 1050 Facilitators: Rosina Lowenstein, LRT, CTRS Location:  Craft Room  Group Description: Straw Bridge. Individually, patients were given 10 plastic drinking straws and an equal length of masking tape. Using the materials provided, patients were instructed to build a free-standing bridge-like structure to suspend an everyday item (ex: puzzle box) off the floor or table surface. All materials were required to be used in Secondary school teacher. LRT facilitated post-activity discussion reviewing how we, humans, are like the structure we built; when things get too heavy in our life and we do not have adequate supports/coping skills, then we will fall just like the straw-built structure will. LRT focused on how having a "base" or structure on the bottom was necessary for the object to stand, meaning we must be secure and stable first before building on ourselves or others. Patients were encouraged to name 2 healthy supports in their life and reflect on how the skills used in this activity can be generalized to daily life post discharge.   Goal Area(s) Addressed:    Patient will identify two healthy support systems in their life.   Patient will work on Product manager.   Patient will verbalize the importance of having a strong and steady "base".    Patient will follow multi-step directions.   Patients will engage in creativity and use all provided materials.   Affect/Mood: Appropriate   Participation Level: Engaged   Participation Quality: Independent   Behavior: Appropriate and Calm   Speech/Thought Process: Coherent   Insight: Good   Judgement: Good   Modes of Intervention: Guided Discussion, Problem-solving, Support, and STEM Activity   Patient Response to Interventions:  Attentive, Engaged, Interested , and Receptive   Education Outcome:  Acknowledges  education   Clinical Observations/Individualized Feedback: Antonio [name] was active in their participation of session activities and group discussion. Pt identified "my therapist and my mom" as his healthy supports. Pt did not attempt to make a structure, as he has done this is previous admissions. Pt interacted well with LRT and peers duration of session.    Plan: Continue to engage patient in RT group sessions 2-3x/week.   Rosina Lowenstein, LRT, CTRS 11/17/2023 12:03 PM

## 2023-11-18 DIAGNOSIS — F332 Major depressive disorder, recurrent severe without psychotic features: Secondary | ICD-10-CM | POA: Diagnosis not present

## 2023-11-18 MED ORDER — ALBUTEROL SULFATE HFA 108 (90 BASE) MCG/ACT IN AERS: 2.0000 | INHALATION_SPRAY | RESPIRATORY_TRACT | Status: DC | PRN

## 2023-11-18 MED ORDER — TRIAMCINOLONE ACETONIDE 0.1 % EX CREA: TOPICAL_CREAM | Freq: Two times a day (BID) | CUTANEOUS | Status: DC | PRN

## 2023-11-18 MED ORDER — NALTREXONE HCL 50 MG PO TABS
50.0000 mg | ORAL_TABLET | Freq: Every day | ORAL | Status: DC
  Administered 2023-11-19 – 2023-11-20 (×2): 50 mg via ORAL
  Filled 2023-11-18 (×2): qty 1

## 2023-11-18 MED ORDER — PROPRANOLOL HCL 20 MG PO TABS
10.0000 mg | ORAL_TABLET | Freq: Every day | ORAL | Status: DC
  Administered 2023-11-18 – 2023-11-19 (×2): 10 mg via ORAL
  Filled 2023-11-18 (×2): qty 1

## 2023-11-18 MED ORDER — EPINEPHRINE 0.3 MG/0.3ML IJ SOAJ
0.3000 mg | Freq: Once | INTRAMUSCULAR | Status: DC
  Filled 2023-11-18: qty 0.3

## 2023-11-18 NOTE — Plan of Care (Signed)
   Problem: Education: Goal: Knowledge of [facility] Education information/materials will improve Outcome: Progressing Goal: Emotional status will improve Outcome: Progressing Goal: Mental status will improve Outcome: Progressing

## 2023-11-18 NOTE — Progress Notes (Addendum)
 [facility] MD Progress Note  11/18/2023 7:39 PM Antonio [name]  MRN:  [id number] Subjective:   18 year old transgender male who remains engaged in the milieu and group therapy but expresses anxiety regarding discharge. He continues to request long-term behavioral health placement. His bilateral wounds with sutures remain intact and protected, with no signs of infection. Given the patient's history of self-injurious behavior (SIB) and ongoing anxiety. 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   Asthma    Bupropion overdose 01/16/2020   Complication of anesthesia    gets disoriented and shakes after surgery   Deliberate self-cutting 10/17/2018   Depression    Eczema    Fracture of 5th metatarsal    Gender dysphoria    Intentional overdose of drug in tablet form (HCC) 04/03/2020   MDD (major depressive disorder), recurrent severe, without psychosis (HCC) 11/02/2021   Moderate episode of recurrent major depressive disorder (HCC) 04/24/2020   Multiple allergies    Severe episode of recurrent major depressive disorder, without psychotic features (HCC) 01/02/2020   Suicidal ideations 08/31/2022   Suicide attempt by cutting of wrist (HCC) 07/28/2022    Past Surgical History:  Procedure Laterality Date   SUPPRELIN IMPLANT Left 01/22/2019   Procedure: SUPPRELIN IMPLANT;  Surgeon: Kandice Hams, MD;  Location: [facility];  Service: Pediatrics;   Laterality: Left;   SUPPRELIN REMOVAL N/A 03/03/2020   Procedure: SUPPRELIN REMOVAL;  Surgeon: Kandice Hams, MD;  Location: [facility];  Service: Pediatrics;  Laterality: N/A;   TONSILLECTOMY     TYMPANOSTOMY TUBE PLACEMENT     Family History:  Family History  Problem Relation Age of Onset   Depression Mother    Anxiety disorder Mother    Hypertension Maternal Grandmother    Allergic rhinitis Neg Hx    Angioedema Neg Hx    Atopy Neg Hx    Eczema Neg Hx    Immunodeficiency Neg Hx    Urticaria Neg Hx    Family Psychiatric  History: see above Social History:  Social History   Substance and Sexual Activity  Alcohol Use No     Social History   Substance and Sexual Activity  Drug Use Not Currently   Types: Marijuana    Social History   Socioeconomic History   Marital status: Single    Spouse name: Not on file   Number of children: Not on file   Years of education: Not on file   Highest education level: 12th grade  Occupational History   Not on file  Tobacco Use   Smoking status: Never   Smokeless tobacco: Never  Vaping Use   Vaping status: Former   Substances: Nicotine, Flavoring   Devices: Nicotine  Substance and Sexual Activity   Alcohol use: No   Drug use: Not Currently    Types: Marijuana   Sexual activity: Not Currently  Other Topics Concern   Not on file  Social History Narrative   Lives with mom and stepdad.  He is in 12th grade at [employer] 23-24 school year      He enjoys writing and listen to music, writes poetry   Social Drivers of Health   Financial Resource Strain: Patient Declined (08/29/2023)   Overall Financial Resource Strain (CARDIA)    Difficulty of Paying Living Expenses: Patient declined  Food Insecurity: No Food Insecurity (11/15/2023)   Hunger Vital Sign    Worried About Running Out of Food in the Last Year: Never true    Ran Out of Food in the Last Year: Never true  Transportation Needs: No  Transportation Needs (11/15/2023)   PRAPARE - Administrator, Civil Service (Medical): No    Lack of Transportation (Non-Medical): No  Recent Concern: Transportation Needs - Unmet Transportation Needs (08/29/2023)   PRAPARE - Transportation    Lack of Transportation (Medical): Yes    Lack of Transportation (Non-Medical): Yes  Physical Activity: Unknown (08/29/2023)   Exercise Vital Sign    Days of Exercise per Week: 0 days    Minutes of Exercise per Session: Not on file  Stress: Stress Concern Present (08/29/2023)   [employer] of Occupational Health - Occupational Stress Questionnaire    Feeling of Stress : Very much  Social Connections: Socially Isolated (08/29/2023)   Social Connection and Isolation Panel [NHANES]    Frequency of Communication with Friends and Family: Once a week    Frequency of Social Gatherings with Friends and Family: Never    Attends Religious Services: Never    Database administrator or Organizations: No    Attends Engineer, structural: Not on file    Marital Status: Never married   Additional Social History:                         Sleep: Good  Appetite:  Good  Current Medications: Current Facility-Administered Medications  Medication Dose Route Frequency Provider Last Rate Last Admin   acetaminophen (TYLENOL) tablet 650 mg  650 mg Oral Q6H PRN Onuoha, Chinwendu V, NP       albuterol (VENTOLIN HFA) 108 (90 Base) MCG/ACT inhaler 2 puff  2 puff Inhalation Q4H PRN Myriam Forehand, NP       alum & mag hydroxide-simeth (MAALOX/MYLANTA) 200-200-20 MG/5ML suspension 30 mL  30 mL Oral Q4H PRN Onuoha, Chinwendu V, NP       ARIPiprazole (ABILIFY) tablet 15 mg  15 mg Oral QHS Myriam Forehand, NP   15 mg at 11/17/23 2101   busPIRone (BUSPAR) tablet 10 mg  10 mg Oral BID Myriam Forehand, NP   10 mg at 11/18/23 1809   haloperidol (HALDOL) tablet 5 mg  5 mg Oral TID PRN Onuoha, Chinwendu V, NP       And   diphenhydrAMINE (BENADRYL)  capsule 50 mg  50 mg Oral TID PRN Onuoha, Chinwendu V, NP       haloperidol lactate (HALDOL) injection 5 mg  5 mg Intramuscular TID PRN Onuoha, Chinwendu V, NP       And   diphenhydrAMINE (BENADRYL) injection 50 mg  50 mg Intramuscular TID PRN Onuoha, Chinwendu V, NP       And   LORazepam (ATIVAN) injection 2 mg  2 mg Intramuscular TID PRN Onuoha, Chinwendu V, NP       haloperidol lactate (HALDOL) injection 10 mg  10 mg Intramuscular TID PRN Onuoha, Chinwendu V, NP       And  diphenhydrAMINE (BENADRYL) injection 50 mg  50 mg Intramuscular TID PRN Onuoha, Chinwendu V, NP       And   LORazepam (ATIVAN) injection 2 mg  2 mg Intramuscular TID PRN Onuoha, Chinwendu V, NP       EPINEPHrine (EPI-PEN) injection 0.3 mg  0.3 mg Intramuscular Once Myriam Forehand, NP       magnesium hydroxide (MILK OF MAGNESIA) suspension 30 mL  30 mL Oral Daily PRN Onuoha, Chinwendu V, NP       melatonin tablet 5 mg  5 mg Oral QHS Myriam Forehand, NP   5 mg at 11/17/23 2101   metFORMIN (GLUCOPHAGE) tablet 500 mg  500 mg Oral BID WC Myriam Forehand, NP   500 mg at 11/18/23 1808   [START ON 11/19/2023] naltrexone (DEPADE) tablet 50 mg  50 mg Oral Daily Myriam Forehand, NP       pantoprazole (PROTONIX) EC tablet 40 mg  40 mg Oral Daily Myriam Forehand, NP   40 mg at 11/18/23 0844   propranolol (INDERAL) tablet 10 mg  10 mg Oral QHS Myriam Forehand, NP       traZODone (DESYREL) tablet 50 mg  50 mg Oral QHS Myriam Forehand, NP   50 mg at 11/17/23 2101   triamcinolone cream (KENALOG) 0.1 % cream   Topical BID PRN Myriam Forehand, NP       venlafaxine XR (EFFEXOR-XR) 24 hr capsule 150 mg  150 mg Oral Q breakfast Myriam Forehand, NP   150 mg at 11/18/23 1610    Lab Results: No results found for this or any previous visit (from the past 48 hours).  Blood Alcohol level:  Lab Results  Component Value Date   ETH <10 11/14/2023   ETH <10 08/19/2023    Metabolic Disorder Labs: Lab Results  Component Value Date   HGBA1C 5.1 12/30/2022   MPG  99.67 12/30/2022   MPG 105 09/01/2022   Lab Results  Component Value Date   PROLACTIN 10.0 09/01/2022   PROLACTIN 10.3 04/16/2022   Lab Results  Component Value Date   CHOL 200 (H) 12/30/2022   TRIG 114 12/30/2022   HDL 40 (L) 12/30/2022   CHOLHDL 5.0 12/30/2022   VLDL 23 12/30/2022   LDLCALC 137 (H) 12/30/2022   LDLCALC 170 (H) 09/01/2022    Physical Findings: AIMS:  , ,  ,  ,    CIWA:    COWS:     Musculoskeletal: Strength & Muscle Tone: within normal limits Gait & Station: normal Patient leans: N/A  Psychiatric Specialty Exam:  Presentation  General Appearance:  Appropriate for Environment; Fairly Groomed  Eye Contact: Minimal (Cooperative but guarded in interactions.)  Speech: Clear and Coherent; Normal Rate  Speech Volume: Normal  Handedness: Right   Mood and Affect  Mood: -- (Neutral to subdued.)  Affect: Flat   Thought Process  Thought Processes: Coherent; Linear  Descriptions of Associations:Intact  Orientation:Full (Time, Place and Person)  Thought Content:Logical (Requests long-term behavioral health placement; does not endorse suicidal or homicidal ideation.)  History of Schizophrenia/Schizoaffective disorder:No  Duration of Psychotic Symptoms:N/A  Hallucinations:Hallucinations: None  Ideas of Reference:None  Suicidal Thoughts:Suicidal Thoughts: No  Homicidal Thoughts:Homicidal Thoughts: No   Sensorium  Memory: Immediate Good; Recent Good; Remote Good  Judgment: Fair (into need for continued treatment;)  Insight: Fair   Art therapist  Concentration: Fair  Attention Span: Fair  Recall: Good  Fund of Knowledge: Good  Language:  Good   Psychomotor Activity  Psychomotor Activity: Psychomotor Activity: Normal   Assets  Assets: Desire for Improvement; Communication Skills; Social Support; Housing; Health and safety inspector   Sleep  Sleep: Sleep: Fair Number of Hours of Sleep:  7    Physical Exam: Physical Exam Vitals and nursing note reviewed.  Constitutional:      Appearance: Normal appearance.  HENT:     Head: Normocephalic and atraumatic.     Nose: Nose normal.  Pulmonary:     Effort: Pulmonary effort is normal.  Musculoskeletal:        General: Normal range of motion.     Cervical back: Normal range of motion.  Neurological:     General: No focal deficit present.     Mental Status: He is alert. Mental status is at baseline.  Psychiatric:        Attention and Perception: Attention and perception normal.        Mood and Affect: Mood is depressed. Affect is flat.        Speech: Speech normal.        Behavior: Behavior normal. Behavior is cooperative.        Cognition and Memory: Cognition and memory normal.        Judgment: Judgment is impulsive.    Review of Systems  Psychiatric/Behavioral:  Positive for depression.   All other systems reviewed and are negative.  Blood pressure (!) 142/80, pulse (!) 103, temperature 98.5 F (36.9 C), resp. rate 18, height 5\' 5"  (1.651 m), weight 103.4 kg, SpO2 100%. Body mass index is 37.93 kg/m.   Treatment Plan Summary: Daily contact with patient to assess and evaluate symptoms and progress in treatment and Medication management Depade (Naltrexone) 50 mg daily to help reduce SIB urges Propranolol 10 mg nightly to manage anxiety symptoms and physiological responses (tachycardia, restlessness) Buspirone (Buspar) 10mg  BID - Anxiolytic for Generalized Anxiety Disorder and social anxiety. Aripiprazole (Abilify) 15mg  nightly - Atypical antipsychotic for mood stabilization and impulse control. Trazodone 50mg  nightly - Serotonin antagonist and reuptake inhibitor (SARI) for insomnia. Venlafaxine (Effexor) 150mg  in the AM - SNRI for depression, anxiety, and emotional regulation. Metformin 500mg  - Used for metabolic support, often prescribed for weight management or insulin resistance. Monitor every 15 minutes for  self-injurious behavior (SIB) and suicide risk. Implement safety planning. oordinate with outpatient psychiatrist and therapist to ensure continuity of care post-discharge. Consider DBT-based outpatient therapy for emotional regulation and impulsivity management. Myriam Forehand, NP 11/18/2023, 7:39 PM

## 2023-11-18 NOTE — Group Note (Signed)
 Date:  11/18/2023 Time:  1:34 PM  Group Topic/Focus:  Rediscovering Joy:   The focus of this group is to explore various ways to relieve stress in a positive manner.    Participation Level:  Active  Participation Quality:  Appropriate  Affect:  Appropriate  Cognitive:  Appropriate  Insight: Appropriate  Engagement in Group:  Engaged  Modes of Intervention:  Activity  Additional Comments:    Mary Sella Erza [place]head 11/18/2023, 1:34 PM

## 2023-11-18 NOTE — Group Note (Signed)
 Recreation Therapy Group Note   Group Topic:Leisure Education  Group Date: 11/18/2023 Start Time: 1000 End Time: 1100 Facilitators: Rosina Lowenstein, LRT, CTRS Location:  Craft Room  Group Description: Leisure. Patients were given the option to choose from singing karaoke, coloring mandalas, using oil pastels, journaling, or playing with play-doh. LRT and pts discussed the meaning of leisure, the importance of participating in leisure during their free time/when they're outside of the hospital, as well as how our leisure interests can also serve as coping skills.   Goal Area(s) Addressed:  Patient will identify a current leisure interest.  Patient will learn the definition of "leisure". Patient will practice making a positive decision. Patient will have the opportunity to try a new leisure activity. Patient will communicate with peers and LRT.    Affect/Mood: Flat and Restricted   Participation Level: Non-verbal    Clinical Observations/Individualized Feedback: Antonio [name] was present in group. Pt chose not to speak at all and write things down on a notepad when wanting to tell LRT something. Pt wrote that he "writes poetry and plays cards" in his free time. Pt chose to play with play doh while in group. Pt did not interact with LRT or peers.   Plan: Continue to engage patient in RT group sessions 2-3x/week.   Rosina Lowenstein, LRT, CTRS 11/18/2023 11:54 AM

## 2023-11-18 NOTE — Group Note (Signed)
 Date:  11/18/2023 Time:  9:24 PM  Group Topic/Focus:  Wrap-Up Group:   The focus of this group is to help patients review their daily goal of treatment and discuss progress on daily workbooks.    Participation Level:  Active  Participation Quality:  Appropriate  Affect:  Appropriate  Cognitive:  Appropriate  Insight: Good  Engagement in Group:  Engaged  Modes of Intervention:  Discussion  Additional Comments:    Maeola Harman 11/18/2023, 9:24 PM

## 2023-11-18 NOTE — Progress Notes (Signed)
 Patient requested dressing be changed.  Has sutures in right forearm.  Wound is clean, dry. No redness, swelling or erythema.  Edges well approximated, no drainage.  DSD reapplied with paper tape.

## 2023-11-18 NOTE — Group Note (Signed)
 Date:  11/18/2023 Time:  4:39 PM  Group Topic/Focus:  Activity Group: The focus of the group is to promote activity for the patients and to encourage them to go outside in the courtyard and get some fresh air and some exercise.    Participation Level:  Active  Participation Quality:  Appropriate  Affect:  Appropriate  Cognitive:  Appropriate  Insight: Appropriate  Engagement in Group:  Engaged  Modes of Intervention:  Activity  Additional Comments:    Mary Sella Dacen Frayre 11/18/2023, 4:39 PM

## 2023-11-18 NOTE — Progress Notes (Signed)
 Patient is a voluntary admission to [facility]U for MDD.  From a group home with plans to discharge Sunday.  Patient is calm, cooperative, friendly with staff. Denies SI, HI, AVH, anxiety and depression.  Will continue to monitor.

## 2023-11-19 DIAGNOSIS — F332 Major depressive disorder, recurrent severe without psychotic features: Secondary | ICD-10-CM | POA: Diagnosis not present

## 2023-11-19 NOTE — Progress Notes (Signed)
 [facility] MD Progress Note  11/19/2023 7:53 PM Antonio [name]  MRN:  [id number] Subjective:  18 year old transgender male,presents as engaging and interactive today, actively participating in the milieu with peers. He appears more open and responsive than in previous interactions. He initiates conversation and asks, "My grandmother's birthday is tomorrow--when am I going home?" His tone suggests anticipation and a desire for clarity about his discharge plan, but no overt distress is noted. He previously considered long-term behavioral health placement but has now changed her mind, stating she no longer feels that's necessary. He does not elaborate on her reasoning but maintains confidence in his decision.During wound care, the dressing on his self-inflicted scars from a previous suicide attempt was changed. The patient observed the process quietly, making no verbal complaints of pain or discomfort. The wound is intact, with no weeping or signs of infection. He briefly glanced at the scars but did not comment.  Although he appears engaged and stable in the moment, his recent history of SIB and significant emotional triggers (such as her grandmother's birthday) warrant continued observation and assessment before discharge planning. Principal Problem: MDD (major depressive disorder), recurrent severe, without psychosis (HCC) Diagnosis: Principal Problem:   MDD (major depressive disorder), recurrent severe, without psychosis (HCC) Active Problems:   Deliberate self-cutting   Suicide attempt by cutting of wrist (HCC)   Borderline personality disorder (HCC)   Suicide attempt (HCC)  Total Time spent with patient: 45 minutes  Past Psychiatric History: see below  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   Asthma    Bupropion overdose 01/16/2020   Complication of anesthesia    gets disoriented  and shakes after surgery   Deliberate self-cutting 10/17/2018   Depression    Eczema    Fracture of 5th metatarsal    Gender dysphoria    Intentional overdose of drug in tablet form (HCC) 04/03/2020   MDD (major depressive disorder), recurrent severe, without psychosis (HCC) 11/02/2021   Moderate episode of recurrent major depressive disorder (HCC) 04/24/2020   Multiple allergies    Severe episode of recurrent major depressive disorder, without psychotic features (HCC) 01/02/2020   Suicidal ideations 08/31/2022   Suicide attempt by cutting of wrist (HCC) 07/28/2022    Past Surgical History:  Procedure Laterality Date   SUPPRELIN IMPLANT Left 01/22/2019   Procedure: SUPPRELIN IMPLANT;  Surgeon: Kandice Hams, MD;  Location: [facility];  Service: Pediatrics;  Laterality: Left;   SUPPRELIN REMOVAL N/A 03/03/2020   Procedure: SUPPRELIN REMOVAL;  Surgeon: Kandice Hams, MD;  Location: [facility];  Service: Pediatrics;  Laterality: N/A;   TONSILLECTOMY     TYMPANOSTOMY TUBE PLACEMENT     Family History:  Family History  Problem Relation Age of Onset   Depression Mother    Anxiety disorder Mother    Hypertension Maternal Grandmother    Allergic rhinitis Neg Hx    Angioedema Neg Hx    Atopy Neg Hx    Eczema Neg Hx    Immunodeficiency Neg Hx    Urticaria Neg Hx    Family Psychiatric  History: see above Social History:  Social History   Substance and Sexual Activity  Alcohol Use No     Social History   Substance and Sexual Activity  Drug Use Not Currently   Types: Marijuana    Social History   Socioeconomic History   Marital  status: Single    Spouse name: Not on file   Number of children: Not on file   Years of education: Not on file   Highest education level: 12th grade  Occupational History   Not on file  Tobacco Use   Smoking status: Never   Smokeless tobacco: Never  Vaping Use   Vaping status: Former   Substances: Nicotine,  Flavoring   Devices: Nicotine  Substance and Sexual Activity   Alcohol use: No   Drug use: Not Currently    Types: Marijuana   Sexual activity: Not Currently  Other Topics Concern   Not on file  Social History Narrative   Lives with mom and stepdad.      He is in 12th grade at [employer] 23-24 school year      He enjoys writing and listen to music, writes poetry   Social Drivers of Health   Financial Resource Strain: Patient Declined (08/29/2023)   Overall Financial Resource Strain (CARDIA)    Difficulty of Paying Living Expenses: Patient declined  Food Insecurity: No Food Insecurity (11/15/2023)   Hunger Vital Sign    Worried About Running Out of Food in the Last Year: Never true    Ran Out of Food in the Last Year: Never true  Transportation Needs: No Transportation Needs (11/15/2023)   PRAPARE - Administrator, Civil Service (Medical): No    Lack of Transportation (Non-Medical): No  Recent Concern: Transportation Needs - Unmet Transportation Needs (08/29/2023)   PRAPARE - Transportation    Lack of Transportation (Medical): Yes    Lack of Transportation (Non-Medical): Yes  Physical Activity: Unknown (08/29/2023)   Exercise Vital Sign    Days of Exercise per Week: 0 days    Minutes of Exercise per Session: Not on file  Stress: Stress Concern Present (08/29/2023)   [employer] of Occupational Health - Occupational Stress Questionnaire    Feeling of Stress : Very much  Social Connections: Socially Isolated (08/29/2023)   Social Connection and Isolation Panel [NHANES]    Frequency of Communication with Friends and Family: Once a week    Frequency of Social Gatherings with Friends and Family: Never    Attends Religious Services: Never    Database administrator or Organizations: No    Attends Engineer, structural: Not on file    Marital Status: Never married   Additional Social History:                         Sleep:  Good  Appetite:  Good  Current Medications: Current Facility-Administered Medications  Medication Dose Route Frequency Provider Last Rate Last Admin   acetaminophen (TYLENOL) tablet 650 mg  650 mg Oral Q6H PRN Onuoha, Chinwendu V, NP   650 mg at 11/19/23 1012   albuterol (VENTOLIN HFA) 108 (90 Base) MCG/ACT inhaler 2 puff  2 puff Inhalation Q4H PRN Myriam Forehand, NP       alum & mag hydroxide-simeth (MAALOX/MYLANTA) 200-200-20 MG/5ML suspension 30 mL  30 mL Oral Q4H PRN Onuoha, Chinwendu V, NP   30 mL at 11/19/23 0923   ARIPiprazole (ABILIFY) tablet 15 mg  15 mg Oral QHS Myriam Forehand, NP   15 mg at 11/18/23 2122   busPIRone (BUSPAR) tablet 10 mg  10 mg Oral BID Myriam Forehand, NP   10 mg at 11/19/23 1740   haloperidol (HALDOL) tablet 5 mg  5 mg Oral TID  PRN Onuoha, Chinwendu V, NP       And   diphenhydrAMINE (BENADRYL) capsule 50 mg  50 mg Oral TID PRN Onuoha, Chinwendu V, NP       haloperidol lactate (HALDOL) injection 5 mg  5 mg Intramuscular TID PRN Onuoha, Chinwendu V, NP       And   diphenhydrAMINE (BENADRYL) injection 50 mg  50 mg Intramuscular TID PRN Onuoha, Chinwendu V, NP       And   LORazepam (ATIVAN) injection 2 mg  2 mg Intramuscular TID PRN Onuoha, Chinwendu V, NP       haloperidol lactate (HALDOL) injection 10 mg  10 mg Intramuscular TID PRN Onuoha, Chinwendu V, NP       And   diphenhydrAMINE (BENADRYL) injection 50 mg  50 mg Intramuscular TID PRN Onuoha, Chinwendu V, NP       And   LORazepam (ATIVAN) injection 2 mg  2 mg Intramuscular TID PRN Onuoha, Chinwendu V, NP       EPINEPHrine (EPI-PEN) injection 0.3 mg  0.3 mg Intramuscular Once Myriam Forehand, NP       magnesium hydroxide (MILK OF MAGNESIA) suspension 30 mL  30 mL Oral Daily PRN Onuoha, Chinwendu V, NP       melatonin tablet 5 mg  5 mg Oral QHS Myriam Forehand, NP   5 mg at 11/18/23 2122   metFORMIN (GLUCOPHAGE) tablet 500 mg  500 mg Oral BID WC Myriam Forehand, NP   500 mg at 11/19/23 1740   naltrexone (DEPADE)  tablet 50 mg  50 mg Oral Daily Myriam Forehand, NP   50 mg at 11/19/23 1012   pantoprazole (PROTONIX) EC tablet 40 mg  40 mg Oral Daily Myriam Forehand, NP   40 mg at 11/19/23 1013   propranolol (INDERAL) tablet 10 mg  10 mg Oral QHS Myriam Forehand, NP   10 mg at 11/18/23 2123   traZODone (DESYREL) tablet 50 mg  50 mg Oral QHS Myriam Forehand, NP   50 mg at 11/18/23 2122   triamcinolone cream (KENALOG) 0.1 % cream   Topical BID PRN Myriam Forehand, NP       venlafaxine XR (EFFEXOR-XR) 24 hr capsule 150 mg  150 mg Oral Q breakfast Myriam Forehand, NP   150 mg at 11/19/23 1012    Lab Results: No results found for this or any previous visit (from the past 48 hours).  Blood Alcohol level:  Lab Results  Component Value Date   ETH <10 11/14/2023   ETH <10 08/19/2023    Metabolic Disorder Labs: Lab Results  Component Value Date   HGBA1C 5.1 12/30/2022   MPG 99.67 12/30/2022   MPG 105 09/01/2022   Lab Results  Component Value Date   PROLACTIN 10.0 09/01/2022   PROLACTIN 10.3 04/16/2022   Lab Results  Component Value Date   CHOL 200 (H) 12/30/2022   TRIG 114 12/30/2022   HDL 40 (L) 12/30/2022   CHOLHDL 5.0 12/30/2022   VLDL 23 12/30/2022   LDLCALC 137 (H) 12/30/2022   LDLCALC 170 (H) 09/01/2022    Physical Findings: AIMS:  , ,  ,  ,    CIWA:    COWS:     Musculoskeletal: Strength & Muscle Tone: within normal limits Gait & Station: normal Patient leans: N/A  Psychiatric Specialty Exam:  Presentation  General Appearance:  Appropriate for Environment; Fairly Groomed  Eye Contact: Good  Speech: Clear and  Coherent; Normal Rate  Speech Volume: Decreased  Handedness: Right   Mood and Affect  Mood: Depressed  Affect: Flat   Thought Process  Thought Processes: Coherent; Goal Directed  Descriptions of Associations:Intact  Orientation:Full (Time, Place and Person)  Thought Content:Logical  History of Schizophrenia/Schizoaffective disorder:No  Duration of  Psychotic Symptoms:N/A  Hallucinations:Hallucinations: None  Ideas of Reference:None  Suicidal Thoughts:Suicidal Thoughts: No  Homicidal Thoughts:Homicidal Thoughts: No   Sensorium  Memory: Immediate Good; Recent Good; Remote Good  Judgment: Fair  Insight: Fair   Art therapist  Concentration: Fair  Attention Span: Fair  Recall: Good  Fund of Knowledge: Good  Language: Good   Psychomotor Activity  Psychomotor Activity: Psychomotor Activity: Normal   Assets  Assets: Communication Skills; Housing; Financial Resources/Insurance   Sleep  Sleep: Sleep: Good Number of Hours of Sleep: 8    Physical Exam: Physical Exam Vitals and nursing note reviewed.  Constitutional:      Appearance: Normal appearance.  HENT:     Head: Normocephalic and atraumatic.     Nose: Nose normal.  Pulmonary:     Effort: Pulmonary effort is normal.  Musculoskeletal:        General: Normal range of motion.     Cervical back: Normal range of motion.  Neurological:     General: No focal deficit present.     Mental Status: He is alert. Mental status is at baseline.  Psychiatric:        Attention and Perception: Attention and perception normal.        Mood and Affect: Mood and affect normal.        Speech: Speech normal.        Behavior: Behavior normal. Behavior is cooperative.        Thought Content: Thought content normal.        Cognition and Memory: Cognition and memory normal.        Judgment: Judgment is impulsive.    Review of Systems  Psychiatric/Behavioral:  The patient is nervous/anxious.   All other systems reviewed and are negative.  Blood pressure 117/76, pulse 95, temperature 97.9 F (36.6 C), resp. rate 20, height 5\' 5"  (1.651 m), weight 103.4 kg, SpO2 99%. Body mass index is 37.93 kg/m.   Treatment Plan Summary: Daily contact with patient to assess and evaluate symptoms and progress in treatment and Medication management Depade (Naltrexone)  50 mg daily to help reduce SIB urges Propranolol 10 mg nightly to manage anxiety symptoms and physiological responses (tachycardia, restlessness) Buspirone (Buspar) 10mg  BID - Anxiolytic for Generalized Anxiety Disorder and social anxiety. Aripiprazole (Abilify) 15mg  nightly - Atypical antipsychotic for mood stabilization and impulse control. Trazodone 50mg  nightly - Serotonin antagonist and reuptake inhibitor (SARI) for insomnia. Venlafaxine (Effexor) 150mg  in the AM - SNRI for depression, anxiety, and emotional regulation. Metformin 500mg  - Used for metabolic support, often prescribed for weight management or insulin resistance. Monitor every 15 minutes for self-injurious behavior (SIB) and suicide risk. Implement safety planning. oordinate with outpatient psychiatrist and therapist to ensure continuity of care post-discharge. Consider DBT-based outpatient therapy for emotional regulation and impulsivity management. Myriam Forehand, NP 11/19/2023, 7:53 PM

## 2023-11-19 NOTE — Progress Notes (Signed)
   11/19/23 2300  Psych Admission Type (Psych Patients Only)  Admission Status Voluntary  Psychosocial Assessment  Patient Complaints Worrying  Eye Contact Brief  Facial Expression Sad;Flat  Affect Depressed  Speech Soft  Interaction Minimal  Motor Activity Tremors  Appearance/Hygiene Unremarkable  Behavior Characteristics Cooperative;Appropriate to situation  Mood Pleasant  Thought Process  Coherency WDL  Content WDL  Delusions None reported or observed  Perception WDL  Hallucination None reported or observed  Judgment Poor  Confusion None  Danger to Self  Current suicidal ideation? Denies  Agreement Not to Harm Self Yes  Description of Agreement verbal  Danger to Others  Danger to Others None reported or observed

## 2023-11-19 NOTE — Progress Notes (Signed)
   11/19/23 0112  Psych Admission Type (Psych Patients Only)  Admission Status Voluntary  Psychosocial Assessment  Patient Complaints None  Eye Contact Brief  Facial Expression Flat  Affect Depressed  Speech Soft  Interaction Minimal  Motor Activity Slow  Appearance/Hygiene Unremarkable  Behavior Characteristics Cooperative  Mood Pleasant  Aggressive Behavior  Effect No apparent injury  Thought Process  Coherency WDL  Content WDL  Delusions None reported or observed  Perception WDL  Hallucination None reported or observed  Judgment Impaired  Confusion None  Danger to Self  Current suicidal ideation? Denies  Danger to Others  Danger to Others None reported or observed

## 2023-11-19 NOTE — Plan of Care (Signed)

## 2023-11-19 NOTE — Group Note (Signed)
 Date:  11/19/2023 Time:  8:54 PM  Group Topic/Focus:  Wrap-Up Group:   The focus of this group is to help patients review their daily goal of treatment and discuss progress on daily workbooks.    Participation Level:  Active  Participation Quality:  Appropriate  Affect:  Appropriate  Cognitive:  Appropriate  Insight: Good  Engagement in Group:  Engaged  Modes of Intervention:  Discussion  Additional Comments:    Antonio [name] 11/19/2023, 8:54 PM

## 2023-11-19 NOTE — Progress Notes (Signed)
   11/19/23 1600  Psych Admission Type (Psych Patients Only)  Admission Status Voluntary  Psychosocial Assessment  Patient Complaints None  Eye Contact Brief  Facial Expression Sad  Affect Depressed  Speech Soft  Interaction Minimal  Motor Activity Slow  Appearance/Hygiene Unremarkable  Behavior Characteristics Cooperative  Mood Pleasant  Aggressive Behavior  Effect No apparent injury  Thought Process  Coherency WDL  Content WDL  Delusions None reported or observed  Perception WDL  Hallucination None reported or observed  Judgment Impaired  Confusion None  Danger to Self  Current suicidal ideation? Denies  Danger to Others  Danger to Others None reported or observed

## 2023-11-19 NOTE — Plan of Care (Signed)
   Problem: Education: Goal: Knowledge of [place] [facility] Education information/materials will improve Outcome: Progressing Goal: Emotional status will improve Outcome: Progressing

## 2023-11-19 NOTE — Plan of Care (Signed)
   Problem: Education: Goal: Emotional status will improve Outcome: Progressing   Problem: Education: Goal: Mental status will improve Outcome: Progressing

## 2023-11-20 DIAGNOSIS — F332 Major depressive disorder, recurrent severe without psychotic features: Secondary | ICD-10-CM | POA: Diagnosis not present

## 2023-11-20 MED ORDER — MELATONIN 5 MG PO TABS: 5.0000 mg | ORAL_TABLET | Freq: Every day | ORAL | 0 refills | Status: AC

## 2023-11-20 NOTE — Discharge Summary (Signed)
 Physician Discharge Summary Note  Patient:  Antonio [name] is an 18 y.o., adult MRN:  [id number] DOB:  Aug 04, 2006 Patient phone:  [phone] (home)  Patient address:   [address] [place] [facility] [address],  Total Time spent with patient: 2 hours  Date of Admission:  11/14/2023 Date of Discharge: 11/20/2023  Reason for Admission:   18 year old transgender male with a history of Major Depressive Disorder (MDD), Borderline Personality Disorder (BPD), multiple suicide attempts, social anxiety, and deliberate self-harm. He presented to the Emergency Department (ED) on 11/14/2023 at 1:39 PM after a self-inflicted deep cut to his left forearm, which required sutures.Upon evaluation, the patient reported that the act was impulsive and without a specific trigger. He denied feeling depressed or anxious at the time but admitted to using a knife to cut himself. After self-harming, he called the police himself, stating that he wanted care and support. The patient denied any altercations or conflicts with others, and he described his parents as supportive.The patient has a history of multiple self-inflicted injuries and impulsive behaviors, which place him at continued risk for further self-harm. While he has been compliant with his outpatient medications (including Aripiprazole, Trazodone, Venlafaxine, Naltrexone, and Buspirone) and is engaged in weekly therapy, his persistent impulsivity and history of recurrent self-harm warrant further evaluation in an inpatient setting. At the time of admission, he was calm, cooperative, and engaged in meaningful conversation. He expressed uncertainty about why he self-harmed but acknowledged that it may have been an impulse control issue. The patient denied suicidal ideation (SI), homicidal ideation (HI), and auditory/visual hallucinations (AVH). He also reported poor sleep the night before but was able to sleep after taking his prescribed medication.Given his ongoing risk  of self-harm, impulsivity, and psychiatric history, the patient met criteria for inpatient psychiatric hospitalization for stabilization, continued evaluation, and management of his self-harm behaviors.  Principal Problem: MDD (major depressive disorder), recurrent severe, without psychosis (HCC) Discharge Diagnoses: Principal Problem:   MDD (major depressive disorder), recurrent severe, without psychosis (HCC) Active Problems:   Gender dysphoria   Deliberate self-cutting   Suicide attempt by cutting of wrist (HCC)   Transgender man on hormone therapy   Borderline personality disorder (HCC)   Suicide attempt [facility])   Past Psychiatric History: see below  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   Asthma    Bupropion overdose 01/16/2020   Complication of anesthesia    gets disoriented and shakes after surgery   Deliberate self-cutting 10/17/2018   Depression    Eczema    Fracture of 5th metatarsal    Gender dysphoria    Intentional overdose of drug in tablet form (HCC) 04/03/2020   MDD (major depressive disorder), recurrent severe, without psychosis (HCC) 11/02/2021   Moderate episode of recurrent major depressive disorder (HCC) 04/24/2020   Multiple allergies    Severe episode of recurrent major depressive disorder, without psychotic features (HCC) 01/02/2020   Suicidal ideations 08/31/2022   Suicide attempt by cutting of wrist (HCC) 07/28/2022    Past Surgical History:  Procedure Laterality Date   SUPPRELIN IMPLANT Left 01/22/2019   Procedure: SUPPRELIN IMPLANT;  Surgeon: Kandice Hams, MD;  Location: [facility];  Service: Pediatrics;  Laterality: Left;   SUPPRELIN REMOVAL N/A 03/03/2020   Procedure: SUPPRELIN REMOVAL;  Surgeon: Kandice Hams, MD;  Location: [facility];  Service: Pediatrics;  Laterality: N/A;  TONSILLECTOMY      TYMPANOSTOMY TUBE PLACEMENT     Family History:  Family History  Problem Relation Age of Onset   Depression Mother    Anxiety disorder Mother    Hypertension Maternal Grandmother    Allergic rhinitis Neg Hx    Angioedema Neg Hx    Atopy Neg Hx    Eczema Neg Hx    Immunodeficiency Neg Hx    Urticaria Neg Hx    Family Psychiatric  History: see above Social History:  Social History   Substance and Sexual Activity  Alcohol Use No     Social History   Substance and Sexual Activity  Drug Use Not Currently   Types: Marijuana    Social History   Socioeconomic History   Marital status: Single    Spouse name: Not on file   Number of children: Not on file   Years of education: Not on file   Highest education level: 12th grade  Occupational History   Not on file  Tobacco Use   Smoking status: Never   Smokeless tobacco: Never  Vaping Use   Vaping status: Former   Substances: Nicotine, Flavoring   Devices: Nicotine  Substance and Sexual Activity   Alcohol use: No   Drug use: Not Currently    Types: Marijuana   Sexual activity: Not Currently  Other Topics Concern   Not on file  Social History Narrative   Lives with mom and stepdad.      He is in 12th grade at [employer] 23-24 school year      He enjoys writing and listen to music, writes poetry   Social Drivers of Health   Financial Resource Strain: Patient Declined (08/29/2023)   Overall Financial Resource Strain (CARDIA)    Difficulty of Paying Living Expenses: Patient declined  Food Insecurity: No Food Insecurity (11/15/2023)   Hunger Vital Sign    Worried About Running Out of Food in the Last Year: Never true    Ran Out of Food in the Last Year: Never true  Transportation Needs: No Transportation Needs (11/15/2023)   PRAPARE - Administrator, Civil Service (Medical): No    Lack of Transportation (Non-Medical): No  Recent Concern: Transportation Needs - Unmet Transportation Needs  (08/29/2023)   PRAPARE - Transportation    Lack of Transportation (Medical): Yes    Lack of Transportation (Non-Medical): Yes  Physical Activity: Unknown (08/29/2023)   Exercise Vital Sign    Days of Exercise per Week: 0 days    Minutes of Exercise per Session: Not on file  Stress: Stress Concern Present (08/29/2023)   [employer] of Occupational Health - Occupational Stress Questionnaire    Feeling of Stress : Very much  Social Connections: Socially Isolated (08/29/2023)   Social Connection and Isolation Panel [NHANES]    Frequency of Communication with Friends and Family: Once a week    Frequency of Social Gatherings with Friends and Family: Never    Attends Religious Services: Never    Database administrator or Organizations: No    Attends Engineer, structural: Not on file    Marital Status: Never married    Hospital Course:  18 year old transgender male was admitted to inpatient psychiatry following presentation to the Emergency Department (ED) on 11/14/2023 at 1:39 PM for a self-inflicted deep cut to the left forearm, which required sutures. The patient reported that the act was impulsive and not triggered by a specific event. Despite denying  feelings of depression or anxiety, his history of multiple suicide attempts and deliberate self-harm warranted inpatient stabilization.Upon admission, the patient was calm, cooperative, and engaged in meaningful conversation. He denied suicidal ideation (SI), homicidal ideation (HI), and auditory/visual hallucinations (AVH). He expressed no ongoing distress but admitted uncertainty about why he cut himself, attributing it to impulse control issues.The patient was continued on his home medication regimen.The patient remained engaged in treatment, participated in group therapy, and demonstrated insight into his behaviors.He continued to engage in individual therapy sessions and was encouraged to utilize coping skills rather than  self-injury.He acknowledged support from his parents and reported no conflicts at home. He denied further urges to self-harm and expressed willingness to continue outpatient therapy upon discharge.The patient remained calm and cooperative throughout hospitalization.No aggression, mood instability, or acute psychiatric distress was observed.He continued to deny SI/HI/AVH and expressed motivation to follow up with outpatient providers.His sleep improved with medication adherence.No self-injurious behavior (SIB) was noted during hospitalization.The patient met the maximum hospitalization criteria and was deemed appropriate for discharge based on his stabilization, denial of self-harm urges, and engagement in treatment.    Musculoskeletal: Strength & Muscle Tone: within normal limits Gait & Station: normal Patient leans: N/A   Psychiatric Specialty Exam:  Presentation  General Appearance:  Well Groomed  Eye Contact: Good  Speech: Clear and Coherent; Garbled  Speech Volume: Normal (Calm, cooperative)  Handedness: Right   Mood and Affect  Mood: -- (Neutral,)  Affect: Appropriate; Congruent   Thought Process  Thought Processes: Coherent; Goal Directed  Descriptions of Associations:Intact  Orientation:Full (Time, Place and Person) (and situation)  Thought Content:Logical; WDL  History of Schizophrenia/Schizoaffective disorder:No  Duration of Psychotic Symptoms:N/A (none noted)  Hallucinations:Hallucinations: None  Ideas of Reference:None  Suicidal Thoughts:Suicidal Thoughts: No  Homicidal Thoughts:Homicidal Thoughts: No   Sensorium  Memory: Immediate Good; Recent Good; Remote Good  Judgment: Fair  Insight: Fair   Art therapist  Concentration: Fair  Attention Span: Fair  Recall: Good  Fund of Knowledge: Good  Language: Good   Psychomotor Activity  Psychomotor Activity: Psychomotor Activity: Normal   Assets   Assets: Communication Skills; Financial Resources/Insurance; Housing; Resilience; Social Support   Sleep  Sleep: Sleep: Good Number of Hours of Sleep: 6    Physical Exam: Physical Exam Vitals and nursing note reviewed.  Constitutional:      Appearance: Normal appearance.  HENT:     Head: Normocephalic and atraumatic.     Nose: Nose normal.  Pulmonary:     Effort: Pulmonary effort is normal.  Musculoskeletal:        General: Normal range of motion.     Cervical back: Normal range of motion.  Neurological:     General: No focal deficit present.     Mental Status: He is alert and oriented to person, place, and time. Mental status is at baseline.  Psychiatric:        Attention and Perception: Attention and perception normal.        Mood and Affect: Mood normal. Affect is flat.        Speech: Speech normal.        Behavior: Behavior normal. Behavior is cooperative.        Cognition and Memory: Cognition and memory normal.        Judgment: Judgment normal.    Review of Systems  Skin:        Bilateral dressing on forearm, sutures intact  All other systems reviewed and are negative.  Blood  pressure 133/86, pulse 87, temperature 97.7 F (36.5 C), resp. rate 18, height 5\' 5"  (1.651 m), weight 103.4 kg, SpO2 99%. Body mass index is 37.93 kg/m.   Social History   Tobacco Use  Smoking Status Never  Smokeless Tobacco Never   Tobacco Cessation:  N/A, patient does not currently use tobacco products   Blood Alcohol level:  Lab Results  Component Value Date   ETH <10 11/14/2023   ETH <10 08/19/2023    Metabolic Disorder Labs:  Lab Results  Component Value Date   HGBA1C 5.1 12/30/2022   MPG 99.67 12/30/2022   MPG 105 09/01/2022   Lab Results  Component Value Date   PROLACTIN 10.0 09/01/2022   PROLACTIN 10.3 04/16/2022   Lab Results  Component Value Date   CHOL 200 (H) 12/30/2022   TRIG 114 12/30/2022   HDL 40 (L) 12/30/2022   CHOLHDL 5.0 12/30/2022    VLDL 23 12/30/2022   LDLCALC 137 (H) 12/30/2022   LDLCALC 170 (H) 09/01/2022    See Psychiatric Specialty Exam and Suicide Risk Assessment completed by Attending Physician prior to discharge.  Discharge destination:  Home  Is patient on multiple antipsychotic therapies at discharge:  No   Has Patient had three or more failed trials of antipsychotic monotherapy by history:  No  Recommended Plan for Multiple Antipsychotic Therapies: NA   Allergies as of 11/20/2023       Reactions   Egg-derived Products Anaphylaxis   Justicia Adhatoda Anaphylaxis, Other (See Comments)   All "TREE NUTS"   Peanut-containing Drug Products Anaphylaxis        Medication List     STOP taking these medications    betamethasone dipropionate 0.05 % ointment Commonly known as: DIPROLENE       TAKE these medications      Indication  albuterol 108 (90 Base) MCG/ACT inhaler Commonly known as: VENTOLIN HFA Inhale 1-2 puffs into the lungs every 4 (four) hours as needed for wheezing or shortness of breath.  Indication: Asthma   ARIPiprazole 15 MG tablet Commonly known as: ABILIFY Take 1 tablet (15 mg total) by mouth at bedtime. What changed: when to take this  Indication: Major Depressive Disorder   busPIRone 10 MG tablet Commonly known as: BUSPAR Take 1 tablet (10 mg total) by mouth 2 (two) times daily.  Indication: Anxiety Disorder, Major Depressive Disorder   EPINEPHrine 0.3 mg/0.3 mL Soaj injection Commonly known as: EpiPen 2-Pak Inject 0.3 mg into the muscle as needed for anaphylaxis (AS DIRECTED).  Indication: Life-Threatening Hypersensitivity Reaction   Ibuprofen 200 MG Caps Take 200-800 mg by mouth every 6 (six) hours as needed (for pain or headaches).  Indication: Pain   melatonin 5 MG Tabs Take 1 tablet (5 mg total) by mouth at bedtime.  Indication: Trouble Sleeping   metFORMIN 500 MG tablet Commonly known as: GLUCOPHAGE TAKE 1 TABLET BY MOUTH 2 TIMES DAILY WITH A MEAL.   Indication: Body Weight Gain due to Antipsychotic Medication Use, OBESITY   naltrexone 50 MG tablet Commonly known as: DEPADE Take 1 tablet (50 mg total) by mouth daily.  Indication: self harm and impulsivity.   omeprazole 40 MG capsule Commonly known as: PRILOSEC Take 40 mg by mouth daily before breakfast.  Indication: Gastroesophageal Reflux Disease   Pataday 0.1 % ophthalmic solution Generic drug: olopatadine Place 1 drop into both eyes 3 (three) times daily as needed for allergies.  Indication: dry eyes   propranolol 10 MG tablet Commonly known as: INDERAL Take  1 tablet (10 mg total) by mouth at bedtime.  Indication: Feeling Anxious   Testosterone Cypionate 200 MG/ML Soln Inject weekly under the skin as directed What changed:  how to take this when to take this additional instructions  Indication: Transgender Man   traZODone 50 MG tablet Commonly known as: DESYREL Take 1-2 tablets (50-100 mg total) by mouth at bedtime as needed for sleep.  Indication: Trouble Sleeping, Major Depressive Disorder   triamcinolone ointment 0.1 % Commonly known as: KENALOG Apply topically 2 (two) times daily as needed (eczema).  Indication: Allergic Contact Dermatitis   venlafaxine XR 150 MG 24 hr capsule Commonly known as: Effexor XR Take 1 capsule (150 mg total) by mouth daily with breakfast.  Indication: Major Depressive Disorder, Social Anxiety Disorder        Follow-up Information     [facility]. Go to.   Why: Telehealth medication management apppointment 11/24/23 at 10 AM w/ Dr. Lorenso Quarry, MD.        Counseling, [facility] [place]. Go to.   Why: In person appointment 03/14 at 5 PM w/ Normajean Glasgow. Contact information: [address] [place] [address] [phone]                 Follow-up recommendations:  Activity:  as tolerated Diet:  heart healthy  Comments:   Effexor XR 150 mg once daily  manage symptoms of Major  Depressive Disorder (MDD) and anxiety by increasing serotonin and norepinephrine levels. Abilify 15 mg once daily at bedtime to stabilize mood, reduce impulsivity, and help with emotional dysregulation associated with Borderline Personality Disorder (BPD). Buspar 10 mg twice daily manage social anxiety and generalized anxiety Atarax 25 mg every 6 hours PRN for anxiety Trazodone 50-150 mg QHS PRN for sleep difficulties Propranolol 10 mg daily at bedtime manage  anxiety-related physical symptoms Naltrexone 50 mg daily (reduce self-harm urges and compulsive behaviors  Metformin 500 mg daily with breakfast to manage metabolic side effects  Suicide Prevention Hotline: 988 (available 24/7) Local Crisis Response Team: Contact information provided Crisis Text Line: Text HOME to [phone] Follow up with Primary Care Physician (PCP) Assess overall physical health post-injury and hospitalization. Monitor wound healing from sutured self-inflicted cut. Evaluate for any nutritional or metabolic concerns related to obesity and medication side effects. Follow up with Endocrinologist within 7 days Continue management of hormone therapy for gender-affirming treatment. Defer hormone management to the endocrinologist for monitoring and any necessary adjustment Follow up with Dr. Jennette Bill within 7 daysEnsure continuity of psychiatric care and review medication regimen, including any necessary adjustment  Signed: Myriam Forehand, NP 11/20/2023, 10:57 AM

## 2023-11-20 NOTE — Progress Notes (Signed)
 Pt was discharge to home via transportation by a family member.  AVS and transition record reviewed with patient.  Pt belongings were returned to pt's satisfaction.  Pt denied SI and HI.    11/20/23 1100  Charting Type  Charting Type Shift assessment  Safety Check Verification  Has the RN verified the 15 minute safety check completion? Yes  Neurological  Neuro (WDL) WDL  HEENT  HEENT (WDL) WDL  Respiratory  Respiratory (WDL) WDL  Cardiac  Cardiac (WDL) WDL  Vascular  Vascular (WDL) WDL  Integumentary  Integumentary (WDL) X  Braden Scale (Ages 8 and up)  Sensory Perceptions 4  Moisture 4  Activity 4  Mobility 4  Nutrition 3  Friction and Shear 3  Braden Scale Score 22  Musculoskeletal  Musculoskeletal (WDL) WDL  Gastrointestinal  Gastrointestinal (WDL) WDL  GU Assessment  Genitourinary (WDL) WDL  Neurological  Level of Consciousness Alert

## 2023-11-20 NOTE — BHH Suicide Risk Assessment (Signed)
 [facility] Discharge Suicide Risk Assessment   Principal Problem: MDD (major depressive disorder), recurrent severe, without psychosis (HCC) Discharge Diagnoses: Principal Problem:   MDD (major depressive disorder), recurrent severe, without psychosis (HCC) Active Problems:   Gender dysphoria   Deliberate self-cutting   Suicide attempt by cutting of wrist (HCC)   Transgender man on hormone therapy   Borderline personality disorder (HCC)   Suicide attempt (HCC)   Total Time spent with patient: 2 hours  Musculoskeletal: Strength & Muscle Tone: within normal limits Gait & Station: normal Patient leans: N/A  Psychiatric Specialty Exam  Presentation  General Appearance:  Appropriate for Environment; Fairly Groomed  Eye Contact: Good  Speech: Clear and Coherent; Normal Rate  Speech Volume: Decreased  Handedness: Right   Mood and Affect  Mood: Depressed  Duration of Depression Symptoms: No data recorded Affect: Flat   Thought Process  Thought Processes: Coherent; Goal Directed  Descriptions of Associations:Intact  Orientation:Full (Time, Place and Person)  Thought Content:Logical  History of Schizophrenia/Schizoaffective disorder:No  Duration of Psychotic Symptoms:N/A  Hallucinations:No data recorded Ideas of Reference:None  Suicidal Thoughts:No data recorded Homicidal Thoughts:No data recorded  Sensorium  Memory: Immediate Good; Recent Good; Remote Good  Judgment: Fair  Insight: Fair   Art therapist  Concentration: Fair  Attention Span: Fair  Recall: Good  Fund of Knowledge: Good  Language: Good   Psychomotor Activity  Psychomotor Activity:No data recorded  Assets  Assets: Communication Skills; Housing; Financial Resources/Insurance   Sleep  Sleep:No data recorded  Physical Exam: Physical Exam Vitals and nursing note reviewed.  HENT:     Head: Normocephalic and atraumatic.     Nose: Nose normal.  Pulmonary:      Effort: Pulmonary effort is normal.  Musculoskeletal:        General: Normal range of motion.     Cervical back: Normal range of motion.  Skin:    Comments: Sutures on left and right forearm with dressings   Neurological:     General: No focal deficit present.     Mental Status: He is oriented to person, place, and time. Mental status is at baseline.  Psychiatric:        Attention and Perception: Attention and perception normal.        Mood and Affect: Mood normal. Affect is flat.        Speech: Speech normal.        Behavior: Behavior normal. Behavior is cooperative.        Thought Content: Thought content normal.        Cognition and Memory: Cognition and memory normal.        Judgment: Judgment is impulsive.    Review of Systems  Skin:        Sutures on left and right forearm with dressings   All other systems reviewed and are negative.  Blood pressure 133/86, pulse 87, temperature 97.7 F (36.5 C), resp. rate 18, height 5\' 5"  (1.651 m), weight 103.4 kg, SpO2 99%. Body mass index is 37.93 kg/m.  Mental Status Per Nursing Assessment::   On Admission:  Suicidal ideation indicated by patient  Demographic Factors:  Male, Adolescent or young adult, Gay, lesbian, or bisexual orientation, and Unemployed  Loss Factors: Loss of significant relationship  Historical Factors: Prior suicide attempts, Family history of mental illness or substance abuse, and Impulsivity  Risk Reduction Factors:   Sense of responsibility to family, Living with another person, especially a relative, and Positive social support  Continued Clinical Symptoms:  Depression:   Impulsivity Insomnia Personality Disorders:   Cluster B Medical Diagnoses and Treatments/Surgeries  Cognitive Features That Contribute To Risk:  None    Suicide Risk:  Minimal: No identifiable suicidal ideation.  Patients presenting with no risk factors but with morbid ruminations; may be classified as minimal risk based on  the severity of the depressive symptoms   Follow-up Information     [facility]. Go to.   Why: Telehealth medication management apppointment 11/24/23 at 10 AM w/ Dr. Lorenso Quarry, MD.        Counseling, [place]. Go to.   Why: In person appointment 03/14 at 5 PM w/ Normajean Glasgow. Contact information: [address] [place] [address] [phone]                 Plan Of Care/Follow-up recommendations:  Activity:  as tolerated Diet:  heart healthy Effexor XR 150 mg once daily  manage symptoms of Major Depressive Disorder (MDD) and anxiety by increasing serotonin and norepinephrine levels. Abilify 15 mg once daily at bedtime to stabilize mood, reduce impulsivity, and help with emotional dysregulation associated with Borderline Personality Disorder (BPD). Buspar 10 mg twice daily manage social anxiety and generalized anxiety Atarax 25 mg every 6 hours PRN for anxiety Trazodone 50-150 mg QHS PRN for sleep difficulties Propranolol 10 mg daily at bedtime manage  anxiety-related physical symptoms Naltrexone 50 mg daily (reduce self-harm urges and compulsive behaviors  Metformin 500 mg daily with breakfast to manage metabolic side effects  Suicide Prevention Hotline: 988 (available 24/7) Local Crisis Response Team: Contact information provided Crisis Text Line: Text HOME to [phone] Follow up with Primary Care Physician (PCP) Assess overall physical health post-injury and hospitalization. Monitor wound healing from sutured self-inflicted cut. Evaluate for any nutritional or metabolic concerns related to obesity and medication side effects. Follow up with Endocrinologist within 7 days Continue management of hormone therapy for gender-affirming treatment. Defer hormone management to the endocrinologist for monitoring and any necessary adjustment Follow up with Dr. Jennette Bill within 7 daysEnsure continuity of psychiatric care and review medication  regimen, including any necessary adjustmen Myriam Forehand, NP 11/20/2023, 10:39 AM

## 2023-11-20 NOTE — Plan of Care (Signed)
   Problem: Education: Goal: Emotional status will improve Outcome: Progressing   Problem: Education: Goal: Mental status will improve Outcome: Progressing

## 2023-11-20 NOTE — Progress Notes (Signed)
  [facility] Adult Case Management Discharge Plan :  Will you be returning to the same living situation after discharge:  Yes,  [address]  [address] At discharge, do you have transportation home?: Yes,  The patient stated that mom. Do you have the ability to pay for your medications: Yes,  The patient stated insurance through [facility]  Release of information consent forms completed and in the chart;  Patient's signature needed at discharge.  Patient to Follow up at:  Follow-up Information     [facility]. Go to.   Why: Telehealth medication management apppointment 11/24/23 at 10 AM w/ Dr. Lorenso Quarry, MD.        [facility] [place]. Go to.   Why: In person appointment 03/14 at 5 PM w/ Normajean Glasgow. Contact information: [address] [place] [address] [phone]                 Next level of care provider has access to [facility] Link:yes  Safety Planning and Suicide Prevention discussed: Yes,  Theressa Noble/mother [phone])     Has patient been referred to the Quitline?: Patient does not use tobacco/nicotine products  Patient has been referred for addiction treatment: Yes, the patient will follow up with an outpatient provider for substance use disorder. Psychiatrist/APP: appointment made  Marshell Levan, LCSW 11/20/2023, 10:38 AM

## 2023-11-24 ENCOUNTER — Other Ambulatory Visit: Payer: Self-pay

## 2023-11-24 ENCOUNTER — Encounter (HOSPITAL_COMMUNITY): Payer: Self-pay | Admitting: Emergency Medicine

## 2023-11-24 ENCOUNTER — Telehealth: Payer: 59 | Admitting: Child and Adolescent Psychiatry

## 2023-11-24 ENCOUNTER — Ambulatory Visit (HOSPITAL_COMMUNITY): Admission: EM | Admit: 2023-11-24 | Discharge: 2023-11-24 | Disposition: A | Discharge status: Home / Self Care

## 2023-11-24 DIAGNOSIS — F411 Generalized anxiety disorder: Secondary | ICD-10-CM

## 2023-11-24 DIAGNOSIS — Z4802 Encounter for removal of sutures: Secondary | ICD-10-CM

## 2023-11-24 DIAGNOSIS — F4010 Social phobia, unspecified: Secondary | ICD-10-CM | POA: Diagnosis not present

## 2023-11-24 DIAGNOSIS — F649 Gender identity disorder, unspecified: Secondary | ICD-10-CM | POA: Diagnosis not present

## 2023-11-24 DIAGNOSIS — F840 Autistic disorder: Secondary | ICD-10-CM | POA: Diagnosis not present

## 2023-11-24 DIAGNOSIS — F39 Unspecified mood [affective] disorder: Secondary | ICD-10-CM

## 2023-11-24 MED ORDER — PROPRANOLOL HCL 10 MG PO TABS: 10.0000 mg | ORAL_TABLET | Freq: Every day | ORAL | 0 refills | Status: DC

## 2023-11-24 MED ORDER — ARIPIPRAZOLE 15 MG PO TABS: 15.0000 mg | ORAL_TABLET | Freq: Every day | ORAL | 0 refills | Status: DC

## 2023-11-24 MED ORDER — TRAZODONE HCL 50 MG PO TABS: 50.0000 mg | ORAL_TABLET | Freq: Every evening | ORAL | 0 refills | Status: DC | PRN

## 2023-11-24 MED ORDER — VENLAFAXINE HCL ER 37.5 MG PO CP24: ORAL_CAPSULE | ORAL | 0 refills | Status: DC

## 2023-11-24 MED ORDER — ESCITALOPRAM OXALATE 5 MG PO TABS: ORAL_TABLET | ORAL | 0 refills | Status: DC

## 2023-11-24 NOTE — Discharge Instructions (Signed)
 You were seen today for suture removal.  Your wound appears to be healing well.  You can continue to use bacitracin/Neosporin over the area for wound healing.  If any concern for infection develops, seek further evaluation.

## 2023-11-24 NOTE — Progress Notes (Unsigned)
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 this time. The patient was educated about relevant modifiable risk factors including following recommendations for treatment of psychiatric illness and abstaining from substance abuse. While future psychiatric events cannot be accurately predicted, the patient does not request acute inpatient psychiatric care and does not currently meet [facility] involuntary commitment criteria.    This note was generated in part or whole with voice recognition software. Voice recognition is usually quite accurate but there are transcription errors that can and very often do occur. I apologize for any typographical errors that were not detected and corrected.             [name], MD 11/24/2023, 5:23 PM

## 2023-11-24 NOTE — ED Provider Notes (Signed)
 [facility]    CSN: [id number] Arrival date & time: 11/24/23  1724      History   Chief Complaint Chief Complaint  Patient presents with   Suture / Staple Removal    HPI Antonio [name] is a 18 y.o. adult.  Patient presents to the urgent care today with concerns of suture removal.  Patient was seen at the emergency department 10 days ago for laceration repair.  Intentional self-harm.  Patient denies any complications with the wound.  Denies any premature tears or breaks of the suture site.   Suture / Staple Removal    Past Medical History:  Diagnosis Date   Allergic rhinoconjunctivitis 06/06/2015   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   Asthma    Bupropion overdose 01/16/2020   Complication of anesthesia    gets disoriented and shakes after surgery   Deliberate self-cutting 10/17/2018   Depression    Eczema    Fracture of 5th metatarsal    Gender dysphoria    Intentional overdose of drug in tablet form (HCC) 04/03/2020   MDD (major depressive disorder), recurrent severe, without psychosis (HCC) 11/02/2021   Moderate episode of recurrent major depressive disorder (HCC) 04/24/2020   Multiple allergies    Severe episode of recurrent major depressive disorder, without psychotic features (HCC) 01/02/2020   Suicidal ideations 08/31/2022   Suicide attempt by cutting of wrist (HCC) 07/28/2022    Patient Active Problem List   Diagnosis Date Noted   Suicide attempt (HCC) 11/14/2023   Obesity (BMI 30-39.9) 09/01/2023   Borderline personality disorder (HCC) 08/19/2023   MDD (major depressive disorder), recurrent episode, severe (HCC) 08/19/2023   Daytime somnolence 05/26/2023   Transgender man on hormone therapy 05/26/2023   Suicidal ideations 02/09/2023   MDD (major depressive disorder), recurrent severe, without psychosis (HCC) 01/02/2023   Suicide attempt by cutting of wrist (HCC) 07/28/2022   Autism spectrum disorder requiring support (level 1)  05/01/2021   Acne vulgaris 07/31/2020   Insulin resistance 10/17/2018   Deliberate self-cutting 10/17/2018   Social anxiety disorder 10/17/2018   Gender dysphoria    Intrinsic atopic dermatitis 06/06/2015   Asthma 08/23/2011    Class: Diagnosis of    Past Surgical History:  Procedure Laterality Date   SUPPRELIN IMPLANT Left 01/22/2019   Procedure: SUPPRELIN IMPLANT;  Surgeon: Kandice Hams, MD;  Location: [facility];  Service: Pediatrics;  Laterality: Left;   SUPPRELIN REMOVAL N/A 03/03/2020   Procedure: SUPPRELIN REMOVAL;  Surgeon: Kandice Hams, MD;  Location: [facility];  Service: Pediatrics;  Laterality: N/A;   TONSILLECTOMY     TYMPANOSTOMY TUBE PLACEMENT      OB History   No obstetric history on file.      Home Medications    Prior to Admission medications   Medication Sig Start Date End Date Taking? Authorizing Provider  albuterol (VENTOLIN HFA) 108 (90 Base) MCG/ACT inhaler Inhale 1-2 puffs into the lungs every 4 (four) hours as needed for wheezing or shortness of breath. 06/08/23   Marcelyn Bruins, MD  ARIPiprazole (ABILIFY) 15 MG tablet Take 1 tablet (15 mg total) by mouth at bedtime. 11/24/23 12/24/23  [name], MD  busPIRone (BUSPAR) 10 MG tablet Take 1 tablet (10 mg total) by mouth 2 (two) times daily. 10/24/23   [name], MD  EPINEPHrine (EPIPEN 2-PAK) 0.3 mg/0.3 mL IJ SOAJ injection Inject 0.3 mg into the muscle as needed for anaphylaxis (AS DIRECTED). 06/08/23  Marcelyn Bruins, MD  escitalopram (LEXAPRO) 5 MG tablet Take 1 tablet (5 mg total) by mouth daily for 14 days, THEN 2 tablets (10 mg total) daily for 14 days. 11/24/23 12/22/23  [name], MD  Ibuprofen 200 MG CAPS Take 200-800 mg by mouth every 6 (six) hours as needed (for pain or headaches).    [provider]  melatonin 5 MG TABS Take 1 tablet (5 mg total) by mouth at bedtime. 11/20/23 12/20/23  Myriam Forehand, NP  metFORMIN  (GLUCOPHAGE) 500 MG tablet TAKE 1 TABLET BY MOUTH 2 TIMES DAILY WITH A MEAL. 10/18/23   [name], MD  naltrexone (DEPADE) 50 MG tablet Take 1 tablet (50 mg total) by mouth daily. 07/18/23   [name], MD  omeprazole (PRILOSEC) 40 MG capsule Take 40 mg by mouth daily before breakfast.    [provider]  PATADAY 0.1 % ophthalmic solution Place 1 drop into both eyes 3 (three) times daily as needed for allergies.    [provider]  propranolol (INDERAL) 10 MG tablet Take 1 tablet (10 mg total) by mouth at bedtime. 11/24/23 12/24/23  [name], MD  Testosterone Cypionate 200 MG/ML SOLN Inject weekly under the skin as directed Patient taking differently: Inject into the skin See admin instructions. Inject every Sunday under the skin as directed 09/04/23   Nestor Ramp, MD  traZODone (DESYREL) 50 MG tablet Take 1-2 tablets (50-100 mg total) by mouth at bedtime as needed for sleep. 11/24/23 12/24/23  [name], MD  triamcinolone ointment (KENALOG) 0.1 % Apply topically 2 (two) times daily as needed (eczema). 09/23/23   Nestor Ramp, MD  venlafaxine XR (EFFEXOR XR) 37.5 MG 24 hr capsule Take 3 capsules (112.5 mg total) by mouth daily with breakfast for 7 days, THEN 2 capsules (75 mg total) daily with breakfast for 7 days, THEN 1 capsule (37.5 mg total) daily with breakfast for 7 days. 12/01/23 12/22/23  [name], MD    Family History Family History  Problem Relation Age of Onset   Depression Mother    Anxiety disorder Mother    Hypertension Maternal Grandmother    Allergic rhinitis Neg Hx    Angioedema Neg Hx    Atopy Neg Hx    Eczema Neg Hx    Immunodeficiency Neg Hx    Urticaria Neg Hx     Social History Social History   Tobacco Use   Smoking status: Never   Smokeless tobacco: Never  Vaping Use   Vaping status: Former   Substances: Nicotine, Flavoring   Devices: Nicotine  Substance Use Topics   Alcohol use: No   Drug use: Not Currently     Types: Marijuana     Allergies   Egg-derived products, Justicia adhatoda, and Peanut-containing drug products   Review of Systems Review of Systems  Skin:  Positive for wound.  All other systems reviewed and are negative.    Physical Exam Triage Vital Signs ED Triage Vitals  Encounter Vitals Group     BP 11/24/23 1744 132/80     Systolic BP Percentile --      Diastolic BP Percentile --      Pulse Rate 11/24/23 1744 (!) 108     Resp 11/24/23 1744 16     Temp 11/24/23 1744 99.2 F (37.3 C)     Temp Source 11/24/23 1744 Oral     SpO2 11/24/23 1744 96 %  Weight --      Height --      Head Circumference --      Peak Flow --      Pain Score 11/24/23 1743 0     Pain Loc --      Pain Education --      Exclude from Growth Chart --    No data found.  Updated Vital Signs BP 132/80 (BP Location: Right Arm)   Pulse (!) 108   Temp 99.2 F (37.3 C) (Oral)   Resp 16   SpO2 96%   Visual Acuity Right Eye Distance:   Left Eye Distance:   Bilateral Distance:    Right Eye Near:   Left Eye Near:    Bilateral Near:     Physical Exam Vitals and nursing note reviewed.  Constitutional:      General: He is not in acute distress.    Appearance: He is well-developed.  HENT:     Head: Normocephalic and atraumatic.  Eyes:     Conjunctiva/sclera: Conjunctivae normal.  Cardiovascular:     Rate and Rhythm: Normal rate and regular rhythm.     Heart sounds: No murmur heard. Pulmonary:     Effort: Pulmonary effort is normal. No respiratory distress.     Breath sounds: Normal breath sounds.  Abdominal:     Palpations: Abdomen is soft.     Tenderness: There is no abdominal tenderness.  Musculoskeletal:        General: No swelling.     Cervical back: Neck supple.  Skin:    General: Skin is warm and dry.     Capillary Refill: Capillary refill takes less than 2 seconds.     Findings: Lesion present.     Comments: Well-healing lesion to the left forearm.  No wound dehiscence  present.  No evidence drainage, erythema, or swelling.  Neurological:     Mental Status: He is alert.  Psychiatric:        Mood and Affect: Mood normal.      UC Treatments / Results  Labs (all labs ordered are listed, but only abnormal results are displayed) Labs Reviewed - No data to display  EKG   Radiology No results found.  Procedures Procedures (including critical care time)  Medications Ordered in UC Medications - No data to display  Initial Impression / Assessment and Plan / UC Course  I have reviewed the triage vital signs and the nursing notes.  Pertinent labs & imaging results that were available during my care of the patient were reviewed by me and considered in my medical decision making (see chart for details).     Patient presents to urgent care today for suture removal.  Patient reportedly had received sutures for closure of a left forearm laceration.  He reportedly had sustained this laceration and a self-harm attempt.  The sutures were placed about 10 days ago.  Denies any concerns with the wound healing over the last 10 days.  No evidence of infection including erythema, induration, purulent drainage, or severe pain.  Physical exam is reassuring.  The wound appears to be well-approximated.  No wound dehiscence and no opening of wound margin with slight traction.  Sutures all accounted for and in place.  Will proceed with removal.  Patient tolerated removal with no difficulty. Will discharge with continued monitoring of wound to ensure the area is able to continue healing properly. Return precautions advised. Patient discharged home in stable condition. Final Clinical Impressions(s) / UC  Diagnoses   Final diagnoses:  Visit for suture removal     Discharge Instructions      You were seen today for suture removal.  Your wound appears to be healing well.  You can continue to use bacitracin/Neosporin over the area for wound healing.  If any concern for  infection develops, seek further evaluation.     ED Prescriptions   None    PDMP not reviewed this encounter.   Smitty Knudsen, PA-C 11/24/23 1758

## 2023-11-24 NOTE — ED Triage Notes (Signed)
 Sutures placed on 3/3 at [facility] long.  Sutures to left forearm

## 2023-11-26 NOTE — Progress Notes (Deleted)
 08/29/23- [name] courtesy of Dr Denny Levy with concern of hypersomnolence, complicated by F2M Transgender, Autism, Depression, Hx Suicide Attempt/ Self Harm, Allergic Rhinitis, Asthma, Food Allergy GERD Followed by [facility]. -Trazodone 50-100 HS,  also Abilify, Naltrexone Epworth score -20 Body weight today-231 lbs      Weight up 60 lbs last 2 years  Discussed the use of AI scribe software for clinical note transcription with the patient, who gave verbal consent to proceed.  History of Present Illness   The patient, with a history of sleep apnea in early childhood, presents with chronic insomnia and excessive daytime sleepiness. He reports difficulty staying awake during the day, often necessitating naps, but denies falling asleep during activities such as driving. Despite this, he notes that naps do not provide lasting relief from his fatigue. At night, he experiences frequent awakenings, approximately every 30 minutes to two hours, but these episodes are brief, lasting only about a minute. He also reports occasional nightmares, occurring a few times a month. Past attempts to manage his sleep issues with melatonin and trazodone have been unsuccessful; while these medications helped him fall asleep initially, they did not prevent the frequent nighttime awakenings. The patient denies experiencing sleep paralysis, cataplexy, or hypnagogic hallucinations. He also denies any significant snoring or observed apneas during sleep. He has a history of tonsillectomy and is currently on testosterone therapy, which he believes may have contributed to recent weight gain.     Assessment and Plan:    Insomnia   Longstanding difficulty with sleep initiation and maintenance. No improvement with melatonin or trazodone. Reports frequent nocturnal awakenings and daytime sleepiness. No history of sleepwalking or sleep-related eating disorder. No cataplexy or hypnagogic hallucinations. History of sleep  apnea in early childhood.   -Schedule nocturnal polysomnogram to evaluate for sleep apnea and other sleep disorders.   -Follow-up appointment in approximately three months, or sooner depending on the results of the sleep study.    Weight Gain   Significant 60 lb weight gain over the past two years. Patient is on testosterone, which may contribute to weight gain. Patient reports lack of energy to exercise.   -Encourage patient to monitor weight and discuss any concerns or changes with his primary care provider.     11/28/23-[name] followed for  hypersomnolence, complicated by F2M Transgender, Autism, Depression, Hx Suicide Attempt/ Self Harm, Allergic Rhinitis, Asthma, Food Allergy GERD Followed by [facility]. 11/14/23- [facility]- self inflicted laceration L arm. -Trazodone 50-100 HS,  also Abilify, Naltrexone Sleep study not yet done??  ROS-see HPI   + = positive Constitutional:    +weight gain, night sweats, fevers, chills, fatigue, lassitude. HEENT:    +headaches, difficulty swallowing, tooth/dental problems, sore throat,       sneezing, itching, ear ache, nasal congestion, post nasal drip, snoring CV:    chest pain, orthopnea, PND, swelling in lower extremities, anasarca,                                   dizziness, palpitations Resp:   +shortness of breath with exertion or at rest.                productive cough,   non-productive cough, coughing up of blood.              change in color of mucus.  wheezing.   Skin:  rash or lesions. GI: +heartburn, indigestion, +abdominal pain, nausea, vomiting, diarrhea,                 change in bowel habits, loss of appetite GU: dysuria, change in color of urine, no urgency or frequency.   flank pain. MS:   +joint pain, stiffness, decreased range of motion, back pain. Neuro-     nothing unusual Psych:  change in mood or affect.  +depression or anxiety.   memory loss.  OBJ- Physical Exam General- Alert,  Oriented, Affect-appropriate, Distress- none acute, +obese Skin- rash-none, lesions- none, excoriation- none, + piercings Lymphadenopathy- none Head- atraumatic            Eyes- Gross vision intact, PERRLA, conjunctivae and secretions clear            Ears- Hearing, canals-normal            Nose- Clear, no-Septal dev, mucus, polyps, erosion, perforation             Throat- Mallampati IV , mucosa clear , drainage- none, tonsils- atrophic, +[id number] Neck- flexible , trachea midline, no stridor , thyroid nl, carotid no bruit Chest - symmetrical excursion , unlabored           Heart/CV- RRR , no murmur , no gallop  , no rub, nl s1 s2                           - JVD- none , edema- none, stasis changes- none, varices- none           Lung- clear to P&A, wheeze- none, cough- none , dullness-none, rub- none           Chest wall-  Abd-  Br/ Gen/ Rectal- Not done, not indicated Extrem- cyanosis- none, clubbing, none, atrophy- none, strength- nl Neuro- grossly intact to observation

## 2023-11-28 ENCOUNTER — Ambulatory Visit: Payer: 59 | Admitting: Internal Medicine

## 2023-11-28 ENCOUNTER — Telehealth: Payer: Self-pay | Admitting: Internal Medicine

## 2023-11-28 DIAGNOSIS — R0683 Snoring: Secondary | ICD-10-CM

## 2023-11-28 NOTE — Telephone Encounter (Signed)
 Patient's mother (DPR) is calling for a sleeps study for the patient. The patient has regular [facility].Please call patient's mother at  [phone]

## 2023-11-28 NOTE — Telephone Encounter (Signed)
 Mother would also like to know CPT Code for split sleep study for insurance purposes. It may need prior auth for insurance.

## 2023-12-02 ENCOUNTER — Encounter: Payer: Self-pay | Admitting: Family Medicine

## 2023-12-07 ENCOUNTER — Other Ambulatory Visit: Payer: Self-pay | Admitting: Child and Adolescent Psychiatry

## 2023-12-08 NOTE — Telephone Encounter (Signed)
 Dr. Maple Hudson, The in lab sleep study is not covered by insurance, however a HST is covered.  Please advise if you want to order a HST.  Please route back to triage.  Thank you.

## 2023-12-11 ENCOUNTER — Other Ambulatory Visit: Payer: Self-pay | Admitting: Child and Adolescent Psychiatry

## 2023-12-13 NOTE — Telephone Encounter (Signed)
 At out December visit we had agreed to order a sleep study. His insurance won't cover an on-center overnight study. Would he want to self-pay for a home sleep test for dx snoring? If so please schedule.

## 2023-12-14 ENCOUNTER — Other Ambulatory Visit: Payer: Self-pay | Admitting: Child and Adolescent Psychiatry

## 2023-12-14 NOTE — Telephone Encounter (Signed)
 Dr. Maple Hudson, I have sent a message to Avel Sensor (Patient's mother, on DPR) to see if she would like to proceed with the home sleep test.  We will let you know when we hear back from her regarding the HST.  Thank you.

## 2023-12-17 ENCOUNTER — Other Ambulatory Visit: Payer: Self-pay | Admitting: Family Medicine

## 2023-12-19 ENCOUNTER — Telehealth (INDEPENDENT_AMBULATORY_CARE_PROVIDER_SITE_OTHER): Admitting: Child and Adolescent Psychiatry

## 2023-12-19 ENCOUNTER — Other Ambulatory Visit: Payer: Self-pay | Admitting: Child and Adolescent Psychiatry

## 2023-12-19 DIAGNOSIS — F840 Autistic disorder: Secondary | ICD-10-CM | POA: Diagnosis not present

## 2023-12-19 DIAGNOSIS — F4010 Social phobia, unspecified: Secondary | ICD-10-CM | POA: Diagnosis not present

## 2023-12-19 DIAGNOSIS — F3341 Major depressive disorder, recurrent, in partial remission: Secondary | ICD-10-CM | POA: Diagnosis not present

## 2023-12-19 DIAGNOSIS — F411 Generalized anxiety disorder: Secondary | ICD-10-CM

## 2023-12-19 DIAGNOSIS — F649 Gender identity disorder, unspecified: Secondary | ICD-10-CM

## 2023-12-19 MED ORDER — BUSPIRONE HCL 10 MG PO TABS: 10.0000 mg | ORAL_TABLET | Freq: Two times a day (BID) | ORAL | 1 refills | Status: DC

## 2023-12-19 MED ORDER — VENLAFAXINE HCL ER 37.5 MG PO CP24: 37.5000 mg | ORAL_CAPSULE | Freq: Every day | ORAL | 0 refills | Status: DC

## 2023-12-19 MED ORDER — TRAZODONE HCL 50 MG PO TABS: 50.0000 mg | ORAL_TABLET | Freq: Every evening | ORAL | 0 refills | Status: DC | PRN

## 2023-12-19 NOTE — Progress Notes (Unsigned)
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 for any typographical errors that were not detected and corrected.             [name], MD 12/19/2023, 5:35 PM

## 2023-12-19 NOTE — Telephone Encounter (Signed)
 Called patients mother, Avel Sensor.  Patient does want to proceed with the HST.  Will order a HST per Dr. Maple Hudson as insurance will not pay for an overnight in lab sleep test. Patients mother verbalized understanding.  Order placed.

## 2024-01-02 ENCOUNTER — Encounter

## 2024-01-02 DIAGNOSIS — R0683 Snoring: Secondary | ICD-10-CM

## 2024-01-04 ENCOUNTER — Ambulatory Visit: Payer: 59 | Admitting: Family Medicine

## 2024-01-04 VITALS — BP 108/74 | HR 93 | Ht 65.0 in | Wt 228.0 lb

## 2024-01-04 DIAGNOSIS — R052 Subacute cough: Secondary | ICD-10-CM | POA: Diagnosis not present

## 2024-01-04 DIAGNOSIS — F640 Transsexualism: Secondary | ICD-10-CM

## 2024-01-04 DIAGNOSIS — Z79899 Other long term (current) drug therapy: Secondary | ICD-10-CM | POA: Diagnosis not present

## 2024-01-04 DIAGNOSIS — K219 Gastro-esophageal reflux disease without esophagitis: Secondary | ICD-10-CM | POA: Diagnosis not present

## 2024-01-04 MED ORDER — OMEPRAZOLE 40 MG PO CPDR: 40.0000 mg | DELAYED_RELEASE_CAPSULE | Freq: Two times a day (BID) | ORAL | 1 refills | Status: DC

## 2024-01-04 NOTE — Patient Instructions (Signed)
 Let us  double the omeperazole to twice a day. Please send me a My Chart in 2 weeks or so with update on night time cough.We will go from there. Let me see you in 3 months.

## 2024-01-04 NOTE — Progress Notes (Signed)
    CHIEF COMPLAINT / HPI: Follow-up gender affirming hormone therapy: Currently pretty satisfied with the transition so far.  Would like to get phalloplasty and is exploring options for surgical intervention. 2.  Awakening most nights with a cough in the middle of the night.  This is new.  Has continued on once daily omeprazole  for chronic reflux symptoms and not having any breakthrough symptoms that are identifiable. 3.  Also has a lot of postnasal drip and drainage with allergies.  Worse this year.  Continues on some allergy  medicine intermittently which is helpful. 4.  Mood seems pretty good right now.  He has been helping his grandmother after she had foot surgery.  Keeping him occupied in time structured.  This is a first of 2 foot surgeries for her.   PERTINENT  PMH / PSH: I have reviewed the patient's medications, allergies, past medical and surgical history, smoking status and updated in the EMR as appropriate.   OBJECTIVE:  BP 108/74   Pulse 93   Ht 5\' 5"  (1.651 m)   Wt 228 lb (103.4 kg)   SpO2 98%   BMI 37.94 kg/m    ASSESSMENT / PLAN:  #1.  Nighttime cough: May be related to long history of reflux.  Will increase omeprazole  to twice daily.  If not having improvement in 2 weeks, he will let me know and we will add famotidine .  He has had previous EGD and colonoscopy by pediatric gastroenterology.  For not getting some improvement, will need to refer him to adult GI for possible endoscopy. 2.  Nighttime cough could also be related to allergic rhinitis but less likely.  Will continue current medications for allergies. 3.  Transgender man: Discussed surgical interventions.  He is exploring bottom surgery.  Reviewed recent CBC which shows hematocrit and hemoglobin stable.  Follow-up 3 months.  No medication changes right now. No problem-specific Assessment & Plan notes found for this encounter.   Violetta Grice MD

## 2024-01-09 ENCOUNTER — Telehealth: Payer: Self-pay

## 2024-01-09 MED ORDER — ESCITALOPRAM OXALATE 10 MG PO TABS: 10.0000 mg | ORAL_TABLET | Freq: Every day | ORAL | 1 refills | Status: DC

## 2024-01-09 NOTE — Telephone Encounter (Signed)
 I have sent a prescription for lexapro  10 mg daily. Can you please check with her if she was asking for this prescription or any other prescription? Thanks

## 2024-01-09 NOTE — Telephone Encounter (Signed)
 pt mother left message that you did not give enough pills / or you need to send in rx with the new dosage. pt was las seen on 4-7 net appt 5-5

## 2024-01-09 NOTE — Telephone Encounter (Signed)
 pt mother was notified that rx was sent to the pharmacy. pt mother states that was the only rx

## 2024-01-13 DIAGNOSIS — G4733 Obstructive sleep apnea (adult) (pediatric): Secondary | ICD-10-CM | POA: Diagnosis not present

## 2024-01-16 ENCOUNTER — Telehealth (INDEPENDENT_AMBULATORY_CARE_PROVIDER_SITE_OTHER): Admitting: Child and Adolescent Psychiatry

## 2024-01-16 DIAGNOSIS — F4010 Social phobia, unspecified: Secondary | ICD-10-CM

## 2024-01-16 DIAGNOSIS — F3341 Major depressive disorder, recurrent, in partial remission: Secondary | ICD-10-CM | POA: Diagnosis not present

## 2024-01-16 DIAGNOSIS — F840 Autistic disorder: Secondary | ICD-10-CM

## 2024-01-16 DIAGNOSIS — F649 Gender identity disorder, unspecified: Secondary | ICD-10-CM

## 2024-01-16 DIAGNOSIS — F411 Generalized anxiety disorder: Secondary | ICD-10-CM

## 2024-01-16 MED ORDER — TRAZODONE HCL 50 MG PO TABS: 50.0000 mg | ORAL_TABLET | Freq: Every evening | ORAL | 0 refills | Status: DC | PRN

## 2024-01-16 MED ORDER — ARIPIPRAZOLE 15 MG PO TABS: 15.0000 mg | ORAL_TABLET | Freq: Every day | ORAL | 0 refills | Status: DC

## 2024-01-16 MED ORDER — PROPRANOLOL HCL 10 MG PO TABS: 10.0000 mg | ORAL_TABLET | Freq: Every day | ORAL | 0 refills | Status: DC

## 2024-01-16 MED ORDER — NALTREXONE HCL 50 MG PO TABS
50.0000 mg | ORAL_TABLET | Freq: Every day | ORAL | 1 refills | Status: DC
Start: 2024-01-16 — End: 2024-04-27
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
## 2024-01-21 ENCOUNTER — Encounter: Payer: Self-pay | Admitting: Family Medicine

## 2024-01-28 ENCOUNTER — Emergency Department (EMERGENCY_DEPARTMENT_HOSPITAL)
Admission: EM | Admit: 2024-01-28 | Discharge: 2024-01-28 | Disposition: A | Discharge status: Other Acute Inpatient Hospital | Source: Home / Self Care | Attending: Emergency Medicine | Admitting: Emergency Medicine

## 2024-01-28 ENCOUNTER — Inpatient Hospital Stay (HOSPITAL_COMMUNITY)
Admission: AD | Admit: 2024-01-28 | Discharge: 2024-02-01 | DRG: 885 | Disposition: A | Discharge status: Home / Self Care | Source: Intra-hospital | Attending: Psychiatry | Admitting: Psychiatry

## 2024-01-28 ENCOUNTER — Other Ambulatory Visit: Payer: Self-pay

## 2024-01-28 ENCOUNTER — Encounter (HOSPITAL_COMMUNITY): Payer: Self-pay | Admitting: Psychiatry

## 2024-01-28 ENCOUNTER — Encounter (HOSPITAL_COMMUNITY): Payer: Self-pay

## 2024-01-28 DIAGNOSIS — Z9151 Personal history of suicidal behavior: Secondary | ICD-10-CM | POA: Diagnosis not present

## 2024-01-28 DIAGNOSIS — Z818 Family history of other mental and behavioral disorders: Secondary | ICD-10-CM | POA: Diagnosis not present

## 2024-01-28 DIAGNOSIS — F603 Borderline personality disorder: Secondary | ICD-10-CM | POA: Diagnosis present

## 2024-01-28 DIAGNOSIS — J45909 Unspecified asthma, uncomplicated: Secondary | ICD-10-CM | POA: Diagnosis present

## 2024-01-28 DIAGNOSIS — F339 Major depressive disorder, recurrent, unspecified: Secondary | ICD-10-CM | POA: Diagnosis present

## 2024-01-28 DIAGNOSIS — F411 Generalized anxiety disorder: Secondary | ICD-10-CM | POA: Diagnosis present

## 2024-01-28 DIAGNOSIS — L309 Dermatitis, unspecified: Secondary | ICD-10-CM | POA: Diagnosis present

## 2024-01-28 DIAGNOSIS — S61512A Laceration without foreign body of left wrist, initial encounter: Secondary | ICD-10-CM | POA: Insufficient documentation

## 2024-01-28 DIAGNOSIS — S51812A Laceration without foreign body of left forearm, initial encounter: Secondary | ICD-10-CM | POA: Insufficient documentation

## 2024-01-28 DIAGNOSIS — R45851 Suicidal ideations: Principal | ICD-10-CM

## 2024-01-28 DIAGNOSIS — F649 Gender identity disorder, unspecified: Secondary | ICD-10-CM | POA: Diagnosis not present

## 2024-01-28 DIAGNOSIS — Z91018 Allergy to other foods: Secondary | ICD-10-CM | POA: Diagnosis not present

## 2024-01-28 DIAGNOSIS — T1491XA Suicide attempt, initial encounter: Secondary | ICD-10-CM | POA: Diagnosis not present

## 2024-01-28 DIAGNOSIS — F640 Transsexualism: Secondary | ICD-10-CM | POA: Diagnosis present

## 2024-01-28 DIAGNOSIS — G4733 Obstructive sleep apnea (adult) (pediatric): Secondary | ICD-10-CM | POA: Diagnosis present

## 2024-01-28 DIAGNOSIS — Z7289 Other problems related to lifestyle: Secondary | ICD-10-CM

## 2024-01-28 DIAGNOSIS — F4010 Social phobia, unspecified: Secondary | ICD-10-CM | POA: Diagnosis present

## 2024-01-28 DIAGNOSIS — Z79899 Other long term (current) drug therapy: Secondary | ICD-10-CM

## 2024-01-28 DIAGNOSIS — Z91010 Allergy to peanuts: Secondary | ICD-10-CM

## 2024-01-28 DIAGNOSIS — Z7984 Long term (current) use of oral hypoglycemic drugs: Secondary | ICD-10-CM | POA: Diagnosis not present

## 2024-01-28 DIAGNOSIS — F840 Autistic disorder: Secondary | ICD-10-CM | POA: Insufficient documentation

## 2024-01-28 DIAGNOSIS — Z9152 Personal history of nonsuicidal self-harm: Secondary | ICD-10-CM

## 2024-01-28 DIAGNOSIS — X781XXA Intentional self-harm by knife, initial encounter: Secondary | ICD-10-CM | POA: Insufficient documentation

## 2024-01-28 DIAGNOSIS — Z91012 Allergy to eggs: Secondary | ICD-10-CM | POA: Diagnosis not present

## 2024-01-28 DIAGNOSIS — F332 Major depressive disorder, recurrent severe without psychotic features: Principal | ICD-10-CM | POA: Diagnosis present

## 2024-01-28 DIAGNOSIS — Z8249 Family history of ischemic heart disease and other diseases of the circulatory system: Secondary | ICD-10-CM

## 2024-01-28 DIAGNOSIS — Z87891 Personal history of nicotine dependence: Secondary | ICD-10-CM

## 2024-01-28 DIAGNOSIS — Y92009 Unspecified place in unspecified non-institutional (private) residence as the place of occurrence of the external cause: Secondary | ICD-10-CM | POA: Insufficient documentation

## 2024-01-28 DIAGNOSIS — F19959 Other psychoactive substance use, unspecified with psychoactive substance-induced psychotic disorder, unspecified: Secondary | ICD-10-CM | POA: Diagnosis present

## 2024-01-28 LAB — COMPREHENSIVE METABOLIC PANEL WITH GFR
ALT: 19 U/L (ref 0–44)
AST: 17 U/L (ref 15–41)
Albumin: 4.3 g/dL (ref 3.5–5.0)
Alkaline Phosphatase: 63 U/L (ref 38–126)
Anion gap: 13 (ref 5–15)
BUN: 9 mg/dL (ref 6–20)
CO2: 22 mmol/L (ref 22–32)
Calcium: 9.4 mg/dL (ref 8.9–10.3)
Chloride: 101 mmol/L (ref 98–111)
Creatinine, Ser: 1.22 mg/dL (ref 0.61–1.24)
GFR, Estimated: >60 mL/min (ref >60)
Glucose, Bld: 85 mg/dL (ref 70–99)
Potassium: 3.8 mmol/L (ref 3.5–5.1)
Sodium: 136 mmol/L (ref 135–145)
Total Bilirubin: 0.7 mg/dL (ref 0.0–1.2)
Total Protein: 8 g/dL (ref 6.5–8.1)

## 2024-01-28 LAB — CBC
HCT: 45.4 % (ref 39.0–52.0)
Hemoglobin: 14.3 g/dL (ref 13.0–17.0)
MCH: 24.6 pg — ABNORMAL LOW (ref 26.0–34.0)
MCHC: 31.5 g/dL (ref 30.0–36.0)
MCV: 78.1 fL — ABNORMAL LOW (ref 80.0–100.0)
Platelets: 311 10*3/uL (ref 150–400)
RBC: 5.81 MIL/uL (ref 4.22–5.81)
RDW: 14.4 % (ref 11.5–15.5)
WBC: 6.8 10*3/uL (ref 4.0–10.5)
nRBC: 0 % (ref 0.0–0.2)

## 2024-01-28 LAB — ETHANOL: Alcohol, Ethyl (B): <15 mg/dL (ref <15)

## 2024-01-28 LAB — HCG, SERUM, QUALITATIVE: Preg, Serum: NEGATIVE

## 2024-01-28 LAB — RAPID URINE DRUG SCREEN, HOSP PERFORMED
Amphetamines: NOT DETECTED
Barbiturates: NOT DETECTED
Benzodiazepines: NOT DETECTED
Cocaine: NOT DETECTED
Opiates: NOT DETECTED
Tetrahydrocannabinol: NOT DETECTED

## 2024-01-28 MED ORDER — TRAZODONE HCL 50 MG PO TABS
50.0000 mg | ORAL_TABLET | Freq: Every evening | ORAL | Status: DC | PRN
  Administered 2024-01-29 – 2024-01-31 (×3): 50 mg via ORAL
  Filled 2024-01-28 (×3): qty 1

## 2024-01-28 MED ORDER — OLANZAPINE 5 MG PO TBDP: 5.0000 mg | ORAL_TABLET | Freq: Three times a day (TID) | ORAL | Status: DC | PRN

## 2024-01-28 MED ORDER — MAGNESIUM HYDROXIDE 400 MG/5ML PO SUSP: 30.0000 mL | Freq: Every day | ORAL | Status: DC | PRN

## 2024-01-28 MED ORDER — OLANZAPINE 10 MG IM SOLR: 5.0000 mg | Freq: Three times a day (TID) | INTRAMUSCULAR | Status: DC | PRN

## 2024-01-28 MED ORDER — ALUM & MAG HYDROXIDE-SIMETH 200-200-20 MG/5ML PO SUSP
30.0000 mL | ORAL | Status: DC | PRN
  Administered 2024-01-31: 30 mL via ORAL
  Filled 2024-01-28: qty 30

## 2024-01-28 MED ORDER — OLANZAPINE 10 MG IM SOLR: 10.0000 mg | Freq: Three times a day (TID) | INTRAMUSCULAR | Status: DC | PRN

## 2024-01-28 MED ORDER — ARIPIPRAZOLE 15 MG PO TABS
15.0000 mg | ORAL_TABLET | Freq: Every day | ORAL | Status: DC
Start: 2024-01-28 — End: 2024-02-01
  Administered 2024-01-29 – 2024-01-31 (×3): 15 mg via ORAL
  Filled 2024-01-28 (×4): qty 1

## 2024-01-28 MED ORDER — ACETAMINOPHEN 325 MG PO TABS
650.0000 mg | ORAL_TABLET | Freq: Four times a day (QID) | ORAL | Status: DC | PRN
  Administered 2024-01-31: 650 mg via ORAL
  Filled 2024-01-28: qty 2

## 2024-01-28 NOTE — ED Notes (Signed)
 Notified MD Pollina and PA-C Rebekah on patients suicide attempt and Laceration to the LEFT wrist/forearm. Wound irrigated for Provider to assess. Charge nurse [employer] of situation.

## 2024-01-28 NOTE — ED Triage Notes (Signed)
 Pt arrived POV from home c/o suicidal thoughts that started about an hour ago. Pt does not have a current plan.

## 2024-01-28 NOTE — Tx Team (Signed)
 Initial Treatment Plan 01/28/2024 2:17 PM Antonio [name] ZOX:[id number]    PATIENT STRESSORS: Financial difficulties   Health problems   Marital or family conflict     PATIENT STRENGTHS: Ability for insight  Communication skills  Supportive family/friends    PATIENT IDENTIFIED PROBLEMS: "To maintain my routine"  "Work on communicating better with my family"  Suicidal ideation  Depression  Ineffective coping skills             DISCHARGE CRITERIA:  Ability to meet basic life and health needs Adequate post-discharge living arrangements Safe-care adequate arrangements made  PRELIMINARY DISCHARGE PLAN: Attend aftercare/continuing care group Outpatient therapy Return to previous living arrangement  PATIENT/FAMILY INVOLVEMENT: This treatment plan has been presented to and reviewed with the patient, Antonio [name], and/or family member.  The patient and family have been given the opportunity to ask questions and make suggestions.  Antonio Easterly, RN 01/28/2024, 2:17 PM

## 2024-01-28 NOTE — ED Notes (Signed)
 Upon initial encounter with patient, Patient dressed out in hospital approved scrubs. Personal item inventoried with security. Patients mother at bedside. Upon assessment of patient the left wrist/forearm has a approximately 5 inch laceration noted. Bleeding controlled. When asked the patient admits that the laceration was self inflicted with a knife. Patient admits to [facility] with most recent attempt at approximately 0200-0300 tonight. Denies HI/AVH at this time.

## 2024-01-28 NOTE — Plan of Care (Signed)
   Problem: Education: Goal: Knowledge of [facility] Education information/materials will improve Outcome: Progressing Goal: Emotional status will improve Outcome: Progressing Goal: Mental status will improve Outcome: Progressing Goal: Verbalization of understanding the information provided will improve Outcome: Progressing

## 2024-01-28 NOTE — ED Notes (Signed)
 PA-C Rebekah assessed laceration to patients Left forearm, not going to require stitches per PA, This RN dressed wound in non adherent xeroform and wrapped wound with Gerardo Knoll dressing and coban. Patient denies pain at this time.

## 2024-01-28 NOTE — Group Note (Signed)
 LCSW Group Therapy Note   Type of Therapy and Topic:  Group Therapy - Safety  Participation Level:  Active   Description of Group This process group involved patients discussing the situations or people in their lives that frequently make them safe or unsafe.  Anxiety was a common factor among all group participants and many of them described home situations that keep them on edge and not able to feel completely safe.  Three questions were addressed during the group:  (1) What makes you feel safe (or unsafe)?  (2) Do you feel safe with yourself and why?  (3) If you don't feel safe, what can you do?  A lengthy discussion ensued in which group members empathized with each other, gave suggestions to one another, and expressed their feelings freely.  Therapeutic Goals Patient will describe what makes them feel safe or unsafe in their everyday lives. Patient will think about and discuss whether they feel safe with themselves and what reasons might contribute to feeling safe or unsafe. Patients will participate in planning for what can be done to help themselves feel safer.   Summary of Patient Progress:  The patient share that "safety to me is with my therapist, my dog, and my parents. I feel safe writing poetry, journaling, and listening to music. I feel safe I n my room, in the hospital, my therapist's office. I enjoy the beach and being at the mall.   Therapeutic Modalities Cognitive Behavioral Therapy  Caera Enwright O Merriam Brandner, LCSWA 01/28/2024  4:15 PM

## 2024-01-28 NOTE — ED Notes (Signed)
 Voluntary consent signed for inpatient services at [facility].

## 2024-01-28 NOTE — ED Notes (Signed)
 Report provided to [facility] RN via telephone. [facility] [employer] called with 20 min. ETA.   All patient belongings gathered and prepared for transport.

## 2024-01-28 NOTE — [facility] Group Notes (Signed)
 [facility] Group Notes:  (Nursing/MHT/Case Management/Adjunct)  Date:  01/28/2024  Time: 2000  Type of Therapy:  Wrap up group  Participation Level:  Active  Participation Quality:  Appropriate, Attentive, Sharing, and Supportive  Affect:  Depressed and Flat  Cognitive:  Alert  Insight:  Improving  Engagement in Group:  Engaged  Modes of Intervention:  Clarification, Education, and Support  Summary of Progress/Problems: Positive thinking and positive change were discussed.   Catharine Clock 01/28/2024, 9:41 PM

## 2024-01-28 NOTE — Progress Notes (Signed)
 Admission Note: Patient is a 18 year old male to male transgender who is admitted to the unit voluntarily from [facility] for suicidal ideation with a laceration to his left forearm due to unspecified stressor. Patient stated, "I have multiple history of SI and SIB.  This cut is not deep."  Stated goal is to maintain my routine and to learn to communicate better with my parents.  Patient presents with a flat affect and depressed mood.  Admission plan of care reviewed, consent signed.  Skin and personal belongings completed.  Laceration noted on left forearm.  Old scar from breast removal for sex change assignment noted.  Prefers "He" and "They" pronoun.  No contraband noted.  Patient oriented to the unit, staff and room.  Routine safety checks initiated.  Verbalizes understanding of unit rules/protocols.  Patient is safe on the unit.

## 2024-01-28 NOTE — ED Notes (Signed)
 Increased rounding implemented due to no sitter available. Pt bed positioned in sight of care team station. Pt resting comfortably at this time. Pt is calm, cooperative, no complaints.

## 2024-01-28 NOTE — ED Notes (Signed)
 Staffing office contacted again and reports that no sitter is available at this time.

## 2024-01-28 NOTE — ED Notes (Signed)
 V/S being obtained at this time. All belongings gathered. [facility] [employer] arrived at ED.

## 2024-01-28 NOTE — ED Notes (Signed)
 All belongings given to [facility].

## 2024-01-28 NOTE — ED Notes (Signed)
 Voluntary consent faxed to [facility]. Pt provided lunch tray. Pt sitting upright and eating. Pt calm, cooperative.

## 2024-01-28 NOTE — ED Provider Notes (Signed)
  [facility] AT [facility] Provider Note   CSN: [id number] Arrival date & time: 01/28/24  0414     History  Chief Complaint  Patient presents with   Suicidal    Antonio [name] is a 18 y.o. adult transgender male who presents with suicide attempt.  Patient with history of autism spectrum disorder with history of suicide attempt and ideation in the past.  Presents with mother at the bedside with concern for suicide attempt with left wrist laceration with pocket knife at home this morning.  He went to his mother who brought him to the emergency department.  Mother states that this behavior is intermittent and consistent with patient's psychiatric baseline.  Patient denies HI or AVH.  Patient's mother also states that all of the medications in the home are locked and the patient had no access to any medications or weapons to the mother's knowledge.  HPI     Home Medications Prior to Admission medications   Medication Sig Start Date End Date Taking? Authorizing Provider  albuterol  (VENTOLIN  HFA) 108 (90 Base) MCG/ACT inhaler Inhale 1-2 puffs into the lungs every 4 (four) hours as needed for wheezing or shortness of breath. 06/08/23   Brian Campanile, MD  ARIPiprazole  (ABILIFY ) 15 MG tablet Take 1 tablet (15 mg total) by mouth at bedtime. 01/16/24 02/15/24  Umrania, [name], MD  busPIRone  (BUSPAR ) 10 MG tablet Take 1 tablet (10 mg total) by mouth 2 (two) times daily. 12/19/23   Umrania, [name], MD  EPINEPHrine  (EPIPEN  2-PAK) 0.3 mg/0.3 mL IJ SOAJ injection Inject 0.3 mg into the muscle as needed for anaphylaxis (AS DIRECTED). 06/08/23   Brian Campanile, MD  escitalopram  (LEXAPRO ) 10 MG tablet Take 1 tablet (10 mg total) by mouth daily. 01/09/24   Pilar Bridge, MD  Ibuprofen  200 MG CAPS Take 200-800 mg by mouth every 6 (six) hours as needed (for pain or headaches).    [provider]  metFORMIN  (GLUCOPHAGE ) 500 MG tablet TAKE 1 TABLET BY  MOUTH 2 TIMES DAILY WITH A MEAL. 10/18/23   Pilar Bridge, MD  naltrexone  (DEPADE) 50 MG tablet Take 1 tablet (50 mg total) by mouth daily. 01/16/24   Umrania, [name], MD  omeprazole  (PRILOSEC) 40 MG capsule Take 1 capsule (40 mg total) by mouth in the morning and at bedtime. 01/04/24   Charise Companion, MD  PATADAY  0.1 % ophthalmic solution Place 1 drop into both eyes 3 (three) times daily as needed for allergies.    [provider]  propranolol  (INDERAL ) 10 MG tablet Take 1 tablet (10 mg total) by mouth at bedtime. 01/16/24 02/15/24  Umrania, [name], MD  Testosterone  Cypionate 200 MG/ML SOLN Inject weekly under the skin as directed Patient taking differently: Inject into the skin See admin instructions. Inject every Sunday under the skin as directed 09/04/23   Charise Companion, MD  traZODone  (DESYREL ) 50 MG tablet Take 1-2 tablets (50-100 mg total) by mouth at bedtime as needed for sleep. 01/16/24 02/15/24  Umrania, [name], MD  triamcinolone  ointment (KENALOG ) 0.1 % APPLY TOPICALLY 2 (TWO) TIMES DAILY AS NEEDED (ECZEMA). 12/19/23   Charise Companion, MD      Allergies    Egg-derived products, Justicia adhatoda, and Peanut-containing drug products    Review of Systems   Review of Systems  Psychiatric/Behavioral:  Positive for self-injury and suicidal ideas.     Physical Exam Updated Vital Signs BP (!) 143/95 (BP  Location: Right Arm)   Pulse 87   Temp 98.6 F (37 C)   Resp 16   Ht 5\' 5"  (1.651 m)   Wt 104.3 kg   SpO2 96%   BMI 38.27 kg/m  Physical Exam Vitals and nursing note reviewed.  Constitutional:      Appearance: He is obese. He is not ill-appearing or toxic-appearing.  HENT:     Head: Normocephalic and atraumatic.     Mouth/Throat:     Mouth: Mucous membranes are moist.     Pharynx: No oropharyngeal exudate or posterior oropharyngeal erythema.  Eyes:     General:        Right eye: No discharge.        Left eye: No discharge.     Conjunctiva/sclera: Conjunctivae normal.   Cardiovascular:     Rate and Rhythm: Normal rate and regular rhythm.     Pulses: Normal pulses.     Heart sounds: Normal heart sounds. No murmur heard. Pulmonary:     Effort: Pulmonary effort is normal. No respiratory distress.     Breath sounds: Normal breath sounds. No wheezing or rales.  Abdominal:     General: Bowel sounds are normal. There is no distension.     Palpations: Abdomen is soft.     Tenderness: There is no abdominal tenderness.  Musculoskeletal:        General: No deformity.       Arms:     Cervical back: Neck supple.  Skin:    General: Skin is warm and dry.     Capillary Refill: Capillary refill takes less than 2 seconds.  Neurological:     General: No focal deficit present.     Mental Status: He is alert. Mental status is at baseline.  Psychiatric:        Mood and Affect: Mood normal. Affect is flat.        Behavior: Behavior is withdrawn. Behavior is cooperative.        Thought Content: Thought content includes suicidal ideation. Thought content does not include homicidal ideation. Thought content includes suicidal plan.     Comments: Patient does not appear to be responding to internal stimuli at this time.     ED Results / Procedures / Treatments   Labs (all labs ordered are listed, but only abnormal results are displayed) Labs Reviewed  CBC - Abnormal; Notable for the following components:      Result Value   MCV 78.1 (*)    MCH 24.6 (*)    All other components within normal limits  COMPREHENSIVE METABOLIC PANEL WITH GFR  ETHANOL  RAPID URINE DRUG SCREEN, HOSP PERFORMED  HCG, SERUM, QUALITATIVE    EKG None  Radiology No results found.  Procedures Procedures    Medications Ordered in ED Medications - No data to display  ED Course/ Medical Decision Making/ A&P                                 Medical Decision Making 18 year old transgender male with suicide attempt tonight.  History of same.  Hypertensive on intake vitals otherwise  normal.  Cardiopulmonary normal exams are benign.  Psychiatric exam as above with concern for suicidality with plan without AVH or homicidality.  Does not appear to respond to internal stimuli at this time.  It is  Amount and/or Complexity of Data Reviewed Labs: ordered.    Details: BC CMP ethanol  levels normal.  Patient is not pregnant and UDS has been negative.   Patient is medically clear pending TTS evaluation at this time for suicide attempt.  Wound will not require suture repair.  Dressed with bacitracin  and Coban by ED RN Fawn Hooks.  Care of this patient signed out to oncoming ED provider  at time of shift change. All pertinent HPI, physical exam, and laboratory findings were discussed with them prior to my departure. Disposition of patient pending completion of workup, reevaluation, and clinical judgement of oncoming ED provider.  Pending psych eval at this time.  This chart was dictated using voice recognition software, Dragon. Despite the best efforts of this provider to proofread and correct errors, errors may still occur which can change documentation meaning.          Final Clinical Impression(s) / ED Diagnoses Final diagnoses:  None    Rx / DC Orders ED Discharge Orders     None         Kae Oram, PA-C 01/28/24 0606    Ballard Bongo, MD 01/28/24 [phone]

## 2024-01-28 NOTE — ED Notes (Signed)
 Pt assisted into safe transport vehicle. Correct destination and patient information confirmed with driver.

## 2024-01-28 NOTE — Consult Note (Signed)
 [facility] Psychiatric Consult Initial  Patient Name: .Antonio [name]  MRN: [id number]  DOB: January 04, 2006  Consult Order details:  Orders (From admission, onward)     Start     Ordered   01/28/24 0603  CONSULT TO CALL ACT TEAM       Ordering Provider: Kae Oram, PA-C  Provider:  (Not yet assigned)  Question:  Reason for Consult?  Answer:  SI with attempt tonight with L wrist laceration   01/28/24 0602             Mode of Visit: Tele-visit Virtual Statement:TELE PSYCHIATRY ATTESTATION & CONSENT As the provider for this telehealth consult, I attest that I verified the patient's identity using two separate identifiers, introduced myself to the patient, provided my credentials, disclosed my location, and performed this encounter via a HIPAA-compliant, real-time, face-to-face, two-way, interactive audio and video platform and with the full consent and agreement of the patient (or guardian as applicable.) Patient physical location: [facility]. Telehealth provider physical location: home office in state of [place].   Video start time: 0915 Video end time: 0931    Psychiatry Consult Evaluation  Service Date: Jan 28, 2024 LOS:  LOS: 0 days  Chief Complaint Suicidal attempt  Primary Psychiatric Diagnoses  Suicide ideation 2.  Major depressive disorder 3.  Gender dysphoria  Assessment  Antonio [name] is a 18 y.o. adult admitted: Presented to the EDfor 01/28/2024  4:16 AM for SI. He carries the psychiatric diagnoses of suicidal ideation, gender dysphoria, mood disorders, social anxiety disorder and has a past medical history of see chart.     Diagnoses:  Active Hospital problems: Active Problems:   Recurrent major depressive disorder (HCC)    Plan   ## Psychiatric Medication Recommendations:  Continue home medication  ## Medical Decision Making Capacity: Not specifically addressed in this encounter  ## Further Work-up:  --  UDS -- most recent EKG on   ## Disposition:-- We  recommend inpatient psychiatric hospitalization when medically cleared. Patient is under voluntary admission status at this time; please IVC if attempts to leave hospital.  ## Behavioral / Environmental: - No specific recommendations at this time.     ## Safety and Observation Level:  - Based on my clinical evaluation, I estimate the patient to be at low risk risk of self harm in the current setting. - At this time, we recommend  routine. This decision is based on my review of the chart including patient's history and current presentation, interview of the patient, mental status examination, and consideration of suicide risk including evaluating suicidal ideation, plan, intent, suicidal or self-harm behaviors, risk factors, and protective factors. This judgment is based on our ability to directly address suicide risk, implement suicide prevention strategies, and develop a safety plan while the patient is in the clinical setting. Please contact our team if there is a concern that risk level has changed.  CSSR Risk Category:C-SSRS RISK CATEGORY: High Risk  Suicide Risk Assessment: Patient has following modifiable risk factors for suicide: active suicidal ideation, under treated depression , and social isolation, which we are addressing by psych. Patient has following non-modifiable or demographic risk factors for suicide: history of suicide attempt and history of self harm behavior Patient has the following protective factors against suicide: Supportive family  Thank you for this consult request. Recommendations have been communicated to the primary team.  We will inpatient admission  at this time.   Dorthea Gauze, NP  History of Present Illness  Relevant Aspects of Hospital ED Course:  Admitted on 01/28/2024 for SI.  Patient Report:  Patient is alert and oriented x 4, speech is clear, maintain eye contact.  Patient appearance is well groomed for the environment, mood is anxious and  depressed affect is flat.  Patient is cooperative and answered questions when asked.  When asked if patient was currently suicidal patient stated he did not know.  Patient denies HI, AVH or paranoia.  Denies access to guns.  Denies alcohol use, denies illicit drug use or smoking.  I review of patient records show history of suicide attempts.  According to patient he lives with both his mother and his step father.  Patient denies any trauma, patient reported compliance with medication but did not remember what medicine they were taking.  Patient was last hospitalized in March 2025 for similar suicide attempts.  Given patient prior history and current presentation.  Writer discussed with patient the need for hospitalization.  And given the fact that patient was not sure if they were currently suicidal this is also deciding factor for patient in patient's psych hospitalization.  Psych ROS:  Depression: Yes Anxiety: Yes Mania (lifetime and current): Unknown Psychosis: (lifetime and current): Unknown  Collateral information:  Contacted  Review of Systems  Constitutional: Negative.   HENT: Negative.    Eyes: Negative.   Respiratory: Negative.    Cardiovascular: Negative.   Gastrointestinal: Negative.   Genitourinary: Negative.   Musculoskeletal: Negative.   Skin: Negative.   Neurological: Negative.   Psychiatric/Behavioral:  Positive for depression and suicidal ideas. The patient is nervous/anxious.      Psychiatric and Social History  Psychiatric History:  Information collected from patient and chart review  Prev Dx/Sx: See chart Current Psych Provider: According to patient they see a provider [facility] Home Meds (current): See chart Previous Med Trials: Unknown Therapy: Patient does see a therapist but did not remember the name of the therapist  Prior Psych Hospitalization: Yes Prior Self Harm: Yes Prior Violence: Unknown  Family Psych History: Unknown Family Hx  suicide: Unknown  Social History:  Developmental Hx: Normal Educational Hx: High school Occupational Hx: Unknown Legal Hx: Denies Living Situation: Lives with mother and stepfather Spiritual Hx: Unknown Access to weapons/lethal means: Denies access to weapons and guns  Substance History Alcohol: Denies Type of alcohol n/a Last Drink n/a Number of drinks per day n/a History of alcohol withdrawal seizures unknown History of DT's unknown Tobacco: Denies Illicit drugs: Denies Prescription drug abuse: Denies Rehab hx: Denies  Exam Findings  Physical Exam: See chart Vital Signs:  Temp:  [98.6 F (37 C)] 98.6 F (37 C) (05/17 0419) Pulse Rate:  [87] 87 (05/17 0419) Resp:  [16] 16 (05/17 0419) BP: (143)/(95) 143/95 (05/17 0419) SpO2:  [96 %] 96 % (05/17 0419) Weight:  [104.3 kg] 104.3 kg (05/17 0421) Blood pressure (!) 143/95, pulse 87, temperature 98.6 F (37 C), resp. rate 16, height 5\' 5"  (1.651 m), weight 104.3 kg, SpO2 96%. Body mass index is 38.27 kg/m.  Physical Exam HENT:     Head: Normocephalic.     Nose: Nose normal.  Eyes:     Pupils: Pupils are equal, round, and reactive to light.  Cardiovascular:     Rate and Rhythm: Normal rate.  Pulmonary:     Effort: Pulmonary effort is normal.  Musculoskeletal:        General: Normal range of motion.     Cervical back:  Normal range of motion.  Neurological:     General: No focal deficit present.     Mental Status: He is alert.  Psychiatric:        Mood and Affect: Mood normal.        Behavior: Behavior normal.        Thought Content: Thought content normal.        Judgment: Judgment normal.     Mental Status Exam: General Appearance: Casual  Orientation:  Full (Time, Place, and Person)  Memory:  Immediate;   Good  Concentration:  Attention Span: Good  Recall:  Good  Attention  Fair  Eye Contact:  Good  Speech:  Clear and Coherent  Language:  Good  Volume:  Normal  Mood: Depressed, flat affect  Affect:   Depressed and Flat  Thought Process:  Coherent  Thought Content:  WDL  Suicidal Thoughts:  Yes.  with intent/plan  Homicidal Thoughts:  No  Judgement:  Poor  Insight:  Fair  Psychomotor Activity:  Normal  Akathisia:  NA  Fund of Knowledge:  Fair      Assets:  Desire for Improvement Resilience  Cognition:  WNL  ADL's:  Intact  AIMS (if indicated):        Other History   These have been pulled in through the EMR, reviewed, and updated if appropriate.  Family History:  The patient's family history includes Anxiety disorder in his mother; Depression in his mother; Hypertension in his maternal grandmother.  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/2015   Asthma    Bupropion  overdose 01/16/2020   Complication of anesthesia    gets disoriented and shakes after surgery   Deliberate self-cutting 10/17/2018   Depression    Eczema    Fracture of 5th metatarsal    Gender dysphoria    Intentional overdose of drug in tablet form (HCC) 04/03/2020   MDD (major depressive disorder), recurrent severe, without psychosis (HCC) 11/02/2021   Moderate episode of recurrent major depressive disorder (HCC) 04/24/2020   Multiple allergies    Severe episode of recurrent major depressive disorder, without psychotic features (HCC) 01/02/2020   Suicidal ideations 08/31/2022   Suicide attempt by cutting of wrist (HCC) 07/28/2022    Surgical History: Past Surgical History:  Procedure Laterality Date   SUPPRELIN  IMPLANT Left 01/22/2019   Procedure: SUPPRELIN  IMPLANT;  Surgeon: Verlena Glenn, MD;  Location: [facility];  Service: Pediatrics;  Laterality: Left;   SUPPRELIN  REMOVAL N/A 03/03/2020   Procedure: SUPPRELIN  REMOVAL;  Surgeon: Verlena Glenn, MD;  Location: [facility];  Service: Pediatrics;  Laterality: N/A;   TONSILLECTOMY     TYMPANOSTOMY TUBE PLACEMENT        Medications:  No current facility-administered medications for this encounter.  Current Outpatient Medications:    albuterol  (VENTOLIN  HFA) 108 (90 Base) MCG/ACT inhaler, Inhale 1-2 puffs into the lungs every 4 (four) hours as needed for wheezing or shortness of breath., Disp: 18 g, Rfl: 1   ARIPiprazole  (ABILIFY ) 15 MG tablet, Take 1 tablet (15 mg total) by mouth at bedtime., Disp: 30 tablet, Rfl: 0   busPIRone  (BUSPAR ) 10 MG tablet, Take 1 tablet (10 mg total) by mouth 2 (two) times daily., Disp: 60 tablet, Rfl: 1   EPINEPHrine  (EPIPEN  2-PAK) 0.3 mg/0.3 mL IJ SOAJ injection, Inject 0.3 mg into the muscle as needed for anaphylaxis (AS DIRECTED)., Disp: 2  each, Rfl: 1   escitalopram  (LEXAPRO ) 10 MG tablet, Take 1 tablet (10 mg total) by mouth daily., Disp: 30 tablet, Rfl: 1   ibuprofen  (ADVIL ) 200 MG tablet, Take 400 mg by mouth every 6 (six) hours as needed for mild pain (pain score 1-3)., Disp: , Rfl:    metFORMIN  (GLUCOPHAGE ) 500 MG tablet, TAKE 1 TABLET BY MOUTH 2 TIMES DAILY WITH A MEAL., Disp: 180 tablet, Rfl: 1   naltrexone  (DEPADE) 50 MG tablet, Take 1 tablet (50 mg total) by mouth daily., Disp: 90 tablet, Rfl: 1   omeprazole  (PRILOSEC) 40 MG capsule, Take 1 capsule (40 mg total) by mouth in the morning and at bedtime. (Patient taking differently: Take 40 mg by mouth daily.), Disp: 180 capsule, Rfl: 1   PATADAY  0.1 % ophthalmic solution, Place 1 drop into both eyes 3 (three) times daily as needed for allergies., Disp: , Rfl:    propranolol  (INDERAL ) 10 MG tablet, Take 1 tablet (10 mg total) by mouth at bedtime., Disp: 30 tablet, Rfl: 0   Testosterone  Cypionate 200 MG/ML SOLN, Inject weekly under the skin as directed (Patient taking differently: Inject into the skin See admin instructions. Inject every Sunday under the skin as directed), Disp: 4 mL, Rfl: 1   traZODone  (DESYREL ) 50 MG tablet, Take 1-2 tablets (50-100 mg total) by mouth at bedtime as needed for sleep., Disp: 30 tablet,  Rfl: 0   triamcinolone  ointment (KENALOG ) 0.1 %, APPLY TOPICALLY 2 (TWO) TIMES DAILY AS NEEDED (ECZEMA)., Disp: 30 g, Rfl: 0  Allergies: Allergies  Allergen Reactions   Egg-Derived Products Anaphylaxis   Justicia Adhatoda Anaphylaxis and Other (See Comments)    All "TREE NUTS"   Peanut-Containing Drug Products Anaphylaxis    Dorthea Gauze, NP

## 2024-01-29 MED ORDER — PANTOPRAZOLE SODIUM 40 MG PO TBEC
80.0000 mg | DELAYED_RELEASE_TABLET | Freq: Every day | ORAL | Status: DC
Start: 2024-01-29 — End: 2024-02-01
  Administered 2024-01-29 – 2024-02-01 (×4): 80 mg via ORAL
  Filled 2024-01-29 (×6): qty 2

## 2024-01-29 MED ORDER — METFORMIN HCL ER 500 MG PO TB24
500.0000 mg | ORAL_TABLET | Freq: Every day | ORAL | Status: DC
  Administered 2024-01-30 – 2024-02-01 (×3): 500 mg via ORAL
  Filled 2024-01-29 (×3): qty 1

## 2024-01-29 MED ORDER — PROPRANOLOL HCL 10 MG PO TABS
10.0000 mg | ORAL_TABLET | Freq: Every day | ORAL | Status: DC
  Administered 2024-01-29 – 2024-01-31 (×3): 10 mg via ORAL
  Filled 2024-01-29 (×3): qty 1

## 2024-01-29 MED ORDER — BUSPIRONE HCL 10 MG PO TABS
10.0000 mg | ORAL_TABLET | Freq: Two times a day (BID) | ORAL | Status: DC
  Administered 2024-01-29 – 2024-02-01 (×6): 10 mg via ORAL
  Filled 2024-01-29: qty 2
  Filled 2024-01-29 (×6): qty 1

## 2024-01-29 MED ORDER — NALTREXONE HCL 50 MG PO TABS
50.0000 mg | ORAL_TABLET | Freq: Every day | ORAL | Status: DC
  Administered 2024-01-29 – 2024-02-01 (×4): 50 mg via ORAL
  Filled 2024-01-29 (×5): qty 1

## 2024-01-29 MED ORDER — ESCITALOPRAM OXALATE 20 MG PO TABS
20.0000 mg | ORAL_TABLET | Freq: Every day | ORAL | Status: DC
  Administered 2024-01-29 – 2024-02-01 (×4): 20 mg via ORAL
  Filled 2024-01-29: qty 1
  Filled 2024-01-29: qty 2
  Filled 2024-01-29 (×3): qty 1
  Filled 2024-01-29: qty 2

## 2024-01-29 NOTE — BHH Suicide Risk Assessment (Signed)
 [facility] Admission Suicide Risk Assessment   Nursing information obtained from:  Patient Demographic factors:  Male Current Mental Status:  Self-harm thoughts, Self-harm behaviors Loss Factors:  Financial problems / change in socioeconomic status Historical Factors:  Impulsivity Risk Reduction Factors:  Living with another person, especially a relative  Total Time spent with patient: 1 hour Principal Problem: <principal problem not specified> Diagnosis:  Active Problems:   Suicidal ideations   Subjective Data:   Antonio [name] is a 18 y.o. assigned male at birth transgender male with a past psychiatric history of ASD level 1, social anxiety disorder, major depressive disorder with multiple psychiatric hospitalizations in the context of suicide by cutting/OD/swallowing blade, borderline personality traits as well as self-mutilating behaviors. Patient initially arrived to [facility] on 5/17 for suicide attempt with left wrist laceration with pocketknife on the a.m. of 5/17   Continued Clinical Symptoms:  Alcohol Use Disorder Identification Test Final Score (AUDIT): 0 The "Alcohol Use Disorders Identification Test", Guidelines for Use in Primary Care, Second Edition.  World Science writer [facility]). Score between 0-7:  no or low risk or alcohol related problems. Score between 8-15:  moderate risk of alcohol related problems. Score between 16-19:  high risk of alcohol related problems. Score 20 or above:  warrants further diagnostic evaluation for alcohol dependence and treatment.   CLINICAL FACTORS:   Depression:   Anhedonia Impulsivity Severe Personality Disorders:   Cluster B More than one psychiatric diagnosis Previous Psychiatric Diagnoses and Treatments   Psychiatric Specialty Exam:   Presentation    General Appearance:  Appropriate for Environment   Eye Contact:  Good   Speech:  Clear and Coherent   Speech Volume:  Normal   Handedness:  Right     Mood and  Affect    Mood:  Euthymic   Affect:  Appropriate; Congruent     Thinking     Thought Processes:  Linear; Coherent   Descriptions of Associations:  Intact   Orientation:  Full (Time, Place and Person)   Thought Content:  Logical   History of Schizophrenia/Schizoaffective disorder:  No     Duration of Psychotic Symptoms: None   Hallucinations:  None   Ideas of Reference:  None   Suicidal Thoughts:  No -- (denies) -- (denies)   Homicidal Thoughts:  No     Sensorium      Memory:  Immediate Fair   Judgment:  Fair   Insight:  Shallow     Executive Functions      Concentration:  Fair   Attention Span:  Fair   Recall:  Fair   Fund of Knowledge:  Fair   Language:  Fair     Musculoskeletal:   Strength & muscle tone: within normal limits Gait & station: normal Patient leans: N/A   Psychomotor Activity:  Normal       Assets:  Manufacturing systems engineer; Financial Resources/Insurance; Housing; Resilience; Social Support       Sleep:  Good       Physical Exam Constitutional:      General: He is not in acute distress.    Appearance: He is not ill-appearing.  Pulmonary:     Effort: Pulmonary effort is normal. No respiratory distress.  Musculoskeletal:        General: Signs of injury present.     Comments: Superficial laceration of left wrist, covered with bandage.   Neurological:     Mental Status: He is alert.      Review of  Systems  Constitutional:  Negative for chills and fever.  Gastrointestinal:  Negative for nausea and vomiting.  All other systems reviewed and are negative.   Blood pressure 126/83, pulse 81, temperature 98.5 F (36.9 C), temperature source Oral, resp. rate 20, height 5\' 5"  (1.651 m), weight 99.8 kg, SpO2 98%. Body mass index is 36.61 kg/m.   COGNITIVE FEATURES THAT CONTRIBUTE TO RISK:  Closed-mindedness, Loss of executive function, Polarized thinking, and Thought constriction (tunnel vision)     SUICIDE RISK:  Severe: While patient denies suicidal intent, he has a extensive history of suicide attempts including overdose on medication and deep cutting requiring stitches.  As patient has little insight into why he feels the need to hurt himself, alongside impaired self-control, we believe that he presents a significant risk of self-harm at this time.  The patient describes no subjective intent, but some objective markers of intent (i.e., choice of lethal method).  The method is accessible, some limited preparatory behavior, evidence of impaired self-control, severe dysphoria/symptomatology, multiple risk factors present.  PLAN OF CARE: See H&P for assessment and plan.   I certify that inpatient services furnished can reasonably be expected to improve the patient's condition.   Mishawn Hemann, MD 01/29/2024, 12:55 PM

## 2024-01-29 NOTE — Group Note (Signed)
 Date:  01/29/2024 Time:  9:19 AM  Group Topic/Focus:  Goals Group:   The focus of this group is to help patients establish daily goals to achieve during treatment and discuss how the patient can incorporate goal setting into their daily lives to aide in recovery. Orientation:   The focus of this group is to educate the patient on the purpose and policies of crisis stabilization and provide a format to answer questions about their admission.  The group details unit policies and expectations of patients while admitted.    Participation Level:  Active  Participation Quality:  Appropriate  Affect:  Appropriate  Cognitive:  Appropriate  Insight: Appropriate  Engagement in Group:  Engaged  Modes of Intervention:  Problem-solving  Additional Comments:  Goal is to speak with Mom  Violette Grief 01/29/2024, 9:19 AM
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
## 2024-01-29 NOTE — Plan of Care (Signed)
   Problem: Education: Goal: Emotional status will improve Outcome: Progressing   Problem: Activity: Goal: Interest or engagement in activities will improve Outcome: Progressing

## 2024-01-29 NOTE — Progress Notes (Signed)
   01/29/24 0845  Psych Admission Type (Psych Patients Only)  Admission Status Voluntary  Psychosocial Assessment  Patient Complaints None  Eye Contact Brief  Facial Expression Flat  Affect Flat  Speech Logical/coherent  Interaction Assertive  Motor Activity Slow  Appearance/Hygiene Unremarkable  Behavior Characteristics Cooperative  Mood Depressed  Thought Process  Coherency WDL  Content WDL  Delusions None reported or observed  Perception WDL  Hallucination None reported or observed  Judgment Poor  Confusion None  Danger to Self  Current suicidal ideation? Denies  Agreement Not to Harm Self Yes  Description of Agreement verbal  Danger to Others  Danger to Others None reported or observed  Danger to Others Abnormal  Harmful Behavior to others No threats or harm toward other people  Destructive Behavior No threats or harm toward property

## 2024-01-29 NOTE — Group Note (Signed)
 Date:  01/30/2024 Time:  1:08 AM  Group Topic/Focus:  Wrap-Up Group:   The focus of this group is to help patients review their daily goal of treatment and discuss progress on daily workbooks.    Participation Level:  Active  Participation Quality:  Appropriate  Affect:  Appropriate  Cognitive:  Appropriate  Insight: Appropriate  Engagement in Group:  Engaged  Modes of Intervention:  Discussion  Additional Comments:  Patient stated that he had a great day.  Patient stated that his goal is that he wants to work on being honest with his mother   Antonio [name] 01/30/2024, 1:08 AM
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
## 2024-01-29 NOTE — Progress Notes (Signed)
   01/28/24 2130  Psych Admission Type (Psych Patients Only)  Admission Status Voluntary  Psychosocial Assessment  Patient Complaints None  Eye Contact Brief  Facial Expression Flat  Affect Flat  Speech Logical/coherent  Interaction Assertive  Motor Activity Slow  Appearance/Hygiene Unremarkable  Behavior Characteristics Cooperative  Mood Depressed  Thought Process  Coherency WDL  Content WDL  Delusions None reported or observed  Perception WDL  Hallucination None reported or observed  Judgment Poor  Confusion None  Danger to Self  Current suicidal ideation? Denies  Agreement Not to Harm Self Yes  Description of Agreement Verbal Contract  Danger to Others  Danger to Others Reported or observed

## 2024-01-29 NOTE — Plan of Care (Signed)
  Problem: Education: Goal: Knowledge of [facility] Education information/materials will improve Outcome: Progressing   Problem: Activity: Goal: Interest or engagement in activities will improve Outcome: Progressing Goal: Sleeping patterns will improve Outcome: Progressing   Problem: Health Behavior/Discharge Planning: Goal: Compliance with treatment plan for underlying cause of condition will improve Outcome: Progressing   Problem: Physical Regulation: Goal: Ability to maintain clinical measurements within normal limits will improve Outcome: Progressing

## 2024-01-30 ENCOUNTER — Encounter (HOSPITAL_COMMUNITY): Payer: Self-pay

## 2024-01-30 LAB — LIPID PANEL
Cholesterol: 212 mg/dL — ABNORMAL HIGH (ref 0–200)
HDL: 38 mg/dL — ABNORMAL LOW (ref >40)
LDL Cholesterol: 153 mg/dL — ABNORMAL HIGH (ref 0–99)
Total CHOL/HDL Ratio: 5.6 ratio
Triglycerides: 105 mg/dL (ref <150)
VLDL: 21 mg/dL (ref 0–40)

## 2024-01-30 LAB — HEMOGLOBIN A1C
Hgb A1c MFr Bld: 5.1 % (ref 4.8–5.6)
Mean Plasma Glucose: 99.67 mg/dL

## 2024-01-30 LAB — TSH: TSH: 1.996 u[IU]/mL (ref 0.350–4.500)

## 2024-01-30 MED ORDER — TRIAMCINOLONE ACETONIDE 0.1 % EX CREA
TOPICAL_CREAM | Freq: Two times a day (BID) | CUTANEOUS | Status: DC | PRN
  Administered 2024-01-30: 1 via TOPICAL
  Filled 2024-01-30 (×2): qty 15

## 2024-01-30 NOTE — Group Note (Signed)
 Recreation Therapy Group Note   Group Topic:Stress Management  Group Date: 01/30/2024 Start Time: 0935 End Time: 1000 Facilitators: Cannie Muckle-McCall, LRT,CTRS Location: [address] Dayroom   Group Topic: Stress Management   Goal Area(s) Addresses:  Patient will actively participate in stress management techniques presented during session.  Patient will successfully identify benefit of practicing stress management post d/c.   Behavioral Response: N/A  Intervention: Relaxation exercise with ambient sound and script   Activity: Guided Imagery. LRT provided education, instruction, and demonstration on practice of visualization via guided imagery. Patient was asked to participate in the technique introduced during session. LRT debriefed including topics of mindfulness, stress management and specific scenarios each patient could use these techniques. Patients were given suggestions of ways to access scripts post d/c and encouraged to explore Youtube and other apps available on smartphones, tablets, and computers.  Education:  Stress Management, Discharge Planning.   Education Outcome: Acknowledges education  Clinical Observations/Feedback: Patient actively engaged in technique introduced, expressed no concerns and demonstrated ability to practice skill independently post d/c.    Affect/Mood: N/A   Participation Level: Did not attend    Clinical Observations/Individualized Feedback:     Plan: Continue to engage patient in RT group sessions 2-3x/week.   Wilhelmine Krogstad-McCall, LRT,CTRS 01/30/2024 11:33 AM

## 2024-01-30 NOTE — Group Note (Signed)
 Therapy Group Note  Group Topic:Other  Group Date: 01/30/2024 Start Time: 1430 End Time: 1509 Facilitators: Lynnda Sas, OT    The primary objective of this topic is to explore and understand the concept of occupational balance in the context of daily living. The term "occupational balance" is defined broadly, encompassing all activities that occupy an individual's time and energy, including self-care, leisure, and work-related tasks. The goal is to guide participants towards achieving a harmonious blend of these activities, tailored to their personal values and life circumstances. This balance is aimed at enhancing overall well-being, not by equally distributing time across activities, but by ensuring that daily engagements are fulfilling and not draining. The content delves into identifying various barriers that individuals face in achieving occupational balance, such as overcommitment, misaligned priorities, external pressures, and lack of effective time management. The impact of these barriers on occupational performance, roles, and lifestyles is examined, highlighting issues like reduced efficiency, strained relationships, and potential health problems. Strategies for cultivating occupational balance are a key focus. These strategies include practical methods like time blocking, prioritizing tasks, establishing self-care rituals, decluttering, connecting with nature, and engaging in reflective practices. These approaches are designed to be adaptable and applicable to a wide range of life scenarios, promoting a proactive and mindful approach to daily living. The overall aim is to equip participants with the knowledge and tools to create a balanced lifestyle that supports their mental, emotional, and physical health, thereby improving their functional performance in daily life.     Participation Level: Engaged   Participation Quality: Independent   Behavior: Appropriate   Speech/Thought  Process: Relevant   Affect/Mood: Appropriate   Insight: Fair   Judgement: Fair      Modes of Intervention: Education  Patient Response to Interventions:  Attentive   Plan: Continue to engage patient in OT groups 2 - 3x/week.  01/30/2024  Lynnda Sas, OT  Bradden Tadros, OT

## 2024-01-30 NOTE — Progress Notes (Signed)
 D: Patient is alert, oriented, pleasant, and cooperative. Denies SI, HI, AVH, and verbally contracts for safety. Patient reports he slept good last night without sleeping medication. Patient reports his appetite as fair, energy level as low, and concentration as good. Patient rates his depression 0/10, hopelessness 0/10, and anxiety 0/10. Patient reports eczema.     A: Scheduled medications administered per MD order. PRN kenalog  cream administered. Support provided. Patient educated on safety on the unit and medications. Routine safety checks every 15 minutes. Patient stated understanding to tell nurse about any new physical symptoms. Patient understands to tell staff of any needs.     R: No adverse drug reactions noted. Patient remains safe at this time and will continue to monitor.    01/30/24 1300  Psych Admission Type (Psych Patients Only)  Admission Status Voluntary  Psychosocial Assessment  Patient Complaints None  Eye Contact Fair  Facial Expression Flat  Affect Flat  Speech Logical/coherent  Interaction Assertive  Motor Activity Slow  Appearance/Hygiene Unremarkable  Behavior Characteristics Cooperative  Mood Depressed  Thought Process  Coherency WDL  Content WDL  Delusions None reported or observed  Perception WDL  Hallucination None reported or observed  Judgment Poor  Confusion None  Danger to Self  Current suicidal ideation? Denies  Agreement Not to Harm Self Yes  Description of Agreement verbal  Danger to Others  Danger to Others None reported or observed  Danger to Others Abnormal  Harmful Behavior to others No threats or harm toward other people  Destructive Behavior No threats or harm toward property

## 2024-01-30 NOTE — [facility] Group Notes (Signed)
 Adult Psychoeducational Group Note  Date:  01/30/2024 Time:  10:21 AM  Group Topic/Focus:  Goals Group:   The focus of this group is to help patients establish daily goals to achieve during treatment and discuss how the patient can incorporate goal setting into their daily lives to aide in recovery.  Participation Level:  Active  Participation Quality:  Appropriate and Attentive  Affect:  Appropriate  Cognitive:  Appropriate  Insight: Appropriate  Engagement in Group:  Engaged  Modes of Intervention:  Exploration  Additional Comments:  Pt attended goals group. Pt stated his goal is to communicate truthfully with his mom. Pt identified no SI/HI   Alean Amen 01/30/2024, 10:21 AM

## 2024-01-30 NOTE — BH IP Treatment Plan (Signed)
 Interdisciplinary Treatment and Diagnostic Plan Update  01/30/2024 Time of Session: 1007 Antonio [name] MRN: [id number]  Principal Diagnosis: Suicidal ideations  Secondary Diagnoses: Principal Problem:   Suicidal ideations   Current Medications:  Current Facility-Administered Medications  Medication Dose Route Frequency Provider Last Rate Last Admin   acetaminophen  (TYLENOL ) tablet 650 mg  650 mg Oral Q6H PRN Dorthea Gauze, NP       alum & mag hydroxide-simeth (MAALOX/MYLANTA) 200-200-20 MG/5ML suspension 30 mL  30 mL Oral Q4H PRN Dorthea Gauze, NP       ARIPiprazole  (ABILIFY ) tablet 15 mg  15 mg Oral QHS Ajibola, Ene A, NP   15 mg at 01/29/24 2106   busPIRone  (BUSPAR ) tablet 10 mg  10 mg Oral BID Gwyndolyn Lerner, MD   10 mg at 01/30/24 1635   escitalopram  (LEXAPRO ) tablet 20 mg  20 mg Oral Daily Gwyndolyn Lerner, MD   20 mg at 01/30/24 0741   magnesium  hydroxide (MILK OF MAGNESIA) suspension 30 mL  30 mL Oral Daily PRN Dorthea Gauze, NP       metFORMIN  (GLUCOPHAGE -XR) 24 hr tablet 500 mg  500 mg Oral Q breakfast Gwyndolyn Lerner, MD   500 mg at 01/30/24 0740   naltrexone  (DEPADE) tablet 50 mg  50 mg Oral Daily Gwyndolyn Lerner, MD   50 mg at 01/30/24 6578   OLANZapine  (ZYPREXA ) injection 10 mg  10 mg Intramuscular TID PRN Dorthea Gauze, NP       OLANZapine  (ZYPREXA ) injection 5 mg  5 mg Intramuscular TID PRN Dorthea Gauze, NP       OLANZapine  zydis (ZYPREXA ) disintegrating tablet 5 mg  5 mg Oral TID PRN Dorthea Gauze, NP       pantoprazole  (PROTONIX ) EC tablet 80 mg  80 mg Oral Daily Gwyndolyn Lerner, MD   80 mg at 01/30/24 0740   propranolol  (INDERAL ) tablet 10 mg  10 mg Oral QHS Crawford, Benjamin, MD   10 mg at 01/29/24 2106   traZODone  (DESYREL ) tablet 50-100 mg  50-100 mg Oral QHS PRN Ajibola, Ene A, NP   50 mg at 01/29/24 2106   triamcinolone  cream (KENALOG ) 0.1 % cream   Topical BID PRN Gwyndolyn Lerner, MD   Given at 01/30/24 1635   PTA Medications: Medications  Prior to Admission  Medication Sig Dispense Refill Last Dose/Taking   albuterol  (VENTOLIN  HFA) 108 (90 Base) MCG/ACT inhaler Inhale 1-2 puffs into the lungs every 4 (four) hours as needed for wheezing or shortness of breath. 18 g 1    ARIPiprazole  (ABILIFY ) 15 MG tablet Take 1 tablet (15 mg total) by mouth at bedtime. 30 tablet 0    busPIRone  (BUSPAR ) 10 MG tablet Take 1 tablet (10 mg total) by mouth 2 (two) times daily. 60 tablet 1    EPINEPHrine  (EPIPEN  2-PAK) 0.3 mg/0.3 mL IJ SOAJ injection Inject 0.3 mg into the muscle as needed for anaphylaxis (AS DIRECTED). 2 each 1    escitalopram  (LEXAPRO ) 10 MG tablet Take 1 tablet (10 mg total) by mouth daily. 30 tablet 1    ibuprofen  (ADVIL ) 200 MG tablet Take 400 mg by mouth every 6 (six) hours as needed for mild pain (pain score 1-3).      metFORMIN  (GLUCOPHAGE ) 500 MG tablet TAKE 1 TABLET BY MOUTH 2 TIMES DAILY WITH A MEAL. 180 tablet 1    naltrexone  (DEPADE) 50 MG tablet Take 1 tablet (50 mg total) by mouth daily. 90 tablet 1    omeprazole  (PRILOSEC) 40 MG capsule Take  1 capsule (40 mg total) by mouth in the morning and at bedtime. (Patient taking differently: Take 40 mg by mouth daily.) 180 capsule 1    PATADAY  0.1 % ophthalmic solution Place 1 drop into both eyes 3 (three) times daily as needed for allergies.      propranolol  (INDERAL ) 10 MG tablet Take 1 tablet (10 mg total) by mouth at bedtime. 30 tablet 0    Testosterone  Cypionate 200 MG/ML SOLN Inject weekly under the skin as directed (Patient taking differently: Inject into the skin See admin instructions. Inject every Sunday under the skin as directed) 4 mL 1    traZODone  (DESYREL ) 50 MG tablet Take 1-2 tablets (50-100 mg total) by mouth at bedtime as needed for sleep. 30 tablet 0    triamcinolone  ointment (KENALOG ) 0.1 % APPLY TOPICALLY 2 (TWO) TIMES DAILY AS NEEDED (ECZEMA). 30 g 0     Patient Stressors: Financial difficulties   Health problems   Marital or family conflict    Patient  Strengths: Ability for insight  Communication skills  Supportive family/friends   Treatment Modalities: Medication Management, Group therapy, Case management,  1 to 1 session with clinician, Psychoeducation, Recreational therapy.   Physician Treatment Plan for Primary Diagnosis: Suicidal ideations Long Term Goal(s):     Short Term Goals: Ability to identify changes in lifestyle to reduce recurrence of condition will improve Ability to verbalize feelings will improve Ability to disclose and discuss suicidal ideas Ability to demonstrate self-control will improve Ability to identify and develop effective coping behaviors will improve Ability to maintain clinical measurements within normal limits will improve Compliance with prescribed medications will improve Ability to identify triggers associated with substance abuse/mental health issues will improve  Medication Management: Evaluate patient's response, side effects, and tolerance of medication regimen.  Therapeutic Interventions: 1 to 1 sessions, Unit Group sessions and Medication administration.  Evaluation of Outcomes: Not Progressing  Physician Treatment Plan for Secondary Diagnosis: Principal Problem:   Suicidal ideations  Long Term Goal(s):     Short Term Goals: Ability to identify changes in lifestyle to reduce recurrence of condition will improve Ability to verbalize feelings will improve Ability to disclose and discuss suicidal ideas Ability to demonstrate self-control will improve Ability to identify and develop effective coping behaviors will improve Ability to maintain clinical measurements within normal limits will improve Compliance with prescribed medications will improve Ability to identify triggers associated with substance abuse/mental health issues will improve     Medication Management: Evaluate patient's response, side effects, and tolerance of medication regimen.  Therapeutic Interventions: 1 to 1  sessions, Unit Group sessions and Medication administration.  Evaluation of Outcomes: Not Progressing   RN Treatment Plan for Primary Diagnosis: Suicidal ideations Long Term Goal(s): Knowledge of disease and therapeutic regimen to maintain health will improve  Short Term Goals: Ability to remain free from injury will improve, Ability to verbalize frustration and anger appropriately will improve, Ability to demonstrate self-control, Ability to participate in decision making will improve, Ability to verbalize feelings will improve, Ability to disclose and discuss suicidal ideas, Ability to identify and develop effective coping behaviors will improve, and Compliance with prescribed medications will improve  Medication Management: RN will administer medications as ordered by provider, will assess and evaluate patient's response and provide education to patient for prescribed medication. RN will report any adverse and/or side effects to prescribing provider.  Therapeutic Interventions: 1 on 1 counseling sessions, Psychoeducation, Medication administration, Evaluate responses to treatment, Monitor vital signs and CBGs as  ordered, Perform/monitor CIWA, COWS, AIMS and Fall Risk screenings as ordered, Perform wound care treatments as ordered.  Evaluation of Outcomes: Not Progressing   LCSW Treatment Plan for Primary Diagnosis: Suicidal ideations Long Term Goal(s): Safe transition to appropriate next level of care at discharge, Engage patient in therapeutic group addressing interpersonal concerns.  Short Term Goals: Engage patient in aftercare planning with referrals and resources, Increase ability to appropriately verbalize feelings, Increase emotional regulation, Facilitate acceptance of mental health diagnosis and concerns, and Increase skills for wellness and recovery  Therapeutic Interventions: Assess for all discharge needs, 1 to 1 time with Social worker, Explore available resources and support  systems, Assess for adequacy in community support network, Educate family and significant other(s) on suicide prevention, Complete Psychosocial Assessment, Interpersonal group therapy.  Evaluation of Outcomes: Not Progressing   Progress in Treatment: Attending groups: Yes. Participating in groups: Yes. Taking medication as prescribed: Yes. Toleration medication: Yes. Family/Significant other contact made: No, will contact:  Colleen Dawn (mother) [phone] Patient understands diagnosis: Yes. Discussing patient identified problems/goals with staff: Yes. Medical problems stabilized or resolved: Yes. Denies suicidal/homicidal ideation: Yes. Issues/concerns per patient self-inventory: No. Other: n/a  New problem(s) identified: No, Describe:  none   New Short Term/Long Term Goal(s): medication stabilization, elimination of SI thoughts, development of comprehensive mental wellness plan.   Patient Goals:  "I don't know" - pt later states that they can work on improving communication  Discharge Plan or Barriers: Patient recently admitted. CSW will continue to follow and assess for appropriate referrals and possible discharge planning.    Reason for Continuation of Hospitalization: Anxiety Depression Medication stabilization Other; describe mood stabilization, discharge planning  Estimated Length of Stay: 3-5 DAYS  Last 3 [place] Suicide Severity Risk Score: Flowsheet Row [facility] (Current) [facility] Most recent reading at 01/28/2024  1:00 PM ED from 01/28/2024 in [facility] at [facility] Most recent reading at 01/28/2024  4:22 AM UC from 11/24/2023 in [facility] [place] Most recent reading at 11/24/2023  5:44 PM  C-SSRS RISK CATEGORY High Risk High Risk Error: Question 6 not populated       Last PHQ 2/9 Scores:    10/12/2023    8:35 AM 08/31/2023    8:44 AM 05/25/2023    9:27  AM  Depression screen PHQ 2/9  Decreased Interest 0 1 1  Down, Depressed, Hopeless 0 1 0  PHQ - 2 Score 0 2 1  Altered sleeping 3 3 3   Tired, decreased energy 3 3 3   Change in appetite 1 2 1   Feeling bad or failure about yourself  1 1 1   Trouble concentrating 0 1 1  Moving slowly or fidgety/restless 0 0 0  Suicidal thoughts 0 1 1  PHQ-9 Score 8 13 11   Difficult doing work/chores   Somewhat difficult    Scribe for Treatment Team: Vernestine Gondola, LCSW 01/30/2024 4:48 PM

## 2024-01-30 NOTE — Plan of Care (Signed)

## 2024-01-30 NOTE — Progress Notes (Signed)
   01/29/24 2155  Psych Admission Type (Psych Patients Only)  Admission Status Voluntary  Psychosocial Assessment  Patient Complaints None  Eye Contact Fair  Facial Expression Flat  Affect Flat  Speech Logical/coherent  Interaction Assertive  Motor Activity Slow  Appearance/Hygiene Unremarkable  Behavior Characteristics Cooperative  Mood Depressed  Thought Process  Coherency WDL  Content WDL  Delusions None reported or observed  Perception WDL  Hallucination None reported or observed  Judgment Poor  Confusion None  Danger to Self  Current suicidal ideation? Denies  Agreement Not to Harm Self Yes  Description of Agreement verbal

## 2024-01-30 NOTE — Progress Notes (Addendum)
 [facility] MD Progress Note  01/30/2024 9:22 AM Antonio [name]  MRN:  [id number]  Principal Problem: Suicidal ideations Diagnosis: Principal Problem:   Suicidal ideations   Reason for Admission:  Antonio [name] is a 18 y.o. adult  with a past psychiatric history of ASD level 1, social anxiety disorder, major depressive disorder with multiple psychiatric hospitalizations in the context of suicide by cutting/OD/swallowing blade, borderline personality traits as well as self-mutilating behaviors. Patient initially arrived to [facility] on 5/17 for suicide attempt with left wrist laceration with pocketknife on the a.m. of 5/17.  He was then evaluated by telepsychiatry team in the emergency department, who recommended inpatient hospitalization. Admitted to [facility]untary on 5/17 for acute safety concerns, impaired functioning, and severe substance-induced psychosis or mood disturbances.  (admitted on 01/28/2024, total  LOS: 2 days )   Yesterday, the psychiatry team made following recommendations:   - Continue home Abilify  15 mg daily - Increase home Lexapro  10 mg daily ? 20 mg daily for depressed mood. - Continue home buspirone  10 mg twice daily - Continue home propranolol  10 mg nightly - Continue home metformin  500 mg every morning with breakfast - Continue home naltrexone  50 mg daily - Repeat EKG - Labs: A.m. A1c, lipid panel, TSH  Pertinent information discussed during bed progression: Denies SI HI AVH. Slept 8 hours.   PRNs required overnight: Trazodone  50 mg x1.    Information Obtained Today During Patient Interview:   On interview, patient said that he is "good."  Mood unchanged yesterday.  Slept "as well as he could" as he does not yet have a prescription for a CPAP machine, but underwent sleep testing and the findings appear to show that he should have one.  Eating well.  Denied suicidal, homicidal ideation.  Denied auditory and visual hallucinations.  Denied medication side effects.   Spontaneously brought up discharge this coming Thursday as he does not want to miss therapy appointment.  Also asked for triamcinolone  cream for eczema rash.   Past Psychiatric History: Current psychiatrist: Dr. Gaylia Kayser previously, currently in between Current therapist: Weekly DBT therapy with Ms. Earla Glassman Previous psychiatric diagnoses: Major depressive disorder, recurrent, severe without psychosis; multiple suicide attempts including swallowing a razor blade and deep cutting, nonsuicidal self-injury, autism spectrum disorder (level 1), social anxiety disorder, generalized anxiety disorder, gender dysphoria Current psychiatric medications: Lexapro  10 mg, Abilify  15 mg with metformin  500 mg daily with breakfast, naltrexone  50 mg daily, BuSpar  10 mg twice daily, propranolol  10 mg at bedtime, Atarax  and Seroquel  as needed, trazodone  as needed, undergoing endocrinology treatment for gender dysphoria, Psychiatric medication history/compliance: Mom handles medication, consistent with meds.  Previously tried Effexor , recently changed to Lexapro .  Also trialed Pristiq , Prozac , Zoloft , Wellbutrin , guanfacine , Latuda  Psychiatric hospitalization(s): 6 hospitalizations for suicide attempt beginning in 2022.  Flaccid noted to [facility] 11/2023.  Had a 1 month residential stay at [facility] in 2024. Psychotherapy history: None Neuromodulation history: Scheduled for TMS consult History of suicide (obtained from HPI): Multiple serious suicide attempts including overdoses, deep cutting, swelling razor History of homicide or aggression (obtained in HPI): Denies   Substance Abuse History: Alcohol: Denies Tobacco: Former, nicotine  Cannabis: Former IV drug use: Denies Prescription drug use: Denies Other illicit drugs: Denies Rehab history: Denies   Past Medical History: Medical diagnoses: Anaphylaxis to tree nuts, egg derived products, peanuts.  Asthma.  Eczema. Medications: Metformin ,  EpiPen , omeprazole  40 mg, testosterone  200 mg weekly Allergies: Tree nuts, a drive products, peanuts Hospitalizations:  None Surgeries: Bilateral top of gender affirming surgery 2024 foot surgery after fracturing to, tonsillectomy Trauma: Denies Seizures: Denied as   Social History: Living situation: Home with mother and stepfather Education: Rising twelfth-grader Occupational history: None Marital status: None Children: None Legal: None Military: None   Access to firearms: Denies   Family Psychiatric History: Psychiatric diagnoses: Depression and anxiety in mother Suicide history: None known Substance use history: None known   Family Medical History:   Hypertension in maternal grandmother Past Medical History:  Diagnosis Date   Allergic rhinoconjunctivitis 06/06/2015   Allergy  with anaphylaxis due to food 06/06/2015   Asthma    Bupropion  overdose 01/16/2020   Complication of anesthesia    gets disoriented and shakes after surgery   Deliberate self-cutting 10/17/2018   Depression    Eczema    Fracture of 5th metatarsal    Gender dysphoria    Intentional overdose of drug in tablet form (HCC) 04/03/2020   MDD (major depressive disorder), recurrent severe, without psychosis (HCC) 11/02/2021   Moderate episode of recurrent major depressive disorder (HCC) 04/24/2020   Multiple allergies    Severe episode of recurrent major depressive disorder, without psychotic features (HCC) 01/02/2020   Suicidal ideations 08/31/2022   Suicide attempt by cutting of wrist (HCC) 07/28/2022   Family History:  Family History  Problem Relation Age of Onset   Depression Mother    Anxiety disorder Mother    Hypertension Maternal Grandmother    Allergic rhinitis Neg Hx    Angioedema Neg Hx    Atopy Neg Hx    Eczema Neg Hx    Immunodeficiency Neg Hx    Urticaria Neg Hx     Current Medications: Current Facility-Administered Medications  Medication Dose Route Frequency Provider Last  Rate Last Admin   acetaminophen  (TYLENOL ) tablet 650 mg  650 mg Oral Q6H PRN Dorthea Gauze, NP       alum & mag hydroxide-simeth (MAALOX/MYLANTA) 200-200-20 MG/5ML suspension 30 mL  30 mL Oral Q4H PRN Dorthea Gauze, NP       ARIPiprazole  (ABILIFY ) tablet 15 mg  15 mg Oral QHS Ajibola, Ene A, NP   15 mg at 01/29/24 2106   busPIRone  (BUSPAR ) tablet 10 mg  10 mg Oral BID Gwyndolyn Lerner, MD   10 mg at 01/30/24 1610   escitalopram  (LEXAPRO ) tablet 20 mg  20 mg Oral Daily Gwyndolyn Lerner, MD   20 mg at 01/30/24 0741   magnesium  hydroxide (MILK OF MAGNESIA) suspension 30 mL  30 mL Oral Daily PRN Dorthea Gauze, NP       metFORMIN  (GLUCOPHAGE -XR) 24 hr tablet 500 mg  500 mg Oral Q breakfast Gwyndolyn Lerner, MD   500 mg at 01/30/24 0740   naltrexone  (DEPADE) tablet 50 mg  50 mg Oral Daily Gwyndolyn Lerner, MD   50 mg at 01/30/24 9604   OLANZapine  (ZYPREXA ) injection 10 mg  10 mg Intramuscular TID PRN Dorthea Gauze, NP       OLANZapine  (ZYPREXA ) injection 5 mg  5 mg Intramuscular TID PRN Dorthea Gauze, NP       OLANZapine  zydis (ZYPREXA ) disintegrating tablet 5 mg  5 mg Oral TID PRN Dorthea Gauze, NP       pantoprazole  (PROTONIX ) EC tablet 80 mg  80 mg Oral Daily Gwyndolyn Lerner, MD   80 mg at 01/30/24 0740   propranolol  (INDERAL ) tablet 10 mg  10 mg Oral QHS Jermarcus Mcfadyen, MD   10 mg at 01/29/24 2106   traZODone  (DESYREL ) tablet  50-100 mg  50-100 mg Oral QHS PRN Ajibola, Ene A, NP   50 mg at 01/29/24 2106   triamcinolone  cream (KENALOG ) 0.1 % cream   Topical BID PRN Gwyndolyn Lerner, MD        Lab Results:  Results for orders placed or performed during the hospital encounter of 01/28/24 (from the past 48 hours)  TSH     Status: None   Collection Time: 01/30/24  6:19 AM  Result Value Ref Range   TSH 1.996 0.350 - 4.500 uIU/mL    Comment: Performed by a 3rd Generation assay with a functional sensitivity of <=0.01 uIU/mL. Performed at [facility], [address], [place], [place] [address]   Lipid panel     Status: Abnormal   Collection Time: 01/30/24  6:19 AM  Result Value Ref Range   Cholesterol 212 (H) 0 - 200 mg/dL   Triglycerides 604 <540 mg/dL   HDL 38 (L) >98 mg/dL   Total CHOL/HDL Ratio 5.6 RATIO   VLDL 21 0 - 40 mg/dL   LDL Cholesterol 119 (H) 0 - 99 mg/dL    Comment:        Total Cholesterol/HDL:CHD Risk Coronary Heart Disease Risk Table                     Men   Women  1/2 Average Risk   3.4   3.3  Average Risk       5.0   4.4  2 X Average Risk   9.6   7.1  3 X Average Risk  23.4   11.0        Use the calculated Patient Ratio above and the CHD Risk Table to determine the patient's CHD Risk.        ATP III CLASSIFICATION (LDL):  <100     mg/dL   Optimal  147-829  mg/dL   Near or Above                    Optimal  130-159  mg/dL   Borderline  562-130  mg/dL   High  >865     mg/dL   Very High Performed at [facility], [address], [place], [place] [address]     Blood Alcohol level:  Lab Results  Component Value Date   [facility] <15 01/28/2024   ETH <10 11/14/2023    Metabolic Labs: Lab Results  Component Value Date   HGBA1C 5.1 12/30/2022   MPG 99.67 12/30/2022   MPG 105 09/01/2022   Lab Results  Component Value Date   PROLACTIN 10.0 09/01/2022   PROLACTIN 10.3 04/16/2022   Lab Results  Component Value Date   CHOL 212 (H) 01/30/2024   TRIG 105 01/30/2024   HDL 38 (L) 01/30/2024   CHOLHDL 5.6 01/30/2024   VLDL 21 01/30/2024   LDLCALC 153 (H) 01/30/2024   LDLCALC 137 (H) 12/30/2022    Physical Findings: AIMS: No  CIWA:    COWS:     Psychiatric Specialty Exam:  Presentation  General Appearance: Appropriate for Environment; Casual  Eye Contact:Good  Speech:Clear and Coherent  Speech Volume:Normal  Handedness:Right   Mood and Affect  Mood:Euthymic  Affect:Appropriate; Congruent   Thought Process  Thought Processes:Linear  Descriptions of  Associations:Intact  Orientation:Full (Time, Place and Person)  Thought Content:Logical  History of Schizophrenia/Schizoaffective disorder:No  Duration of Psychotic Symptoms:N/A (none noted)  Hallucinations:Hallucinations: None  Ideas of Reference:None  Suicidal Thoughts:Suicidal  Thoughts: No  Homicidal Thoughts:Homicidal Thoughts: No   Sensorium  Memory:Immediate Fair  Judgment:Fair  Insight:Present   Executive Functions  Concentration:Fair  Attention Span:Fair  Recall:Fair  Fund of Knowledge:Fair  Language:Fair   Psychomotor Activity  Psychomotor Activity:Psychomotor Activity: Normal   Assets  Assets:Communication Skills; Financial Resources/Insurance; Housing; Resilience; Social Support   Sleep  Sleep:Sleep: Good Number of Hours of Sleep: 8    Physical Exam: Physical Exam Vitals reviewed.  Constitutional:      Appearance: He is obese.  Pulmonary:     Effort: Pulmonary effort is normal. No respiratory distress.  Musculoskeletal:        General: Normal range of motion.  Neurological:     Mental Status: He is alert.  Psychiatric:        Mood and Affect: Mood normal.    Review of Systems  Constitutional:  Negative for chills and fever.  Gastrointestinal:  Negative for constipation, diarrhea, nausea and vomiting.   Blood pressure 126/86, pulse 86, temperature 98.3 F (36.8 C), temperature source Oral, resp. rate 16, height 5\' 5"  (1.651 m), weight 99.8 kg, SpO2 98%. Body mass index is 36.61 kg/m.  Treatment Plan Summary: Daily contact with patient to assess and evaluate symptoms and progress in treatment and Medication management    ASSESSMENT:   5/18: Antonio [name] is a 18 year old assigned male at birth transgender male who presents with repeated cutting of left forearm with pocketknife.  He has a diagnosis of autism spectrum disorder (level 1) requiring assistance, major depressive disorder, severe, recurring, without psychotic features, in  current episode, as well as nonsuicidal self-injury and borderline personality traits.  He was most recently seen for a similar presentation in March 2025, and has been hospitalized for similar reasons (suicidal gestures/cutting) approximately 6 times over the last 3 years.  Per patient, the episodes of self-harm occur "like clockwork" and that "it was about that time."  On discussion with mom, it appears that he also did not have suicidal ideation around the time of the cutting, but that it was mainly a result of an uncontrollable impulses: "If I really wanted to die, I would.". Patient has very little insight as to what feelings/triggers led up to this behavior. We will restart his home medications and increase Lexapro  from 10 mg to 20 mg to target depressive symptoms.  Will likely need interventional therapeutics - has completed a consult for intranasal ketamine, awaiting to hear back from [facility]. Patient denies current suicidal ideation, however his current risk of another serious cutting episode is high in the setting of rapid mood changes, unknown triggers, and inability to control behavior.  Original plan was to get set up with TMS, this would be an appropriate course of action.  5/19: Patient appears unchanged and yesterday.  Mood is good, unchanged from yesterday.  Appears flat.  Appears to be tolerating medication increase well. LDL remains elevated from previous draw one year ago 137 --> 153. No indication for changing current med regimen. TSH 1.9 WNL. Awaiting A1c. We will continue to monitor over the next 48 to 72 hours.  Patient interested in discharge on Thursday as not to miss his therapy appointment.   Diagnoses / Active Problems: Major depressive disorder, recurrent, severe, without psychotic features, in current episode Generalized anxiety disorder Social anxiety disorder Autism spectrum disorder (level 1) assistance Borderline personality traits Nonsuicidal  self-injury Requiring PLAN:   Safety and Monitoring: -  VOLUNTARY  admission to inpatient psychiatric unit for safety, stabilization and treatment. - Daily  contact with patient to assess and evaluate symptoms and progress in treatment - Patient's case to be discussed in multi-disciplinary team meeting -  Observation Level : q15 minute checks -  Vital signs:  q12 hours -  Precautions: suicide, elopement, and assault   2. Psychiatric Diagnoses and Treatment:     #Major depressive disorder, recurrent, severe, without psychotic features, in current episode #Generalized anxiety disorder #Impulsive behavior/nonsuicidal self-injury #Autism spectrum disorder (level 1) #Borderline personality traits - Continue home Abilify  15 mg daily - Continue Lexapro  20 mg daily for depressed mood. - Continue home buspirone  10 mg twice daily - Continue home propranolol  10 mg nightly - Continue home metformin  500 mg every morning with breakfast - Continue home naltrexone  50 mg daily - Labs: Awaiting A1c.  - The risks/benefits/side-effects/alternatives to this medication were discussed in detail with the patient and time was given for questions. The patient consents to medication trial.  - Metabolic profile and EKG monitoring obtained while on an atypical antipsychotic  BMI: 36.61 TSH: Ordered, awaiting  Lipid panel: Ordered, awaiting  HbgA1c: Ordered, awaiting  QTc: 379 - Encouraged patient to participate in unit milieu and in scheduled group therapies  - Short Term Goals: Ability to identify changes in lifestyle to reduce recurrence of condition will improve, Ability to verbalize feelings will improve, Ability to disclose and discuss suicidal ideas, Ability to demonstrate self-control will improve, Ability to identify and develop effective coping behaviors will improve, Ability to maintain clinical measurements within normal limits will improve, Compliance with prescribed medications will improve, and  Ability to identify triggers associated with substance abuse/mental health issues will improve - Long Term Goals: Improvement in symptoms so as ready for discharge   Other PRNS: Tylenol , Maalox, Milk of Magnesia, agitation medications (IM Zyprexa ), trazodone    Other labs reviewed on admission: UDS-, hCG negative, CBC unremarkable, ethanol negative, CMP unremarkable               3. Medical Issues Being Addressed:   # Superficial wrist laceration Made with pocketknife.  Afebrile.  No indication of systemic complication. - Continue to monitor.  #Hyperlipidemia #Hypertriglyceridemia  LDL: 153. Triglicerides: 212. Elevated slightly from 1 year ago.  - Will need PCP follow-up.   #Eczema Complained of new rash this a.m. - Start triamcinolone  0.1 p.o. twice daily as needed   4. Discharge Planning:    - Estimated discharge date: 3-5 days - Social work and case management to assist with discharge planning and identification of hospital follow-up needs prior to discharge. - Discharge concerns: Need to establish a safety plan; medication compliance and effectiveness. - Discharge goals: Return home with outpatient referrals for mental health follow-up including medication management/psychotherapy.   I certify that inpatient services furnished can reasonably be expected to improve the patient's condition.     NB: This note was created using a voice recognition software as a result there may be grammatical errors inadvertently enclosed that do not reflect the nature of this encounter. Every attempt is made to correct such errors.   Vicente Graham, MD PGY-1, Psychiatry Residency  5/19/20259:22 AM

## 2024-01-30 NOTE — Progress Notes (Signed)
 Gorran did not attend wrap up AA group.

## 2024-01-31 NOTE — Plan of Care (Signed)

## 2024-01-31 NOTE — Plan of Care (Signed)

## 2024-01-31 NOTE — [facility] Suicide Risk Assessment (Addendum)
 [facility] INPATIENT:  Family/Significant Other Suicide Prevention Education  Suicide Prevention Education:  Education Completed; Hilton Lucky -  [phone] (mother), has been identified by the patient as the family member/significant other with whom the patient will be residing, and identified as the person(s) who will aid the patient in the event of a mental health crisis (suicidal ideations/suicide attempt).  With written consent from the patient, the family member/significant other has been provided the following suicide prevention education, prior to the and/or following the discharge of the patient.  CSW spoke with Pt's mother and made her aware of anticipated discharge for tomorrow. Mother confirms no access to weapons. Mother also confirmed Pt's standing appointment with therapist on Thursdays at 5PM. Mother did have question about medication changes and CSW made her aware that provider is planning to reach out today. Mother is able to provide discharge in the afternoon, 2PM, 3PM at the latest.   The suicide prevention education provided includes the following: Suicide risk factors Suicide prevention and interventions National Suicide Hotline telephone number [facility] assessment telephone number [facility] Emergency Assistance [facility] and/or Residential Mobile Crisis Unit telephone number  Request made of family/significant other to: Remove weapons (e.g., guns, rifles, knives), all items previously/currently identified as safety concern.   Remove drugs/medications (over-the-counter, prescriptions, illicit drugs), all items previously/currently identified as a safety concern.  The family member/significant other verbalizes understanding of the suicide prevention education information provided.  The family member/significant other agrees to remove the items of safety concern listed above.  Dylann Gallier N Primo Innis, LCSW 01/31/2024, 9:45 AM

## 2024-01-31 NOTE — BH Assessment (Signed)
 Patient calm and cooperative this shift, no problems or complaints. Attended wrap-up group and had snack with other patients. Denies any SI/HI and AVH, affect flat, pt did not want to talk, but was polite.

## 2024-01-31 NOTE — [facility] Group Notes (Signed)
 Spiritual care group on grief and loss facilitated by Chaplain Nick Barman, [facility]  Group Goal: Support / Education around grief and loss  Members engage in facilitated group support and psycho-social education.  Group Description:  Following introductions and group rules, group members engaged in facilitated group dialogue and support around topic of loss, with particular support around experiences of loss in their lives. Group Identified types of loss (relationships / self / things) and identified patterns, circumstances, and changes that precipitate losses. Reflected on thoughts / feelings around loss, normalized grief responses, and recognized variety in grief experience. Group encouraged individual reflection on safe space and on the coping skills that they are already utilizing.  Group drew on Adlerian / Rogerian and narrative framework  Patient Progress: Antonio [name] was present in the first part of group and engaged in group conversation.  He stepped out when there was tension between two other patients and did not return to group.  I followed up with him and he stated that he was okay, but that he didn't want to be in the room when there was tension.

## 2024-01-31 NOTE — Progress Notes (Signed)
 [facility] MD Progress Note  01/31/2024 8:38 AM LESLEE HAUETER  MRN:  [id number]  Principal Problem: Suicidal ideations Diagnosis: Principal Problem:   Suicidal ideations   Reason for Admission:  Antonio [name] is a 18 y.o. adult  with a past psychiatric history of ASD level 1, social anxiety disorder, major depressive disorder with multiple psychiatric hospitalizations in the context of suicide by cutting/OD/swallowing blade, borderline personality traits as well as self-mutilating behaviors. Patient initially arrived to [facility] on 5/17 for suspected suicide attempt with left wrist laceration with pocketknife on the a.m. of 5/17 -- on further investigation, likely nonsuicidal self-injury secondary to mood lability then an attempt to end his life.  He was then evaluated by telepsychiatry team in the emergency department, who recommended inpatient hospitalization as patient had poor insight as to why he cut arm with pocketknife. Admitted to [facility] on 5/17 for acute safety concerns, impaired functioning, and severe substance-induced psychosis or mood disturbances.  (admitted on 01/28/2024, total  LOS: 3 days )  Yesterday, the psychiatry team made following recommendations:   - Continue home Abilify  15 mg daily - Continue Lexapro  20 mg daily for depressed mood. - Continue home buspirone  10 mg twice daily - Continue home propranolol  10 mg nightly - Continue home metformin  500 mg every morning with breakfast - Continue home naltrexone  50 mg daily  Pertinent information discussed during bed progression: Slept well.  Denied SI, HI, AVH.  PRNs required overnight: Trazodone  50 mg x1.   Information Obtained Today During Patient Interview:   On interview, mood is "good".  No changes in yesterday.  Patient said he is been working on his poetry, which he would like to share.  Did not sleep well, but attributes this to likely OSA.  Also said that he has had some stomach pain related to food, but this is  his baseline and is undergoing extensive outpatient workup.  No medication side effects or somatic symptoms.  Denied suicidal and homicidal ideation.  Again states that has not had suicidal ideation for approximately 1 week, and did not have suicidal ideation at the time of self mutilating with pocketknife.  No auditory visual hallucinations.  Is amenable to discharge over the next couple of days.  Amenable to speaking with his mother.  Collateral information via mother, Philippe Brazen [phone]): Only spoke with him a couple of times. Seemed okay. Seemed in good spirits when speaking with TJ on the phone: per mom, "I think it was an impulse thing. I don't think he meant to commit suicide or anything like that. I think it was to relieve his thoughts and the numbness that he's feeling." Awaiting approval for esketamine. Would be okay with tomorrow vs Thursday. That's the scary part about TJ. How he seems now is how he seems all the time. Therapist says he has "different personalities." Has a part of himself that wants him to die -- can seem totally fine, then in the middle of the night, then something like this happen. Sometimes doesn't remember why he was feeling that way. Very complex situation. Amenable to discharge Thursday so that he can follow-up with outpatient therapy late rthat day.l   Past Psychiatric History: Current psychiatrist: Dr. Gaylia Kayser previously, currently in between Current therapist: Weekly DBT therapy with Ms. Earla Glassman Previous psychiatric diagnoses: Major depressive disorder, recurrent, severe without psychosis; multiple suicide attempts including swallowing a razor blade and deep cutting, nonsuicidal self-injury, autism spectrum disorder (level 1), social anxiety disorder, generalized anxiety disorder,  gender dysphoria Current psychiatric medications: Lexapro  10 mg, Abilify  15 mg with metformin  500 mg daily with breakfast, naltrexone  50 mg daily, BuSpar  10 mg twice daily,  propranolol  10 mg at bedtime, Atarax  and Seroquel  as needed, trazodone  as needed, undergoing endocrinology treatment for gender dysphoria, Psychiatric medication history/compliance: Mom handles medication, consistent with meds.  Previously tried Effexor , recently changed to Lexapro .  Also trialed Pristiq , Prozac , Zoloft , Wellbutrin , guanfacine , Latuda  Psychiatric hospitalization(s): 6 hospitalizations for suicide attempt beginning in 2022.  Flaccid noted to [facility] 11/2023.  Had a 1 month residential stay at [facility] in 2024. Psychotherapy history: None Neuromodulation history: Scheduled for TMS consult History of suicide (obtained from HPI): Multiple serious suicide attempts including overdoses, deep cutting, swelling razor History of homicide or aggression (obtained in HPI): Denies   Substance Abuse History: Alcohol: Denies Tobacco: Former, nicotine  Cannabis: Former IV drug use: Denies Prescription drug use: Denies Other illicit drugs: Denies Rehab history: Denies   Past Medical History: Medical diagnoses: Anaphylaxis to tree nuts, egg derived products, peanuts.  Asthma.  Eczema. Medications: Metformin , EpiPen , omeprazole  40 mg, testosterone  200 mg weekly Allergies: Tree nuts, a drive products, peanuts Hospitalizations: None Surgeries: Bilateral top of gender affirming surgery 2024 foot surgery after fracturing to, tonsillectomy Trauma: Denies Seizures: Denied as   Social History: Living situation: Home with mother and stepfather Education: Rising twelfth-grader Occupational history: None Marital status: None Children: None Legal: None Military: None   Access to firearms: Denies   Family Psychiatric History: Psychiatric diagnoses: Depression and anxiety in mother Suicide history: None known Substance use history: None known   Family Medical History:   Hypertension in maternal grandmother Past Medical History:  Diagnosis Date   Allergic rhinoconjunctivitis  06/06/2015   Allergy  with anaphylaxis due to food 06/06/2015   Asthma    Bupropion  overdose 01/16/2020   Complication of anesthesia    gets disoriented and shakes after surgery   Deliberate self-cutting 10/17/2018   Depression    Eczema    Fracture of 5th metatarsal    Gender dysphoria    Intentional overdose of drug in tablet form (HCC) 04/03/2020   MDD (major depressive disorder), recurrent severe, without psychosis (HCC) 11/02/2021   Moderate episode of recurrent major depressive disorder (HCC) 04/24/2020   Multiple allergies    Severe episode of recurrent major depressive disorder, without psychotic features (HCC) 01/02/2020   Suicidal ideations 08/31/2022   Suicide attempt by cutting of wrist (HCC) 07/28/2022   Family History:  Family History  Problem Relation Age of Onset   Depression Mother    Anxiety disorder Mother    Hypertension Maternal Grandmother    Allergic rhinitis Neg Hx    Angioedema Neg Hx    Atopy Neg Hx    Eczema Neg Hx    Immunodeficiency Neg Hx    Urticaria Neg Hx     Current Medications: Current Facility-Administered Medications  Medication Dose Route Frequency Provider Last Rate Last Admin   acetaminophen  (TYLENOL ) tablet 650 mg  650 mg Oral Q6H PRN Dorthea Gauze, NP       alum & mag hydroxide-simeth (MAALOX/MYLANTA) 200-200-20 MG/5ML suspension 30 mL  30 mL Oral Q4H PRN Dorthea Gauze, NP       ARIPiprazole  (ABILIFY ) tablet 15 mg  15 mg Oral QHS Ajibola, Ene A, NP   15 mg at 01/30/24 2123   busPIRone  (BUSPAR ) tablet 10 mg  10 mg Oral BID Gwyndolyn Lerner, MD   10 mg at 01/31/24 0758   escitalopram  (LEXAPRO )  tablet 20 mg  20 mg Oral Daily Gwyndolyn Lerner, MD   20 mg at 01/31/24 9147   magnesium  hydroxide (MILK OF MAGNESIA) suspension 30 mL  30 mL Oral Daily PRN Dorthea Gauze, NP       metFORMIN  (GLUCOPHAGE -XR) 24 hr tablet 500 mg  500 mg Oral Q breakfast Gwyndolyn Lerner, MD   500 mg at 01/31/24 8295   naltrexone  (DEPADE) tablet 50 mg  50 mg  Oral Daily Gwyndolyn Lerner, MD   50 mg at 01/31/24 6213   OLANZapine  (ZYPREXA ) injection 10 mg  10 mg Intramuscular TID PRN Dorthea Gauze, NP       OLANZapine  (ZYPREXA ) injection 5 mg  5 mg Intramuscular TID PRN Dorthea Gauze, NP       OLANZapine  zydis (ZYPREXA ) disintegrating tablet 5 mg  5 mg Oral TID PRN Dorthea Gauze, NP       pantoprazole  (PROTONIX ) EC tablet 80 mg  80 mg Oral Daily Gwyndolyn Lerner, MD   80 mg at 01/31/24 0865   propranolol  (INDERAL ) tablet 10 mg  10 mg Oral QHS Ivory Bail, MD   10 mg at 01/30/24 2123   traZODone  (DESYREL ) tablet 50-100 mg  50-100 mg Oral QHS PRN Ajibola, Ene A, NP   50 mg at 01/30/24 2123   triamcinolone  cream (KENALOG ) 0.1 % cream   Topical BID PRN Gwyndolyn Lerner, MD   Given at 01/30/24 1635    Lab Results:  Results for orders placed or performed during the hospital encounter of 01/28/24 (from the past 48 hours)  TSH     Status: None   Collection Time: 01/30/24  6:19 AM  Result Value Ref Range   TSH 1.996 0.350 - 4.500 uIU/mL    Comment: Performed by a 3rd Generation assay with a functional sensitivity of <=0.01 uIU/mL. Performed at [facility], [address] [place], [place] [address]   Lipid panel     Status: Abnormal   Collection Time: 01/30/24  6:19 AM  Result Value Ref Range   Cholesterol 212 (H) 0 - 200 mg/dL   Triglycerides 629 <528 mg/dL   HDL 38 (L) >41 mg/dL   Total CHOL/HDL Ratio 5.6 RATIO   VLDL 21 0 - 40 mg/dL   LDL Cholesterol 324 (H) 0 - 99 mg/dL    Comment:        Total Cholesterol/HDL:CHD Risk Coronary Heart Disease Risk Table                     Men   Women  1/2 Average Risk   3.4   3.3  Average Risk       5.0   4.4  2 X Average Risk   9.6   7.1  3 X Average Risk  23.4   11.0        Use the calculated Patient Ratio above and the CHD Risk Table to determine the patient's CHD Risk.        ATP III CLASSIFICATION (LDL):  <100     mg/dL   Optimal  401-027  mg/dL   Near or Above                     Optimal  130-159  mg/dL   Borderline  253-664  mg/dL   High  >403     mg/dL   Very High Performed at [facility], [address], [place], [place] [address]   Hemoglobin A1c  Status: None   Collection Time: 01/30/24  6:19 AM  Result Value Ref Range   Hgb A1c MFr Bld 5.1 4.8 - 5.6 %    Comment: (NOTE) Pre diabetes:          5.7%-6.4%  Diabetes:              >6.4%  Glycemic control for   <7.0% adults with diabetes    Mean Plasma Glucose 99.67 mg/dL    Comment: Performed at [facility], [address], [place], [place] [address]    Blood Alcohol level:  Lab Results  Component Value Date   [facility] <15 01/28/2024   ETH <10 11/14/2023    Metabolic Labs: Lab Results  Component Value Date   HGBA1C 5.1 01/30/2024   MPG 99.67 01/30/2024   MPG 99.67 12/30/2022   Lab Results  Component Value Date   PROLACTIN 10.0 09/01/2022   PROLACTIN 10.3 04/16/2022   Lab Results  Component Value Date   CHOL 212 (H) 01/30/2024   TRIG 105 01/30/2024   HDL 38 (L) 01/30/2024   CHOLHDL 5.6 01/30/2024   VLDL 21 01/30/2024   LDLCALC 153 (H) 01/30/2024   LDLCALC 137 (H) 12/30/2022    Physical Findings: AIMS: No  CIWA:    COWS:     Psychiatric Specialty Exam:  Presentation  General Appearance: Appropriate for Environment; Casual  Eye Contact:Good  Speech:Clear and Coherent  Speech Volume:Normal  Handedness:Right   Mood and Affect  Mood:Euthymic  Affect:Appropriate; Congruent   Thought Process  Thought Processes:Linear  Descriptions of Associations:Intact  Orientation:Full (Time, Place and Person)  Thought Content:Logical  History of Schizophrenia/Schizoaffective disorder:No  Duration of Psychotic Symptoms:N/A (none noted)  Hallucinations:Hallucinations: None  Ideas of Reference:None  Suicidal Thoughts:Suicidal Thoughts: No  Homicidal Thoughts:Homicidal Thoughts: No   Sensorium  Memory:Immediate  Fair  Judgment:Fair  Insight:Present   Executive Functions  Concentration:Fair  Attention Span:Fair  Recall:Fair  Fund of Knowledge:Fair  Language:Fair   Psychomotor Activity  Psychomotor Activity:Psychomotor Activity: Normal   Assets  Assets:Communication Skills; Financial Resources/Insurance; Housing; Resilience; Social Support   Sleep  Sleep:Sleep: Good Number of Hours of Sleep: 8    Physical Exam: Physical Exam Vitals reviewed.  Constitutional:      Appearance: He is obese.  Pulmonary:     Effort: Pulmonary effort is normal. No respiratory distress.  Musculoskeletal:        General: Normal range of motion.  Neurological:     Mental Status: He is alert.  Psychiatric:        Mood and Affect: Mood normal.    Review of Systems  Constitutional:  Negative for chills and fever.  Gastrointestinal:  Negative for constipation, diarrhea, nausea and vomiting.   Blood pressure 125/88, pulse 83, temperature 98.3 F (36.8 C), temperature source Oral, resp. rate 16, height 5\' 5"  (1.651 m), weight 99.8 kg, SpO2 100%. Body mass index is 36.61 kg/m.  Treatment Plan Summary: Daily contact with patient to assess and evaluate symptoms and progress in treatment and Medication management    ASSESSMENT:   5/18: Antonio [name] is a 18 year old assigned male at birth transgender male who presents with repeated cutting of left forearm with pocketknife.  He has a diagnosis of autism spectrum disorder (level 1) requiring assistance, major depressive disorder, severe, recurring, without psychotic features, in current episode, as well as nonsuicidal self-injury and borderline personality traits.  He was most recently seen for a similar presentation in March 2025, and has been hospitalized for similar reasons (  suicidal gestures/cutting) approximately 6 times over the last 3 years.  Per patient, the episodes of self-harm occur "like clockwork" and that "it was about that time."  On discussion  with mom, it appears that he also did not have suicidal ideation around the time of the cutting, but that it was mainly a result of an uncontrollable impulses: "If I really wanted to die, I would.". Patient has very little insight as to what feelings/triggers led up to this behavior. We will restart his home medications and increase Lexapro  from 10 mg to 20 mg to target depressive symptoms.  Will likely need interventional therapeutics - has completed a consult for intranasal ketamine, awaiting to hear back from [facility]. Patient denies current suicidal ideation, however his current risk of another serious cutting episode is high in the setting of rapid mood changes, unknown triggers, and inability to control behavior.  Original plan was to get set up with TMS, this would be an appropriate course of action.  5/19: Patient appears unchanged and yesterday.  Mood is good, unchanged from yesterday.  Appears flat.  Appears to be tolerating medication increase well. LDL remains elevated from previous draw one year ago 137 --> 153. No indication for changing current med regimen. TSH 1.9 WNL. Awaiting A1c. We will continue to monitor over the next 48 to 72 hours.  Patient interested in discharge on Thursday as not to miss his therapy appointment.  5/20: Continues to be asymptomatic. Continues to deny suicidal ideation.  Spoke with mother who agrees that cutting attempt likely was not an attempt to end patient's life, but instead to relieve stress.  Mother is minimal to discharge tomorrow or Thursday.  Prefers Thursday, as it would provide continuity of care between patient discharge and follow up with long-term therapist, who is very similar with patient.  Of note, patient often seems quite well, but can turn very quickly towards suicidal thinking without obvious trigger.  We will need follow-up with more definitive care, previously referred to TMS.   Diagnoses / Active Problems: Major depressive disorder,  recurrent, severe, without psychotic features, in current episode Generalized anxiety disorder Social anxiety disorder Autism spectrum disorder (level 1) assistance Borderline personality traits Nonsuicidal self-injury Requiring PLAN:   Safety and Monitoring: -  VOLUNTARY  admission to inpatient psychiatric unit for safety, stabilization and treatment. - Daily contact with patient to assess and evaluate symptoms and progress in treatment - Patient's case to be discussed in multi-disciplinary team meeting -  Observation Level : q15 minute checks -  Vital signs:  q12 hours -  Precautions: suicide, elopement, and assault   2. Psychiatric Diagnoses and Treatment:     #Major depressive disorder, recurrent, severe, without psychotic features, in current episode #Generalized anxiety disorder #Impulsive behavior/nonsuicidal self-injury #Autism spectrum disorder (level 1) #Borderline personality traits - Continue home Abilify  15 mg daily - Continue Lexapro  20 mg daily for depressed mood. - Continue home buspirone  10 mg twice daily - Continue home propranolol  10 mg nightly - Continue home metformin  500 mg every morning with breakfast - Continue home naltrexone  50 mg daily - Labs: A1c 5.1% - The risks/benefits/side-effects/alternatives to this medication were discussed in detail with the patient and time was given for questions. The patient consents to medication trial.  - Metabolic profile and EKG monitoring obtained while on an atypical antipsychotic  BMI: 36.61 TSH: 1.99 WNL LDL: 153 HbgA1c: 5.1% QTc: 379 - Encouraged patient to participate in unit milieu and in scheduled group therapies  - Short  Term Goals: Ability to identify changes in lifestyle to reduce recurrence of condition will improve, Ability to verbalize feelings will improve, Ability to disclose and discuss suicidal ideas, Ability to demonstrate self-control will improve, Ability to identify and develop effective coping  behaviors will improve, Ability to maintain clinical measurements within normal limits will improve, Compliance with prescribed medications will improve, and Ability to identify triggers associated with substance abuse/mental health issues will improve - Long Term Goals: Improvement in symptoms so as ready for discharge   Other PRNS: Tylenol , Maalox, Milk of Magnesia, agitation medications (IM Zyprexa ), trazodone    Other labs reviewed on admission: UDS-, hCG negative, CBC unremarkable, ethanol negative, CMP unremarkable               3. Medical Issues Being Addressed:   # Superficial wrist laceration Made with pocketknife.  Afebrile.  No indication of systemic complication. - Continue to monitor.  #Hyperlipidemia #Hypertriglyceridemia  LDL: 153. Triglicerides: 212. Elevated slightly from 1 year ago.  - Will need PCP follow-up.   #Eczema Complained of new rash this a.m. - Continue triamcinolone  0.1 p.o. twice daily as needed   4. Discharge Planning:    - Estimated discharge date: 3-5 days - Social work and case management to assist with discharge planning and identification of hospital follow-up needs prior to discharge. - Discharge concerns: Need to establish a safety plan; medication compliance and effectiveness. - Discharge goals: Return home with outpatient referrals for mental health follow-up including medication management/psychotherapy.   I certify that inpatient services furnished can reasonably be expected to improve the patient's condition.     NB: This note was created using a voice recognition software as a result there may be grammatical errors inadvertently enclosed that do not reflect the nature of this encounter. Every attempt is made to correct such errors.   Vicente Graham, MD PGY-1, Psychiatry Residency  5/20/20258:38 AM

## 2024-01-31 NOTE — Group Note (Signed)
 Recreation Therapy Group Note   Group Topic:Animal Assisted Therapy   Group Date: 01/31/2024 Start Time: 0945 End Time: 1030 Facilitators: Cela Newcom-McCall, LRT,CTRS Location: [address] Dayroom   Animal-Assisted Activity (AAA) Program Checklist/Progress Notes Patient Eligibility Criteria Checklist & Daily Group note for Rec Tx Intervention  AAA/T Program Assumption of Risk Form signed by Patient/ or Parent Legal Guardian Yes  Patient is free of allergies or severe asthma Yes  Patient reports no fear of animals Yes  Patient reports no history of cruelty to animals Yes  Patient understands his/her participation is voluntary Yes  Patient washes hands before animal contact Yes  Patient washes hands after animal contact Yes  Behavioral Response: Appropriate   Education: Hand Washing, Appropriate Animal Interaction   Education Outcome: Acknowledges education.    Affect/Mood: Appropriate   Participation Level: Engaged   Participation Quality: Independent   Behavior: Appropriate   Speech/Thought Process: Focused   Insight: Good   Judgement: Good   Modes of Intervention: Teaching laboratory technician   Patient Response to Interventions:  Engaged   Education Outcome:  In group clarification offered    Clinical Observations/Individualized Feedback: Patient attended session and interacted appropriately with therapy dog and peers. Patient asked appropriate questions about therapy dog and his training. Patient shared stories about their pets at home with group.   Plan: Continue to engage patient in RT group sessions 2-3x/week.   Janis Sol-McCall, LRT,CTRS 01/31/2024 12:36 PM

## 2024-01-31 NOTE — Progress Notes (Signed)
 D: Patient is alert, oriented, and cooperative. Denies SI, HI, AVH, and verbally contracts for safety. Patient reports he slept fair last night without sleeping medication. Patient reports his appetite as good, energy level as normal, and concentration as good. Patient rates his depression 0/10, hopelessness 0/10, and anxiety 0/10. Patient reports abdominal pain 3/10 that decreased with tylenol  and maalox to 1/10.    A: Scheduled medications administered per MD order. PRN tylenol  and maalox administered. Support provided. Patient educated on safety on the unit and medications. Routine safety checks every 15 minutes. Patient stated understanding to tell nurse about any new physical symptoms. Patient understands to tell staff of any needs.     R: No adverse drug reactions noted. Patient remains safe at this time and will continue to monitor.    01/31/24 1100  Psych Admission Type (Psych Patients Only)  Admission Status Voluntary  Psychosocial Assessment  Patient Complaints None  Eye Contact Fair  Facial Expression Flat  Affect Flat  Speech Logical/coherent  Interaction Assertive  Motor Activity Slow  Appearance/Hygiene Unremarkable  Behavior Characteristics Appropriate to situation;Cooperative  Mood Depressed  Thought Process  Coherency WDL  Content WDL  Delusions None reported or observed  Perception WDL  Hallucination None reported or observed  Judgment Poor  Confusion None  Danger to Self  Current suicidal ideation? Denies  Agreement Not to Harm Self Yes  Description of Agreement verbal  Danger to Others  Danger to Others None reported or observed

## 2024-01-31 NOTE — Group Note (Signed)
 LCSW Group Therapy Note   Group Date: 01/31/2024 Start Time: 1100 End Time: 1200   Participation:  patient was present  Type of Therapy:  Group Therapy  Topic:  "Healing Flames: Navigating Anger with Compassion"  Objective:  Foster self-awareness and promote compassion toward oneself and others when dealing with anger.  Goals:  Help participants understand the underlying emotions and needs fueling anger. Provide coping strategies for healthier emotional expression and anger management.  Summary: This session explored anger as a volcano--an explosion driven by deeper feelings and unmet needs. Participants learned to identify anger triggers and underlying emotions, then practiced coping strategies like deep breathing, physical activity, and journaling. The group discussed healthy ways to manage anger before it escalates, using both personal reflection and shared experiences.  Therapeutic Modalities: Cognitive Behavioral Therapy (CBT): Challenging thoughts that fuel anger. Mindfulness: Increasing awareness of emotions and sensations.   Atzin Buchta O Da Michelle, LCSWA 01/31/2024  12:41 PM

## 2024-01-31 NOTE — Plan of Care (Signed)
   Problem: Education: Goal: Knowledge of [facility] Education information/materials will improve Outcome: Progressing Goal: Emotional status will improve Outcome: Progressing Goal: Mental status will improve Outcome: Progressing

## 2024-01-31 NOTE — Group Note (Signed)
 Date:  01/31/2024 Time:  10:08 AM  Group Topic/Focus:  Goals Group:   The focus of this group is to help patients establish daily goals to achieve during treatment and discuss how the patient can incorporate goal setting into their daily lives to aide in recovery.    Participation Level:  Active  Participation Quality:  Attentive  Affect:  Appropriate  Cognitive:  Alert  Insight: Good  Engagement in Group:  Engaged  Modes of Intervention:  Education  Additional Comments:  Antonio [name] engaged in group and shared his poem with the group.  [name] 01/31/2024, 10:08 AM

## 2024-02-01 MED ORDER — ESCITALOPRAM OXALATE 20 MG PO TABS: 20.0000 mg | ORAL_TABLET | Freq: Every day | ORAL | 0 refills | Status: DC

## 2024-02-01 NOTE — Plan of Care (Signed)
   Problem: Education: Goal: Emotional status will improve Outcome: Progressing Goal: Mental status will improve Outcome: Progressing   Problem: Activity: Goal: Interest or engagement in activities will improve Outcome: Progressing Goal: Sleeping patterns will improve Outcome: Progressing

## 2024-02-01 NOTE — Progress Notes (Signed)
  [facility] Adult Case Management Discharge Plan :  Will you be returning to the same living situation after discharge:  Yes,  home with mother At discharge, do you have transportation home?: Yes,  mother to provide Do you have the ability to pay for your medications: Yes,  active insurance benefits  Release of information consent forms completed and in the chart;  Patient's signature needed at discharge.  Patient to Follow up at:  Follow-up Information     [facility] Follow up on 02/15/2024.   Specialty: Behavioral Health Why: You have an appointment on 02/15/24 at 10:30 am for medication management services. Contact information: [address] [place]  [address] [phone]        Counseling, Estel Heir. Go on 02/02/2024.   Why: You have an appointment with your therapist, Earla Glassman on Thursday, 5/22 at [facility]. Contact information: [address] [place] [address] [phone]                 Next level of care provider has access to [facility]:no  Safety Planning and Suicide Prevention discussed: Yes,  completed with mother on 01/31/24   Has patient been referred to the Quitline?: Patient does not use tobacco/nicotine  products  Patient has been referred for addiction treatment: No known substance use disorder.  Seham Gardenhire N Clary Meeker, LCSW 02/01/2024, 3:04 PM

## 2024-02-01 NOTE — Progress Notes (Signed)
   02/01/24 0826  Psych Admission Type (Psych Patients Only)  Admission Status Involuntary  Psychosocial Assessment  Patient Complaints None  Eye Contact Fair  Facial Expression Flat;Sad  Affect Flat;Sad  Speech Logical/coherent  Interaction Assertive  Motor Activity Slow  Appearance/Hygiene Unremarkable  Behavior Characteristics Cooperative;Appropriate to situation  Mood Depressed;Sad  Thought Process  Coherency WDL  Content WDL  Delusions None reported or observed  Perception WDL  Hallucination None reported or observed  Judgment Impaired  Confusion None  Danger to Self  Current suicidal ideation? Denies  Agreement Not to Harm Self Yes  Description of Agreement Verbal  Danger to Others  Danger to Others None reported or observed  Danger to Others Abnormal  Harmful Behavior to others No threats or harm toward other people  Destructive Behavior No threats or harm toward property

## 2024-02-01 NOTE — [facility] Group Notes (Signed)
 Spirituality Group   Description: Participant directed exploration of values, beliefs and meaning   Following a brief framework of chaplain's role and ground rules of group behavior, participants are invited to share concerns or questions that engage spiritual life. Emphasis placed on common themes and shared experiences and ways to make meaning and clarify living into one's values.   Theory/Process/Goal: Utilize the theoretical framework of group therapy established by Derrell Flight, Relational Cultural Theory and Rogerian approaches to facilitate relational empathy and use of the "here and now" to foster reflection, self-awareness, and sharing.   Observations: Antonio [name] was an active participant in the group discussion.  Antonio [name] Aly, M.Div [phone]

## 2024-02-01 NOTE — Group Note (Signed)
 Recreation Therapy Group Note   Group Topic:Communication  Group Date: 02/01/2024 Start Time: 1610 End Time: 1000 Facilitators: Job Holtsclaw-McCall, LRT,CTRS Location: [address] Dayroom   Group Topic: Communication, Team Building, Problem Solving  Goal Area(s) Addresses:  Patient will effectively work with peer towards shared goal.  Patient will identify skills used to make activity successful.  Patient will identify how skills used during activity can be applied to reach post d/c goals.   Behavioral Response: Appropriate  Intervention: STEM Activity- Glass blower/designer  Activity: Tallest [employer]. In teams of 5-6, patients were given 11 craft pipe cleaners. Using the materials provided, patients were instructed to compete again the opposing team(s) to build the tallest free-standing structure from floor level. The activity was timed; difficulty increased by Clinical research associate as Production designer, theatre/television/film continued.  Systematically resources were removed with additional directions for example, placing one arm behind their back, working in silence, and shape stipulations. LRT facilitated post-activity discussion reviewing team processes and necessary communication skills involved in completion. Patients were encouraged to reflect how the skills utilized, or not utilized, in this activity can be incorporated to positively impact support systems post discharge.  Education: Pharmacist, community, Scientist, physiological, Discharge Planning   Education Outcome: Acknowledges education/In group clarification offered/Needs additional education.    Affect/Mood: Appropriate   Participation Level: Engaged   Participation Quality: Independent   Behavior: Appropriate   Speech/Thought Process: Focused   Insight: Good   Judgement: Good   Modes of Intervention: STEM Activity   Patient Response to Interventions:  Engaged   Education Outcome:  In group clarification offered    Clinical  Observations/Individualized Feedback: Pt was bright and engaged. Pt worked well with peers in completing the task. Pt was patient, offered suggestions and assisted were needed.    Plan: Continue to engage patient in RT group sessions 2-3x/week.   Brayden Brodhead-McCall, LRT,CTRS 02/01/2024 11:05 AM

## 2024-02-01 NOTE — Discharge Summary (Signed)
 Physician Discharge Summary Note  Patient:  Antonio [name] is an 18 y.o., adult MRN:  [id number] DOB:  [birth year] Patient phone:  [phone] (home)  Patient address:   [address] [place] [facility] [address],  Total Time spent with patient: 2.5 hours  Date of Admission:  01/28/2024 Date of Discharge: 02/01/2024  Reason for Admission:  Concern for suicide attempt  Principal Problem: Suicidal ideations Discharge Diagnoses: Principal Problem:   Suicidal ideations Active Problems:   Deliberate self-cutting   Past Psychiatric History: Current psychiatrist: Dr. Gaylia Kayser previously, currently in between Current therapist: Weekly DBT therapy with Ms. Earla Glassman Previous psychiatric diagnoses: Major depressive disorder, recurrent, severe without psychosis; multiple suicide attempts including swallowing a razor blade and deep cutting, nonsuicidal self-injury, autism spectrum disorder (level 1), social anxiety disorder, generalized anxiety disorder, gender dysphoria Current psychiatric medications: Lexapro  10 mg, Abilify  15 mg with metformin  500 mg daily with breakfast, naltrexone  50 mg daily, BuSpar  10 mg twice daily, propranolol  10 mg at bedtime, Atarax  and Seroquel  as needed, trazodone  as needed, undergoing endocrinology treatment for gender dysphoria, Psychiatric medication history/compliance: Mom handles medication, consistent with meds.  Previously tried Effexor , recently changed to Lexapro .  Also trialed Pristiq , Prozac , Zoloft , Wellbutrin , guanfacine , Latuda  Psychiatric hospitalization(s): 6 hospitalizations for suicide attempt beginning in 2022.  Flaccid noted to [facility] 11/2023.  Had a 1 month residential stay at [facility] in 2024. Psychotherapy history: None Neuromodulation history: Scheduled for TMS consult History of suicide (obtained from HPI): Multiple serious suicide attempts including overdoses, deep cutting, swelling razor History of homicide or  aggression (obtained in HPI): Denies  Past Medical History:  Past Medical History:  Diagnosis Date   Allergic rhinoconjunctivitis 06/06/2015   Allergy  with anaphylaxis due to food 06/06/2015   Asthma    Bupropion  overdose 01/16/2020   Complication of anesthesia    gets disoriented and shakes after surgery   Deliberate self-cutting 10/17/2018   Depression    Eczema    Fracture of 5th metatarsal    Gender dysphoria    Intentional overdose of drug in tablet form (HCC) 04/03/2020   MDD (major depressive disorder), recurrent severe, without psychosis (HCC) 11/02/2021   Moderate episode of recurrent major depressive disorder (HCC) 04/24/2020   Multiple allergies    Severe episode of recurrent major depressive disorder, without psychotic features (HCC) 01/02/2020   Suicidal ideations 08/31/2022   Suicide attempt by cutting of wrist (HCC) 07/28/2022    Past Surgical History:  Procedure Laterality Date   SUPPRELIN  IMPLANT Left 01/22/2019   Procedure: SUPPRELIN  IMPLANT;  Surgeon: Verlena Glenn, MD;  Location: [facility];  Service: Pediatrics;  Laterality: Left;   SUPPRELIN  REMOVAL N/A 03/03/2020   Procedure: SUPPRELIN  REMOVAL;  Surgeon: Verlena Glenn, MD;  Location: [facility];  Service: Pediatrics;  Laterality: N/A;   TONSILLECTOMY     TYMPANOSTOMY TUBE PLACEMENT     Family History:  Family History  Problem Relation Age of Onset   Depression Mother    Anxiety disorder Mother    Hypertension Maternal Grandmother    Allergic rhinitis Neg Hx    Angioedema Neg Hx    Atopy Neg Hx    Eczema Neg Hx    Immunodeficiency Neg Hx    Urticaria Neg Hx    Family Psychiatric History: Psychiatric diagnoses: Depression and anxiety in mother Suicide history: None known Substance use history: None known  Social History:  Social History   Substance and Sexual Activity  Alcohol Use  No     Social History   Substance and Sexual Activity  Drug Use Not  Currently   Types: Marijuana    Social History   Socioeconomic History   Marital status: Single    Spouse name: Not on file   Number of children: Not on file   Years of education: Not on file   Highest education level: 12th grade  Occupational History   Not on file  Tobacco Use   Smoking status: Never   Smokeless tobacco: Never  Vaping Use   Vaping status: Former   Substances: Nicotine , Flavoring   Devices: Nicotine   Substance and Sexual Activity   Alcohol use: No   Drug use: Not Currently    Types: Marijuana   Sexual activity: Not Currently  Other Topics Concern   Not on file  Social History Narrative   Lives with mom and stepdad.      He is in 12th grade at [employer] 23-24 school year      He enjoys writing and listen to music, writes poetry   Social Drivers of Health   Financial Resource Strain: Patient Declined (08/29/2023)   Overall Financial Resource Strain (CARDIA)    Difficulty of Paying Living Expenses: Patient declined  Food Insecurity: No Food Insecurity (01/28/2024)   Hunger Vital Sign    Worried About Running Out of Food in the Last Year: Never true    Ran Out of Food in the Last Year: Never true  Transportation Needs: No Transportation Needs (01/28/2024)   PRAPARE - Administrator, Civil Service (Medical): No    Lack of Transportation (Non-Medical): No  Physical Activity: Unknown (08/29/2023)   Exercise Vital Sign    Days of Exercise per Week: 0 days    Minutes of Exercise per Session: Not on file  Stress: Stress Concern Present (08/29/2023)   [employer] of Occupational Health - Occupational Stress Questionnaire    Feeling of Stress : Very much  Social Connections: Socially Isolated (08/29/2023)   Social Connection and Isolation Panel [NHANES]    Frequency of Communication with Friends and Family: Once a week    Frequency of Social Gatherings with Friends and Family: Never    Attends Religious Services: Never     Database administrator or Organizations: No    Attends Engineer, structural: Not on file    Marital Status: Never married    Hospital Course:    5/18: Jerrilyn Moras is a 18 year old assigned male at birth transgender male who presents with repeated cutting of left forearm with pocketknife.  He has a diagnosis of autism spectrum disorder (level 1) requiring assistance, major depressive disorder, severe, recurring, without psychotic features, in current episode, as well as nonsuicidal self-injury and borderline personality traits.  He was most recently seen for a similar presentation in March 2025, and has been hospitalized for similar reasons (suicidal gestures/cutting) approximately 6 times over the last 3 years.  Per patient, the episodes of self-harm occur "like clockwork" and that "it was about that time."  On discussion with mom, it appears that he also did not have suicidal ideation around the time of the cutting, but that it was mainly a result of an uncontrollable impulses: "If I really wanted to die, I would.". Patient has very little insight as to what feelings/triggers led up to this behavior. We will restart his home medications and increase Lexapro  from 10 mg to 20 mg to target depressive symptoms.  Will likely  need interventional therapeutics - has completed a consult for intranasal ketamine, awaiting to hear back from [facility]. Patient denies current suicidal ideation, however his current risk of another serious cutting episode is high in the setting of rapid mood changes, unknown triggers, and inability to control behavior.  Original plan was to get set up with TMS, this would be an appropriate course of action.   5/19: Patient appears unchanged and yesterday.  Mood is good, unchanged from yesterday.  Appears flat.  Appears to be tolerating medication increase well. LDL remains elevated from previous draw one year ago 137 --> 153. No indication for changing current med regimen. TSH 1.9 WNL.  Awaiting A1c. We will continue to monitor over the next 48 to 72 hours.  Patient interested in discharge on Thursday as not to miss his therapy appointment.   5/20: Continues to be asymptomatic. Continues to deny suicidal ideation.  Spoke with mother who agrees that cutting attempt likely was not an attempt to end patient's life, but instead to relieve stress.  Mother is minimal to discharge tomorrow or Thursday.  Prefers Thursday, as it would provide continuity of care between patient discharge and follow up with long-term therapist, who is very similar with patient.  Of note, patient often seems quite well, but can turn very quickly towards suicidal thinking without obvious trigger.  We will need follow-up with more definitive care, previously referred to TMS.  5/21: Discussed with mother -- amenable to discharge today, no safety concerns.  Denied suicidal and homicidal ideations.  Denied auditory and visual visualizations.  Patient does poetry today, had a good and productive discussion about this.  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  Would prefer discharge today.  No acute safety concerns at this time.  Has not needed agitation protocol or any PRNs over the last 24 hours.  During the patient's hospitalization, patient had extensive initial psychiatric evaluation, and follow-up psychiatric evaluations every day.  Psychiatric diagnoses provided upon initial assessment:   Major depressive disorder, recurrent, severe, without psychotic features, in current episode Generalized anxiety disorder Social anxiety disorder Autism spectrum disorder (level 1) assistance Borderline personality traits Nonsuicidal self-injury  Patient's psychiatric medications were adjusted on admission:   - Continue home Abilify  15 mg daily - Increase home Lexapro  10 mg daily ? 20 mg daily for depressed mood. - Continue home buspirone  10 mg twice  daily - Continue home propranolol  10 mg nightly - Continue home metformin  500 mg every morning with breakfast - Continue home naltrexone  50 mg daily - Repeat EKG - Labs: A.m. A1c, lipid panel, TSH  During the hospitalization, other adjustments were made to the patient's psychiatric medication regimen:   - Continue home Abilify  15 mg daily - Continue Lexapro  20 mg daily for depressed mood. - Continue home buspirone  10 mg twice daily - Continue home propranolol  10 mg nightly - Continue home metformin  500 mg every morning with breakfast - Continue home naltrexone  50 mg daily  Patient's care was discussed during the interdisciplinary team meeting every day during the hospitalization.  The patient denied having side effects to prescribed psychiatric medication.  Gradually, patient started adjusting to milieu. The patient was evaluated each day by a clinical provider to ascertain response to treatment. Improvement was noted by the patient's report of decreasing symptoms, improved sleep and appetite, affect, medication tolerance, behavior, and participation in unit programming.  Patient was asked each day to complete a self  inventory noting mood, mental status, pain, new symptoms, anxiety and concerns.   Symptoms were reported as significantly decreased or resolved completely by discharge.  The patient reports that their mood is stable.  The patient denied having suicidal thoughts for more than 48 hours prior to discharge.  Patient denies having homicidal thoughts.  Patient denies having auditory hallucinations.  Patient denies any visual hallucinations or other symptoms of psychosis.  The patient was motivated to continue taking medication with a goal of continued improvement in mental health.   The patient reports their target psychiatric symptoms of mood lability responded well to the psychiatric medications, and the patient reports overall benefit other psychiatric hospitalization. Supportive  psychotherapy was provided to the patient. The patient also participated in regular group therapy while hospitalized. Coping skills, problem solving as well as relaxation therapies were also part of the unit programming.  Labs were reviewed with the patient, and abnormal results were discussed with the patient.  The patient is able to verbalize their individual safety plan to this provider.  # It is recommended to the patient to continue psychiatric medications as prescribed, after discharge from the hospital.    # It is recommended to the patient to follow up with your outpatient psychiatric provider and PCP.  # It was discussed with the patient, the impact of alcohol, drugs, tobacco have been there overall psychiatric and medical wellbeing, and total abstinence from substance use was recommended the patient.ed.  # Prescriptions provided or sent directly to preferred pharmacy at discharge. Patient agreeable to plan. Given opportunity to ask questions. Appears to feel comfortable with discharge.    # In the event of worsening symptoms, the patient is instructed to call the crisis hotline, 911 and or go to the nearest ED for appropriate evaluation and treatment of symptoms. To follow-up with primary care provider for other medical issues, concerns and or health care needs  # Patient was discharged home with a plan to follow up as noted below.  Physical Findings: AIMS:  , ,  ,  ,    CIWA:    COWS:     Musculoskeletal: Strength & Muscle Tone: within normal limits Gait & Station: normal Patient leans: N/A   Psychiatric Specialty Exam:  Presentation  General Appearance:  Appropriate for Environment; Casual  Eye Contact: Good  Speech: Clear and Coherent  Speech Volume: Normal  Handedness: Right   Mood and Affect  Mood: Euthymic  Affect: Appropriate   Thought Process  Thought Processes: Linear  Descriptions of Associations:Intact  Orientation:Full (Time, Place  and Person)  Thought Content:Logical  History of Schizophrenia/Schizoaffective disorder:No  Duration of Psychotic Symptoms:N/A (none noted)  Hallucinations:Hallucinations: None  Ideas of Reference:None  Suicidal Thoughts:Suicidal Thoughts: No  Homicidal Thoughts:Homicidal Thoughts: No   Sensorium  Memory: Immediate Fair  Judgment: Fair  Insight: Fair   Art therapist  Concentration: Fair  Attention Span: Fair  Recall: [employer] of Knowledge: Fair  Language: Fair   Psychomotor Activity  Psychomotor Activity: Psychomotor Activity: Normal   Assets  Assets: Communication Skills; Financial Resources/Insurance; Housing; Resilience; Social Support   Sleep  Sleep: Sleep: Good Number of Hours of Sleep: 7.25    Physical Exam: Physical Exam ROS Blood pressure 123/89, pulse 95, temperature 98.7 F (37.1 C), temperature source Oral, resp. rate 14, height 5\' 5"  (1.651 m), weight 99.8 kg, SpO2 99%. Body mass index is 36.61 kg/m.   Social History   Tobacco Use  Smoking Status Never  Smokeless Tobacco Never  Tobacco Cessation:  N/A, patient does not currently use tobacco products   Blood Alcohol level:  Lab Results  Component Value Date   [facility] <15 01/28/2024   ETH <10 11/14/2023    Metabolic Disorder Labs:  Lab Results  Component Value Date   HGBA1C 5.1 01/30/2024   MPG 99.67 01/30/2024   MPG 99.67 12/30/2022   Lab Results  Component Value Date   PROLACTIN 10.0 09/01/2022   PROLACTIN 10.3 04/16/2022   Lab Results  Component Value Date   CHOL 212 (H) 01/30/2024   TRIG 105 01/30/2024   HDL 38 (L) 01/30/2024   CHOLHDL 5.6 01/30/2024   VLDL 21 01/30/2024   LDLCALC 153 (H) 01/30/2024   LDLCALC 137 (H) 12/30/2022    See Psychiatric Specialty Exam and Suicide Risk Assessment completed by Attending Physician prior to discharge.  Discharge destination:  Home  Is patient on multiple antipsychotic therapies at discharge:  No     Allergies as of 02/01/2024       Reactions   Egg-derived Products Anaphylaxis   Justicia Adhatoda Anaphylaxis, Other (See Comments)   All "TREE NUTS"   Peanut-containing Drug Products Anaphylaxis        Medication List     TAKE these medications      Indication  albuterol  108 (90 Base) MCG/ACT inhaler Commonly known as: VENTOLIN  HFA Inhale 1-2 puffs into the lungs every 4 (four) hours as needed for wheezing or shortness of breath.  Indication: Asthma   ARIPiprazole  15 MG tablet Commonly known as: ABILIFY  Take 1 tablet (15 mg total) by mouth at bedtime.  Indication: MIXED BIPOLAR AFFECTIVE DISORDER   busPIRone  10 MG tablet Commonly known as: BUSPAR  Take 1 tablet (10 mg total) by mouth 2 (two) times daily.  Indication: Anxiety Disorder   EPINEPHrine  0.3 mg/0.3 mL Soaj injection Commonly known as: EpiPen  2-Pak Inject 0.3 mg into the muscle as needed for anaphylaxis (AS DIRECTED).  Indication: Life-Threatening Hypersensitivity Reaction   escitalopram  20 MG tablet Commonly known as: LEXAPRO  Take 1 tablet (20 mg total) by mouth daily. Start taking on: Feb 02, 2024 What changed:  medication strength how much to take  Indication: Major Depressive Disorder   ibuprofen  200 MG tablet Commonly known as: ADVIL  Take 400 mg by mouth every 6 (six) hours as needed for mild pain (pain score 1-3).  Indication: Pain   metFORMIN  500 MG tablet Commonly known as: GLUCOPHAGE  TAKE 1 TABLET BY MOUTH 2 TIMES DAILY WITH A MEAL.  Indication: Body Weight Gain due to Antipsychotic Medication Use   naltrexone  50 MG tablet Commonly known as: DEPADE Take 1 tablet (50 mg total) by mouth daily.  Indication: mood stabilization   omeprazole  40 MG capsule Commonly known as: PRILOSEC Take 1 capsule (40 mg total) by mouth in the morning and at bedtime. What changed: when to take this  Indication: Gastroesophageal Reflux Disease   Pataday  0.1 % ophthalmic solution Generic drug:  olopatadine  Place 1 drop into both eyes 3 (three) times daily as needed for allergies.  Indication: Inflammation of Eyelid Lining due to Allergy    propranolol  10 MG tablet Commonly known as: INDERAL  Take 1 tablet (10 mg total) by mouth at bedtime.  Indication: Feeling Anxious   Testosterone  Cypionate 200 MG/ML Soln Inject weekly under the skin as directed What changed:  how to take this when to take this additional instructions  Indication: Transgender Man   traZODone  50 MG tablet Commonly known as: DESYREL  Take 1-2 tablets (50-100 mg total) by  mouth at bedtime as needed for sleep.  Indication: Trouble Sleeping   triamcinolone  ointment 0.1 % Commonly known as: KENALOG  APPLY TOPICALLY 2 (TWO) TIMES DAILY AS NEEDED (ECZEMA).  Indication: Skin Inflammation        Follow-up Information     [facility] Follow up on 02/15/2024.   Specialty: Behavioral Health Why: You have an appointment on 02/15/24 at 10:30 am for medication management services. Contact information: [address] [place]  [address] [phone]        Counseling, Estel Heir. Go on 02/02/2024.   Why: You have an appointment with your therapist, Earla Glassman on Thursday, 5/22 at [facility] information: [address] [place] [address] [phone]                 Follow-up recommendations/comments:   Activity: as tolerated  Diet: heart healthy  Other: -Follow-up with your outpatient psychiatric provider -instructions on appointment date, time, and address (location) are provided to you in discharge paperwork.  -Take your psychiatric medications as prescribed at discharge - instructions are provided to you in the discharge paperwork  -Follow-up with outpatient primary care doctor and other specialists -for management of chronic medical disease, including: Hyperlipidemia, hypertriglyceridemia, obstructive sleep  apnea.  -Testing: Follow-up with outpatient provider for abnormal lab results: LDL 153, cholesterol 212.  -Recommend abstinence from alcohol, tobacco, and other illicit drug use at discharge.   -If your psychiatric symptoms recur, worsen, or if you have side effects to your psychiatric medications, call your outpatient psychiatric provider, 911, [phone] or go to the nearest emergency department.  -If suicidal thoughts recur, call your outpatient psychiatric provider, 911, [phone] or go to the nearest emergency department.  Signed: Haakon Titsworth, MD 02/01/2024, 11:53 AM

## 2024-02-01 NOTE — [facility] Suicide Risk Assessment (Signed)
 Suicide Risk Assessment  Discharge Assessment    [facility] Discharge Suicide Risk Assessment   Principal Problem: Suicidal ideations Discharge Diagnoses: Principal Problem:   Suicidal ideations Active Problems:   Deliberate self-cutting   Total Time spent with patient: 2.5 hours  Antonio [name] is a 18 y.o. adult  with a past psychiatric history of ASD level 1, social anxiety disorder, major depressive disorder with multiple psychiatric hospitalizations in the context of suicide by cutting/OD/swallowing blade, borderline personality traits as well as self-mutilating behaviors. Patient initially arrived to [facility] on 5/17 for suspected suicide attempt with left wrist laceration with pocketknife on the a.m. of 5/17 -- on further investigation, likely nonsuicidal self-injury secondary to mood lability then an attempt to end his life.  He was then evaluated by telepsychiatry team in the emergency department, who recommended inpatient hospitalization as patient had poor insight as to why he cut arm with pocketknife. Admitted to [facility] on 5/17 for acute safety concerns, impaired functioning, and severe substance-induced psychosis or mood disturbances.   During the patient's hospitalization, patient had extensive initial psychiatric evaluation, and follow-up psychiatric evaluations every day.   Psychiatric diagnoses provided upon initial assessment:    Major depressive disorder, recurrent, severe, without psychotic features, in current episode Generalized anxiety disorder Social anxiety disorder Autism spectrum disorder (level 1) assistance Borderline personality traits Nonsuicidal self-injury   Patient's psychiatric medications were adjusted on admission:    - Continue home Abilify  15 mg daily - Increase home Lexapro  10 mg daily ? 20 mg daily for depressed mood. - Continue home buspirone  10 mg twice daily - Continue home propranolol  10 mg nightly - Continue home metformin  500 mg every  morning with breakfast - Continue home naltrexone  50 mg daily - Repeat EKG - Labs: A.m. A1c, lipid panel, TSH   During the hospitalization, other adjustments were made to the patient's psychiatric medication regimen:    - Continue home Abilify  15 mg daily - Continue Lexapro  20 mg daily for depressed mood. - Continue home buspirone  10 mg twice daily - Continue home propranolol  10 mg nightly - Continue home metformin  500 mg every morning with breakfast - Continue home naltrexone  50 mg daily   Patient's care was discussed during the interdisciplinary team meeting every day during the hospitalization.   The patient denied having side effects to prescribed psychiatric medication.   Gradually, patient started adjusting to milieu. The patient was evaluated each day by a clinical provider to ascertain response to treatment. Improvement was noted by the patient's report of decreasing symptoms, improved sleep and appetite, affect, medication tolerance, behavior, and participation in unit programming.  Patient was asked each day to complete a self inventory noting mood, mental status, pain, new symptoms, anxiety and concerns.   Symptoms were reported as significantly decreased or resolved completely by discharge.  The patient reports that their mood is stable.  The patient denied having suicidal thoughts for more than 48 hours prior to discharge.  Patient denies having homicidal thoughts.  Patient denies having auditory hallucinations.  Patient denies any visual hallucinations or other symptoms of psychosis.  The patient was motivated to continue taking medication with a goal of continued improvement in mental health.    The patient reports their target psychiatric symptoms of mood lability responded well to the psychiatric medications, and the patient reports overall benefit other psychiatric hospitalization. Supportive psychotherapy was provided to the patient. The patient also participated in regular  group therapy while hospitalized. Coping skills, problem solving as well  as relaxation therapies were also part of the unit programming.   Labs were reviewed with the patient, and abnormal results were discussed with the patient.   The patient is able to verbalize their individual safety plan to this provider.   # It is recommended to the patient to continue psychiatric medications as prescribed, after discharge from the hospital.     # It is recommended to the patient to follow up with your outpatient psychiatric provider and PCP.   # It was discussed with the patient, the impact of alcohol, drugs, tobacco have been there overall psychiatric and medical wellbeing, and total abstinence from substance use was recommended the patient.ed.   # Prescriptions provided or sent directly to preferred pharmacy at discharge. Patient agreeable to plan. Given opportunity to ask questions. Appears to feel comfortable with discharge.    # In the event of worsening symptoms, the patient is instructed to call the crisis hotline, 911 and or go to the nearest ED for appropriate evaluation and treatment of symptoms. To follow-up with primary care provider for other medical issues, concerns and or health care needs   # Patient was discharged home with a plan to follow up as noted below.   Musculoskeletal: Strength & Muscle Tone: within normal limits Gait & Station: normal Patient leans: N/A  Psychiatric Specialty Exam:   Presentation  General Appearance:  Appropriate for Environment; Casual   Eye Contact: Good   Speech: Clear and Coherent   Speech Volume: Normal   Handedness: Right     Mood and Affect  Mood: Euthymic   Affect: Appropriate     Thought Process  Thought Processes: Linear   Descriptions of Associations:Intact   Orientation:Full (Time, Place and Person)   Thought Content:Logical   History of Schizophrenia/Schizoaffective disorder:No   Duration of Psychotic  Symptoms:N/A (none noted)   Hallucinations:Hallucinations: None   Ideas of Reference:None   Suicidal Thoughts:Suicidal Thoughts: No   Homicidal Thoughts:Homicidal Thoughts: No     Sensorium  Memory: Immediate Fair   Judgment: Fair   Insight: Fair     Art therapist  Concentration: Fair   Attention Span: Fair   Recall: [employer] of Knowledge: Fair   Language: Fair     Psychomotor Activity  Psychomotor Activity: Psychomotor Activity: Normal     Assets  Assets: Communication Skills; Financial Resources/Insurance; Housing; Resilience; Social Support     Sleep  Sleep: Sleep: Good Number of Hours of Sleep: 7.25       Physical Exam: Physical Exam ROS Blood pressure 123/89, pulse 95, temperature 98.7 F (37.1 C), temperature source Oral, resp. rate 14, height 5\' 5"  (1.651 m), weight 99.8 kg, SpO2 99%. Body mass index is 36.61 kg/m.  Mental Status Per Nursing Assessment::   On Admission:  Self-harm thoughts, Self-harm behaviors  Demographic factors:  Male Current Mental Status:  Self-harm thoughts, Self-harm behaviors Loss Factors:  Financial problems / change in socioeconomic status Historical Factors:  Impulsivity Risk Reduction Factors:  Living with another person, especially a relative   Continued Clinical Symptoms:  Depression:   Recent sense of peace/wellbeing Personality Disorders:   Cluster B Comorbid depression More than one psychiatric diagnosis Previous Psychiatric Diagnoses and Treatments  Cognitive Features That Contribute To Risk:  None    Suicide Risk:  Mild: There are no identifiable suicide plans, no associated intent, mild dysphoria and related symptoms, few other risk factors, and identifiable protective factors, including available and accessible social support.     Follow-up  Information     [facility] Follow up on 02/15/2024.   Specialty: Behavioral Health Why: You  have an appointment on 02/15/24 at 10:30 am for medication management services. Contact information: [address] [place]  [address] [phone]        Counseling, Estel Heir. Go on 02/02/2024.   Why: You have an appointment with your therapist, Earla Glassman on Thursday, 5/22 at [facility] information: [address] [place] [address] [phone]                 Plan Of Care/Follow-up recommendations:   Activity: as tolerated   Diet: heart healthy   Other: -Follow-up with your outpatient psychiatric provider -instructions on appointment date, time, and address (location) are provided to you in discharge paperwork.   -Take your psychiatric medications as prescribed at discharge - instructions are provided to you in the discharge paperwork   -Follow-up with outpatient primary care doctor and other specialists -for management of chronic medical disease, including: Hyperlipidemia, hypertriglyceridemia, obstructive sleep apnea.   -Testing: Follow-up with outpatient provider for abnormal lab results: LDL 153, cholesterol 212.   -Recommend abstinence from alcohol, tobacco, and other illicit drug use at discharge.    -If your psychiatric symptoms recur, worsen, or if you have side effects to your psychiatric medications, call your outpatient psychiatric provider, 911, [phone] or go to the nearest emergency department.   -If suicidal thoughts recur, call your outpatient psychiatric provider, 911, [phone] or go to the nearest emergency department.  Eligio Angert, MD 02/01/2024, 11:56 AM

## 2024-02-01 NOTE — [facility] Group Notes (Signed)
 [facility] Group Notes:  (Nursing/MHT/Case Management/Adjunct)  Date:  02/01/2024  Time:  3:27 AM  Type of Therapy:  Wrap-up group  Participation Level:  Active  Participation Quality:  Appropriate  Affect:  Appropriate  Cognitive:  Appropriate  Insight:  Appropriate  Engagement in Group:  Engaged  Modes of Intervention:  Education  Summary of Progress/Problems: Goal to talk with mom. Met goal. Rated day 8/10.  Alvaro Augusta 02/01/2024, 3:27 AM

## 2024-02-01 NOTE — Progress Notes (Signed)
 Patient discharged from [facility] on 02/01/24 at 4:30PM. Patient denies SI, plan, and intention. Suicide safety plan completed, reviewed with this RN, given to the patient, and a copy in the chart. Patient denies HI/AVH upon discharge. Patient is alert, oriented, and cooperative. RN provided patient with discharge paperwork and reviewed information with patient. Patient expressed that he understood all of the discharge instructions. Pt was satisfied with belongings returned to him from the locker and at bedside. Discharged patient to [facility] waiting room.

## 2024-02-08 NOTE — Progress Notes (Signed)
 HPI Antonio [name] followed for OSA, complicated by F2M Transgender, Autism, Depression, Hx Suicide Attempt/ Self Harm, Allergic Rhinitis, Asthma, Food Allergy  GERD Followed by Allergy  and Behavioral Health. HST 01/02/24- (SNAP)- AHI 11.3/hr, desat to 84%, body weight 235 lbs  =============================================================================================  08/29/23- [name] courtesy of Dr Antonio [name] with concern of hypersomnolence, complicated by F2M Transgender, Autism, Depression, Hx Suicide Attempt/ Self Harm, Allergic Rhinitis, Asthma, Food Allergy  GERD Followed by Allergy  and Behavioral Health. -Trazodone  50-100 HS,  also Abilify , Naltrexone  Body weight today-     Discussed the use of AI scribe software for clinical note transcription with the patient, who gave verbal consent to proceed.  History of Present Illness   The patient, with a history of sleep apnea in early childhood, presents with chronic insomnia and excessive daytime sleepiness. He reports difficulty staying awake during the day, often necessitating naps, but denies falling asleep during activities such as driving. Despite this, he notes that naps do not provide lasting relief from his fatigue. At night, he experiences frequent awakenings, approximately every 30 minutes to two hours, but these episodes are brief, lasting only about a minute. He also reports occasional nightmares, occurring a few times a month. Past attempts to manage his sleep issues with melatonin and trazodone  have been unsuccessful; while these medications helped him fall asleep initially, they did not prevent the frequent nighttime awakenings. The patient denies experiencing sleep paralysis, cataplexy, or hypnagogic hallucinations. He also denies any significant snoring or observed apneas during sleep. He has a history of tonsillectomy and is currently on testosterone  therapy, which he believes may have contributed to recent weight gain.      Assessment and  Plan:    Insomnia   Longstanding difficulty with sleep initiation and maintenance. No improvement with melatonin or trazodone . Reports frequent nocturnal awakenings and daytime sleepiness. No history of sleepwalking or sleep-related eating disorder. No cataplexy or hypnagogic hallucinations. History of sleep apnea in early childhood.   -Schedule nocturnal polysomnogram to evaluate for sleep apnea and other sleep disorders.   -Follow-up appointment in approximately three months, or sooner depending on the results of the sleep study.    Weight Gain   Significant 60 lb weight gain over the past two years. Patient is on testosterone , which may contribute to weight gain. Patient reports lack of energy to exercise.   -Encourage patient to monitor weight and discuss any concerns or changes with his primary care provider.     02/09/24-[name] courtesy of Dr Antonio [name] with concern of hypersomnolence, complicated by F2M Transgender, Autism, Depression, Hx Suicide Attempt/ Self Harm, Allergic Rhinitis, Asthma, Food Allergy  GERD Followed by Allergy  and Behavioral Health. HST 01/02/24- (SNAP)- AHI 11.3/hr, desat to 84%, body weight 235 lbs                          Mother here today -Trazodone  50-100 HS,  also Abilify , Naltrexone  Body weight today-223 lbs For treatment decision [facility] dc 5/21. Discussed the use of AI scribe software for clinical note transcription with the patient, who gave verbal consent to proceed.  History of Present Illness   Antonio [name] is a 18 year old male with sleep apnea who presents with difficulty falling and staying asleep. He is accompanied by his mother.  He experiences ongoing difficulty with falling and staying asleep despite using trazodone , which he feels is insufficient. He has daytime fatigue and does not feel rested after sleep. A  recent sleep study confirms mild sleep apnea with approximately eleven apneic episodes per hour. Weight gain and sleeping position  are noted to exacerbate his condition. We discussed  treatments for OSA, noting that his meds and other factors may contribute to sleep and fatigue issues. We can see if treating OSA makes a useful difference.     Assessment and Plan:    Obstructive Sleep Apnea, Mild Mild obstructive sleep apnea with 11 apneic events per hour due to mechanical airway obstruction. Discussed CPAP and oral appliances as treatment options. - Refer to Dr. Kalman [name] for oral appliance evaluation. - Order CPAP setup through home care company. - Informed him about costs and logistics of CPAP and oral appliances. - Advised monitoring for contact from referral sources and follow up if not contacted within two weeks. - Discussed importance of weight management and sleeping position.          ROS-see HPI   + = positive Constitutional:    +weight gain, night sweats, fevers, chills, fatigue, lassitude. HEENT:    +headaches, difficulty swallowing, tooth/dental problems, sore throat,       sneezing, itching, ear ache, nasal congestion, post nasal drip, snoring CV:    chest pain, orthopnea, PND, swelling in lower extremities, anasarca,                                   dizziness, palpitations Resp:   +shortness of breath with exertion or at rest.                productive cough,   non-productive cough, coughing up of blood.              change in color of mucus.  wheezing.   Skin:    rash or lesions. GI: +heartburn, indigestion, +abdominal pain, nausea, vomiting, diarrhea,                 change in bowel habits, loss of appetite GU: dysuria, change in color of urine, no urgency or frequency.   flank pain. MS:   +joint pain, stiffness, decreased range of motion, back pain. Neuro-     nothing unusual Psych:  change in mood or affect.  +depression or anxiety.   memory loss.  OBJ- Physical Exam General- Alert, Oriented, Affect-appropriate, Distress- none acute, +obese Skin- rash-none, lesions- none, excoriation- none,  + piercings Lymphadenopathy- none Head- atraumatic            Eyes- Gross vision intact, PERRLA, conjunctivae and secretions clear            Ears- Hearing, canals-normal            Nose- Clear, no-Septal dev, mucus, polyps, erosion, perforation             Throat- Mallampati IV , mucosa clear , drainage- none, tonsils- atrophic, +[id number] Neck- flexible , trachea midline, no stridor , thyroid  nl, carotid no bruit Chest - symmetrical excursion , unlabored           Heart/CV- RRR , no murmur , no gallop  , no rub, nl s1 s2                           - JVD- none , edema- none, stasis changes- none, varices- none           Lung- clear to P&A, wheeze- none, cough- none ,  dullness-none, rub- none           Chest wall-  Abd-  Br/ Gen/ Rectal- Not done, not indicated Extrem- cyanosis- none, clubbing, none, atrophy- none, strength- nl Neuro- grossly intact to observation

## 2024-02-09 ENCOUNTER — Encounter: Payer: Self-pay | Admitting: Internal Medicine

## 2024-02-09 ENCOUNTER — Ambulatory Visit: Admitting: Internal Medicine

## 2024-02-09 VITALS — BP 112/78 | HR 79 | Temp 97.9°F | Ht 65.0 in | Wt 223.6 lb

## 2024-02-09 DIAGNOSIS — G4733 Obstructive sleep apnea (adult) (pediatric): Secondary | ICD-10-CM

## 2024-02-09 DIAGNOSIS — G4710 Hypersomnia, unspecified: Secondary | ICD-10-CM | POA: Diagnosis not present

## 2024-02-09 NOTE — Patient Instructions (Signed)
 Order- referral to Orthodontist Dr Kalman Ores, DDS   consider oral appliance for OSA  Order- new DME, new CPAP auto 5-15, mask of choice, humidifier, supplies, Airview/ card  You can meet with each of these providers and discuss what you want to do.  We are leaving management of any sleep med, like Trazodone , to your Behavioral Health folks.

## 2024-02-15 ENCOUNTER — Telehealth (INDEPENDENT_AMBULATORY_CARE_PROVIDER_SITE_OTHER): Admitting: Child and Adolescent Psychiatry

## 2024-02-15 DIAGNOSIS — F4010 Social phobia, unspecified: Secondary | ICD-10-CM

## 2024-02-15 DIAGNOSIS — F840 Autistic disorder: Secondary | ICD-10-CM

## 2024-02-15 DIAGNOSIS — F3341 Major depressive disorder, recurrent, in partial remission: Secondary | ICD-10-CM | POA: Diagnosis not present

## 2024-02-15 DIAGNOSIS — F649 Gender identity disorder, unspecified: Secondary | ICD-10-CM

## 2024-02-15 MED ORDER — TRAZODONE HCL 50 MG PO TABS: 50.0000 mg | ORAL_TABLET | Freq: Every evening | ORAL | 0 refills | Status: DC | PRN

## 2024-02-15 MED ORDER — PROPRANOLOL HCL 10 MG PO TABS: 10.0000 mg | ORAL_TABLET | Freq: Every day | ORAL | 0 refills | Status: DC

## 2024-02-15 MED ORDER — ESCITALOPRAM OXALATE 20 MG PO TABS: 20.0000 mg | ORAL_TABLET | Freq: Every day | ORAL | 0 refills | Status: DC

## 2024-02-15 MED ORDER — BUSPIRONE HCL 10 MG PO TABS: 10.0000 mg | ORAL_TABLET | Freq: Two times a day (BID) | ORAL | 1 refills | Status: DC

## 2024-02-15 MED ORDER — ARIPIPRAZOLE 15 MG PO TABS: 15.0000 mg | ORAL_TABLET | Freq: Every day | ORAL | 0 refills | Status: DC
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
## 2024-02-29 ENCOUNTER — Encounter: Payer: Self-pay | Admitting: Internal Medicine

## 2024-03-14 ENCOUNTER — Ambulatory Visit (INDEPENDENT_AMBULATORY_CARE_PROVIDER_SITE_OTHER): Payer: Self-pay | Admitting: Psychiatry

## 2024-03-14 ENCOUNTER — Encounter: Payer: Self-pay | Admitting: Psychiatry

## 2024-03-14 VITALS — BP 127/75 | HR 75 | Ht 65.0 in | Wt 204.0 lb

## 2024-03-14 DIAGNOSIS — F840 Autistic disorder: Secondary | ICD-10-CM | POA: Diagnosis not present

## 2024-03-14 DIAGNOSIS — F4010 Social phobia, unspecified: Secondary | ICD-10-CM

## 2024-03-14 DIAGNOSIS — F649 Gender identity disorder, unspecified: Secondary | ICD-10-CM | POA: Diagnosis not present

## 2024-03-14 DIAGNOSIS — F3341 Major depressive disorder, recurrent, in partial remission: Secondary | ICD-10-CM | POA: Diagnosis not present

## 2024-03-14 MED ORDER — BUSPIRONE HCL 10 MG PO TABS: 10.0000 mg | ORAL_TABLET | Freq: Two times a day (BID) | ORAL | 1 refills | Status: DC

## 2024-03-14 MED ORDER — ESCITALOPRAM OXALATE 20 MG PO TABS: 20.0000 mg | ORAL_TABLET | Freq: Every day | ORAL | 1 refills | Status: DC

## 2024-03-14 MED ORDER — ARIPIPRAZOLE 15 MG PO TABS: 15.0000 mg | ORAL_TABLET | Freq: Every day | ORAL | 1 refills | Status: DC

## 2024-03-14 MED ORDER — PROPRANOLOL HCL 10 MG PO TABS: 10.0000 mg | ORAL_TABLET | Freq: Every day | ORAL | 1 refills | Status: DC
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
## 2024-04-04 ENCOUNTER — Other Ambulatory Visit: Payer: Self-pay | Admitting: Child and Adolescent Psychiatry

## 2024-04-04 DIAGNOSIS — Z79899 Other long term (current) drug therapy: Secondary | ICD-10-CM

## 2024-04-23 ENCOUNTER — Encounter: Payer: Self-pay | Admitting: Family Medicine

## 2024-04-25 ENCOUNTER — Other Ambulatory Visit: Payer: Self-pay | Admitting: Family Medicine

## 2024-04-25 DIAGNOSIS — E88819 Insulin resistance, unspecified: Secondary | ICD-10-CM

## 2024-04-25 DIAGNOSIS — E669 Obesity, unspecified: Secondary | ICD-10-CM

## 2024-04-27 ENCOUNTER — Ambulatory Visit (INDEPENDENT_AMBULATORY_CARE_PROVIDER_SITE_OTHER): Admitting: Psychiatry

## 2024-04-27 ENCOUNTER — Encounter: Payer: Self-pay | Admitting: Psychiatry

## 2024-04-27 DIAGNOSIS — F3341 Major depressive disorder, recurrent, in partial remission: Secondary | ICD-10-CM | POA: Diagnosis not present

## 2024-04-27 DIAGNOSIS — F4010 Social phobia, unspecified: Secondary | ICD-10-CM

## 2024-04-27 DIAGNOSIS — F649 Gender identity disorder, unspecified: Secondary | ICD-10-CM | POA: Diagnosis not present

## 2024-04-27 DIAGNOSIS — F840 Autistic disorder: Secondary | ICD-10-CM

## 2024-04-27 DIAGNOSIS — Z79899 Other long term (current) drug therapy: Secondary | ICD-10-CM | POA: Diagnosis not present

## 2024-04-27 MED ORDER — NALTREXONE HCL 50 MG PO TABS: 50.0000 mg | ORAL_TABLET | Freq: Every day | ORAL | 1 refills | Status: AC

## 2024-04-27 MED ORDER — METFORMIN HCL 500 MG PO TABS: 500.0000 mg | ORAL_TABLET | Freq: Two times a day (BID) | ORAL | 1 refills | Status: DC

## 2024-04-27 NOTE — Progress Notes (Signed)
 [facility] [address], [place]   Follow-up visit  Date of Service: 04/27/2024  CC/Purpose: Routine medication management follow up.    Antonio [name] is a 18 y.o. adult with a past psychiatric history of depression, anxiety, ASD who presents today for a psychiatric follow up appointment.    The patient was last seen on 03/14/24, at which time the following plan was established:  Medication management:             - Continue Abilify  15mg  daily             - Continue Lexapro  20mg  daily             - Continue propranolol  10mg  at bedtime             - Continue Buspar  10mg  BID             - Continue trazodone              - Continue Metformin  500mg  BID _______________________________________________________________________________________ Acute events/encounters since last visit: denies    Antonio [name] presents to clinic with his mother. They were interviewed together and Antonio [name] alone. They both note that Antonio [name] has been feeling a bit more down lately. His therapist told him she thinks he is in a depressive episode. He often feels down and sad. He reports low motivation to do things, and a lower appetite. He reports some recent weight loss. He is planning on moving his Spravato treatments back to weekly again. Discussed his medicine - he would like to stay on these doses for now. He doesn't want to change things. He rpeorts some passive SI, but denies any intent. No SI/Hi/AVH.    Sleep: stable Appetite: Decreased Depression: see HPI Bipolar symptoms:  denies Current suicidal/homicidal ideations:  passive thoughts Current auditory/visual hallucinations:  denied    Non-Suicidal Self-Injury: habitual self harm for years Suicide Attempt History: at least 6 times - overdoses, cutting, swallowed razor blade  Psychotherapy: Grayce Pouch - [facility] Previous psychiatric medication trials:  multiple: Zoloft  150mg , Pristiq  50mg , Prozac  20mg , Wellbutrin  XL 150mg , Effexor  XR  225mg , Intuniv  2mg , Seroquel  75mg , Latuda  40mg , Lamictal  - unclear dose.      Not in work or in school at this time Current Living Situation (including members of house hold): lives with mom and step dad - sees bio dad some     Allergies  Allergen Reactions   Egg-Derived Products Anaphylaxis   Justicia Adhatoda Anaphylaxis and Other (See Comments)    All TREE NUTS   Peanut-Containing Drug Products Anaphylaxis      Labs:  reviewed  Medical diagnoses: Patient Active Problem List   Diagnosis Date Noted   Suicide attempt (HCC) 11/14/2023   Obesity (BMI 30-39.9) 09/01/2023   Borderline personality disorder (HCC) 08/19/2023   Recurrent major depressive disorder (HCC) 08/19/2023   Daytime somnolence 05/26/2023   Transgender man on hormone therapy 05/26/2023   Suicidal ideations 02/09/2023   MDD (major depressive disorder), recurrent severe, without psychosis (HCC) 01/02/2023   Suicide attempt by cutting of wrist (HCC) 07/28/2022   Autism spectrum disorder requiring support (level 1) 05/01/2021   Acne vulgaris 07/31/2020   Gastroesophageal reflux disease 09/27/2019   Insulin  resistance 10/17/2018   Deliberate self-cutting 10/17/2018   Social anxiety disorder 10/17/2018   Gender dysphoria    Intrinsic atopic dermatitis 06/06/2015   Asthma 08/23/2011    Psychiatric Specialty Exam: There were no vitals taken for this visit.There is no height or weight  on file to calculate BMI.  General Appearance: Neat and Well Groomed  Eye Contact:  Good  Speech:  Clear and Coherent  Mood:  Dysphoric  Affect:  Appropriate  Thought Process:  Goal Directed  Orientation:  Full (Time, Place, and Person)  Thought Content:  Logical  Suicidal Thoughts:  No  Homicidal Thoughts:  No  Memory:  Immediate;   Good  Judgement:  Fair  Insight:  Fair  Psychomotor Activity:  Normal  Concentration:  Concentration: Good  Recall:  Good  Fund of Knowledge:  Good  Language:  Good  Assets:   Communication Skills Desire for Improvement Financial Resources/Insurance Housing Leisure Time Physical Health Social Support Talents/Skills Transportation  Cognition:  WNL      Assessment   Psychiatric Diagnoses:   ICD-10-CM   1. Autism spectrum disorder requiring support (level 1)  F84.0     2. Other long term (current) drug therapy  Z79.899 metFORMIN  (GLUCOPHAGE ) 500 MG tablet    3. Recurrent major depressive disorder, in partial remission (HCC)  F33.41     4. Gender dysphoria  F64.9     5. Social anxiety disorder  F40.10       Patient complexity: Moderate   Patient Education and Counseling:  Supportive therapy provided for identified psychosocial stressors.  Medication education provided and decisions regarding medication regimen discussed with patient/guardian.   On assessment today, Antonio [name] has been dealing with a more depressive mood lately. He has been doing Spravato still, and they plan on increasing the frequency of this. He would like to stay on these medicines for now with no changes. He denies any intent to end his life, but does report some passive SI, which is his baseline. No AVH/HI.    Plan  Medication management:             - Continue Abilify  15mg  daily             - Continue Lexapro  20mg  daily             - Continue propranolol  10mg  at bedtime             - Continue Buspar  10mg  BID             - Continue trazodone              - Continue Metformin  500mg  BID  - Naltrexone  50mg  daily  Labs/Studies:  - reviewed  Additional recommendations:  - Continue with current therapist, Crisis plan reviewed and patient verbally contracts for safety. Go to ED with emergent symptoms or safety concerns, and Risks, benefits, side effects of medications, including any / all black box warnings, discussed with patient, who verbalizes their understanding   Follow Up: Return in 1 month - Call in the interim for any side-effects, decompensation, questions, or problems  between now and the next visit.   I have spent 30 minutes reviewing the patients chart, meeting with the patient and family, and reviewing medicines and side effects.   Selinda GORMAN Lauth, MD [facility]

## 2024-04-29 ENCOUNTER — Encounter (HOSPITAL_COMMUNITY): Payer: Self-pay | Admitting: Adult Health

## 2024-04-29 ENCOUNTER — Emergency Department (EMERGENCY_DEPARTMENT_HOSPITAL)
Admission: EM | Admit: 2024-04-29 | Discharge: 2024-04-29 | Disposition: A | Discharge status: Other Acute Inpatient Hospital | Source: Home / Self Care | Attending: Emergency Medicine | Admitting: Emergency Medicine

## 2024-04-29 ENCOUNTER — Inpatient Hospital Stay (HOSPITAL_COMMUNITY)
Admission: AD | Admit: 2024-04-29 | Discharge: 2024-05-02 | DRG: 885 | Disposition: A | Discharge status: Home / Self Care | Source: Intra-hospital

## 2024-04-29 ENCOUNTER — Other Ambulatory Visit: Payer: Self-pay

## 2024-04-29 DIAGNOSIS — S61512A Laceration without foreign body of left wrist, initial encounter: Secondary | ICD-10-CM | POA: Insufficient documentation

## 2024-04-29 DIAGNOSIS — R4587 Impulsiveness: Secondary | ICD-10-CM | POA: Diagnosis present

## 2024-04-29 DIAGNOSIS — W458XXA Other foreign body or object entering through skin, initial encounter: Secondary | ICD-10-CM | POA: Diagnosis present

## 2024-04-29 DIAGNOSIS — F603 Borderline personality disorder: Secondary | ICD-10-CM | POA: Diagnosis present

## 2024-04-29 DIAGNOSIS — Z91010 Allergy to peanuts: Secondary | ICD-10-CM | POA: Insufficient documentation

## 2024-04-29 DIAGNOSIS — F4010 Social phobia, unspecified: Secondary | ICD-10-CM | POA: Diagnosis present

## 2024-04-29 DIAGNOSIS — Z8249 Family history of ischemic heart disease and other diseases of the circulatory system: Secondary | ICD-10-CM | POA: Diagnosis not present

## 2024-04-29 DIAGNOSIS — Z91048 Other nonmedicinal substance allergy status: Secondary | ICD-10-CM | POA: Diagnosis not present

## 2024-04-29 DIAGNOSIS — K219 Gastro-esophageal reflux disease without esophagitis: Secondary | ICD-10-CM | POA: Diagnosis present

## 2024-04-29 DIAGNOSIS — Z7984 Long term (current) use of oral hypoglycemic drugs: Secondary | ICD-10-CM

## 2024-04-29 DIAGNOSIS — Z87891 Personal history of nicotine dependence: Secondary | ICD-10-CM

## 2024-04-29 DIAGNOSIS — Z9152 Personal history of nonsuicidal self-harm: Secondary | ICD-10-CM | POA: Diagnosis not present

## 2024-04-29 DIAGNOSIS — F411 Generalized anxiety disorder: Secondary | ICD-10-CM | POA: Diagnosis present

## 2024-04-29 DIAGNOSIS — R45851 Suicidal ideations: Secondary | ICD-10-CM | POA: Diagnosis present

## 2024-04-29 DIAGNOSIS — Z7951 Long term (current) use of inhaled steroids: Secondary | ICD-10-CM | POA: Insufficient documentation

## 2024-04-29 DIAGNOSIS — Z91012 Allergy to eggs: Secondary | ICD-10-CM | POA: Diagnosis not present

## 2024-04-29 DIAGNOSIS — J45909 Unspecified asthma, uncomplicated: Secondary | ICD-10-CM | POA: Diagnosis present

## 2024-04-29 DIAGNOSIS — Z9151 Personal history of suicidal behavior: Secondary | ICD-10-CM | POA: Diagnosis not present

## 2024-04-29 DIAGNOSIS — Z818 Family history of other mental and behavioral disorders: Secondary | ICD-10-CM

## 2024-04-29 DIAGNOSIS — Z79899 Other long term (current) drug therapy: Secondary | ICD-10-CM | POA: Diagnosis not present

## 2024-04-29 DIAGNOSIS — F640 Transsexualism: Secondary | ICD-10-CM | POA: Diagnosis present

## 2024-04-29 DIAGNOSIS — F332 Major depressive disorder, recurrent severe without psychotic features: Secondary | ICD-10-CM | POA: Diagnosis present

## 2024-04-29 DIAGNOSIS — Y900 Blood alcohol level of less than 20 mg/100 ml: Secondary | ICD-10-CM | POA: Insufficient documentation

## 2024-04-29 DIAGNOSIS — X788XXA Intentional self-harm by other sharp object, initial encounter: Secondary | ICD-10-CM | POA: Insufficient documentation

## 2024-04-29 DIAGNOSIS — F840 Autistic disorder: Secondary | ICD-10-CM | POA: Diagnosis present

## 2024-04-29 DIAGNOSIS — Z91018 Allergy to other foods: Secondary | ICD-10-CM | POA: Diagnosis not present

## 2024-04-29 DIAGNOSIS — F419 Anxiety disorder, unspecified: Secondary | ICD-10-CM | POA: Insufficient documentation

## 2024-04-29 DIAGNOSIS — Z604 Social exclusion and rejection: Secondary | ICD-10-CM | POA: Diagnosis present

## 2024-04-29 DIAGNOSIS — F649 Gender identity disorder, unspecified: Secondary | ICD-10-CM | POA: Diagnosis present

## 2024-04-29 DIAGNOSIS — T148XXA Other injury of unspecified body region, initial encounter: Secondary | ICD-10-CM

## 2024-04-29 LAB — CBC
HCT: 42.2 % (ref 39.0–52.0)
Hemoglobin: 13.7 g/dL (ref 13.0–17.0)
MCH: 25.9 pg — ABNORMAL LOW (ref 26.0–34.0)
MCHC: 32.5 g/dL (ref 30.0–36.0)
MCV: 79.8 fL — ABNORMAL LOW (ref 80.0–100.0)
Platelets: 272 K/uL (ref 150–400)
RBC: 5.29 MIL/uL (ref 4.22–5.81)
RDW: 14 % (ref 11.5–15.5)
WBC: 5.6 K/uL (ref 4.0–10.5)
nRBC: 0 % (ref 0.0–0.2)

## 2024-04-29 LAB — COMPREHENSIVE METABOLIC PANEL WITH GFR
ALT: 14 U/L (ref 0–44)
AST: 15 U/L (ref 15–41)
Albumin: 4 g/dL (ref 3.5–5.0)
Alkaline Phosphatase: 66 U/L (ref 38–126)
Anion gap: 6 (ref 5–15)
BUN: 6 mg/dL (ref 6–20)
CO2: 24 mmol/L (ref 22–32)
Calcium: 9.3 mg/dL (ref 8.9–10.3)
Chloride: 105 mmol/L (ref 98–111)
Creatinine, Ser: 1.03 mg/dL (ref 0.61–1.24)
GFR, Estimated: >60 mL/min (ref >60)
Glucose, Bld: 94 mg/dL (ref 70–99)
Potassium: 3.8 mmol/L (ref 3.5–5.1)
Sodium: 135 mmol/L (ref 135–145)
Total Bilirubin: 0.8 mg/dL (ref 0.0–1.2)
Total Protein: 7.8 g/dL (ref 6.5–8.1)

## 2024-04-29 LAB — RAPID URINE DRUG SCREEN, HOSP PERFORMED
Amphetamines: NOT DETECTED
Barbiturates: NOT DETECTED
Benzodiazepines: NOT DETECTED
Cocaine: NOT DETECTED
Opiates: NOT DETECTED
Tetrahydrocannabinol: NOT DETECTED

## 2024-04-29 LAB — HCG, SERUM, QUALITATIVE: Preg, Serum: NEGATIVE

## 2024-04-29 LAB — ETHANOL: Alcohol, Ethyl (B): <15 mg/dL (ref <15)

## 2024-04-29 MED ORDER — METFORMIN HCL 500 MG PO TABS
500.0000 mg | ORAL_TABLET | Freq: Two times a day (BID) | ORAL | Status: DC
  Administered 2024-04-29 – 2024-04-30 (×2): 500 mg via ORAL
  Filled 2024-04-29 (×2): qty 1

## 2024-04-29 MED ORDER — PANTOPRAZOLE SODIUM 40 MG PO TBEC
80.0000 mg | DELAYED_RELEASE_TABLET | Freq: Every day | ORAL | Status: DC
  Administered 2024-04-29: 80 mg via ORAL
  Filled 2024-04-29: qty 2

## 2024-04-29 MED ORDER — ALBUTEROL SULFATE HFA 108 (90 BASE) MCG/ACT IN AERS: 1.0000 | INHALATION_SPRAY | RESPIRATORY_TRACT | Status: DC | PRN

## 2024-04-29 MED ORDER — LORAZEPAM 2 MG/ML IJ SOLN: 2.0000 mg | Freq: Three times a day (TID) | INTRAMUSCULAR | Status: DC | PRN

## 2024-04-29 MED ORDER — ALUM & MAG HYDROXIDE-SIMETH 200-200-20 MG/5ML PO SUSP: 30.0000 mL | ORAL | Status: DC | PRN

## 2024-04-29 MED ORDER — OLOPATADINE HCL 0.1 % OP SOLN: 1.0000 [drp] | Freq: Three times a day (TID) | OPHTHALMIC | Status: DC | PRN

## 2024-04-29 MED ORDER — DIPHENHYDRAMINE HCL 50 MG/ML IJ SOLN: 50.0000 mg | Freq: Three times a day (TID) | INTRAMUSCULAR | Status: DC | PRN

## 2024-04-29 MED ORDER — HALOPERIDOL LACTATE 5 MG/ML IJ SOLN: 5.0000 mg | Freq: Three times a day (TID) | INTRAMUSCULAR | Status: DC | PRN

## 2024-04-29 MED ORDER — BUSPIRONE HCL 10 MG PO TABS
10.0000 mg | ORAL_TABLET | Freq: Two times a day (BID) | ORAL | Status: DC
  Administered 2024-04-29 – 2024-05-02 (×6): 10 mg via ORAL
  Filled 2024-04-29 (×6): qty 1

## 2024-04-29 MED ORDER — IBUPROFEN 400 MG PO TABS
400.0000 mg | ORAL_TABLET | Freq: Four times a day (QID) | ORAL | Status: DC | PRN
  Administered 2024-05-01: 400 mg via ORAL
  Filled 2024-04-29: qty 1

## 2024-04-29 MED ORDER — ESCITALOPRAM OXALATE 10 MG PO TABS
20.0000 mg | ORAL_TABLET | Freq: Every day | ORAL | Status: DC
  Administered 2024-04-29: 20 mg via ORAL
  Filled 2024-04-29: qty 2

## 2024-04-29 MED ORDER — NALTREXONE HCL 50 MG PO TABS
50.0000 mg | ORAL_TABLET | Freq: Every day | ORAL | Status: DC
  Administered 2024-04-29: 50 mg via ORAL
  Filled 2024-04-29: qty 1

## 2024-04-29 MED ORDER — TRAZODONE HCL 50 MG PO TABS
50.0000 mg | ORAL_TABLET | Freq: Every evening | ORAL | Status: DC | PRN
  Administered 2024-04-29 – 2024-05-01 (×3): 100 mg via ORAL
  Filled 2024-04-29 (×3): qty 2

## 2024-04-29 MED ORDER — PROPRANOLOL HCL 10 MG PO TABS
10.0000 mg | ORAL_TABLET | Freq: Every day | ORAL | Status: DC
  Administered 2024-04-29: 10 mg via ORAL
  Filled 2024-04-29: qty 1

## 2024-04-29 MED ORDER — MAGNESIUM HYDROXIDE 400 MG/5ML PO SUSP: 30.0000 mL | Freq: Every day | ORAL | Status: DC | PRN

## 2024-04-29 MED ORDER — TRIAMCINOLONE ACETONIDE 0.1 % EX OINT: TOPICAL_OINTMENT | Freq: Two times a day (BID) | CUTANEOUS | Status: DC | PRN

## 2024-04-29 MED ORDER — PROPRANOLOL HCL 10 MG PO TABS
10.0000 mg | ORAL_TABLET | Freq: Every day | ORAL | Status: DC
  Filled 2024-04-29: qty 1

## 2024-04-29 MED ORDER — PANTOPRAZOLE SODIUM 40 MG PO TBEC
80.0000 mg | DELAYED_RELEASE_TABLET | Freq: Every day | ORAL | Status: DC
  Administered 2024-04-30 – 2024-05-02 (×3): 80 mg via ORAL
  Filled 2024-04-29 (×3): qty 2

## 2024-04-29 MED ORDER — HALOPERIDOL 5 MG PO TABS: 5.0000 mg | ORAL_TABLET | Freq: Three times a day (TID) | ORAL | Status: DC | PRN

## 2024-04-29 MED ORDER — ARIPIPRAZOLE 5 MG PO TABS: 15.0000 mg | ORAL_TABLET | Freq: Every day | ORAL | Status: DC

## 2024-04-29 MED ORDER — NALTREXONE HCL 50 MG PO TABS
50.0000 mg | ORAL_TABLET | Freq: Every day | ORAL | Status: DC
  Administered 2024-04-30 – 2024-05-02 (×3): 50 mg via ORAL
  Filled 2024-04-29 (×3): qty 1

## 2024-04-29 MED ORDER — ARIPIPRAZOLE 15 MG PO TABS
15.0000 mg | ORAL_TABLET | Freq: Every day | ORAL | Status: DC
  Administered 2024-04-29 – 2024-05-01 (×3): 15 mg via ORAL
  Filled 2024-04-29 (×3): qty 1

## 2024-04-29 MED ORDER — BUSPIRONE HCL 10 MG PO TABS
10.0000 mg | ORAL_TABLET | Freq: Two times a day (BID) | ORAL | Status: DC
  Administered 2024-04-29: 10 mg via ORAL
  Filled 2024-04-29: qty 1

## 2024-04-29 MED ORDER — DIPHENHYDRAMINE HCL 25 MG PO CAPS: 50.0000 mg | ORAL_CAPSULE | Freq: Three times a day (TID) | ORAL | Status: DC | PRN

## 2024-04-29 MED ORDER — HALOPERIDOL LACTATE 5 MG/ML IJ SOLN: 10.0000 mg | Freq: Three times a day (TID) | INTRAMUSCULAR | Status: DC | PRN

## 2024-04-29 MED ORDER — ESCITALOPRAM OXALATE 10 MG PO TABS
20.0000 mg | ORAL_TABLET | Freq: Every day | ORAL | Status: DC
  Administered 2024-04-30 – 2024-05-02 (×3): 20 mg via ORAL
  Filled 2024-04-29 (×3): qty 2

## 2024-04-29 MED ORDER — TRAZODONE HCL 50 MG PO TABS: 50.0000 mg | ORAL_TABLET | Freq: Every evening | ORAL | Status: DC | PRN

## 2024-04-29 MED ORDER — EPINEPHRINE 0.3 MG/0.3ML IJ SOAJ: 0.3000 mg | INTRAMUSCULAR | Status: DC | PRN

## 2024-04-29 NOTE — Progress Notes (Signed)
   04/29/24 1700  Psych Admission Type (Psych Patients Only)  Admission Status Voluntary  Psychosocial Assessment  Patient Complaints Self-harm thoughts;Depression;Self-harm behaviors  Eye Contact Fair  Facial Expression Flat  Affect Appropriate to circumstance  Speech Logical/coherent  Interaction Assertive  Motor Activity (S)  Other (Comment) (WDL)  Appearance/Hygiene In scrubs;Unremarkable  Behavior Characteristics Cooperative  Mood Depressed  Thought Process  Coherency WDL  Content WDL  Delusions None reported or observed  Perception WDL  Hallucination None reported or observed  Judgment Poor  Confusion None  Danger to Self  Current suicidal ideation?  (denies)  Agreement Not to Harm Self Yes  Description of Agreement verbal  Danger to Others  Danger to Others None reported or observed   Pt present with complaints of suicidal thoughts, depression and a suicide attempt prior to admission via cutting her wrist with a box cutter. Pt is unable to state specific stressors but states she has attempted suicide in the past approximately 3-4 months ago via the same method. Pt denies alcohol or drug use.

## 2024-04-29 NOTE — Consult Note (Signed)
 [facility] Psychiatric Consult Initial  Patient Name: .Antonio [name]  MRN: [id number]  DOB: 08-28-2006  Consult Order details:  Orders (From admission, onward)     Start     Ordered   04/29/24 0801  CONSULT TO CALL ACT TEAM       Ordering Provider: Laurice Maude BROCKS, MD  Provider:  (Not yet assigned)  Question:  Reason for Consult?  Answer:  Psych consult   04/29/24 0801             Mode of Visit: Tele-visit Virtual Statement:TELE PSYCHIATRY ATTESTATION & CONSENT As the provider for this telehealth consult, I attest that I verified the patient's identity using two separate identifiers, introduced myself to the patient, provided my credentials, disclosed my location, and performed this encounter via a HIPAA-compliant, real-time, face-to-face, two-way, interactive audio and video platform and with the full consent and agreement of the patient (or guardian as applicable.) Patient physical location: [facility] Emergency Department. Telehealth provider physical location: home office in state of [place] .   Video start time: 1100 Video end time: 1135    Psychiatry Consult Evaluation  Service Date: April 29, 2024 LOS:  LOS: 0 days  Chief Complaint I was stressed and just wanted an outlet.  Primary Psychiatric Diagnoses  Major Depressive Disorder, recurrent, severe 2.  Autism spectrum disorder requiring support (level 1) 3.  Gender Dysphoria  Assessment  Antonio [name] is a 18 y.o. adult admitted: Presented to the EDfor 04/29/2024  4:08 AM for self injurious behaviors.    He carries the psychiatric diagnoses of ASD, MDD Gender dysphoria, social anxiety disorder, suicide attempts and has a past medical history of  insulin  resistance, GERD and asthma. His CMP, CBC and UDS were WNL; he was medically cleared prior to psychiatric evaluation.    His current presentation of depressed mood, low motivation, sleep problems and self injurious behaviors is most consistent with his dx of MDD.  He meets criteria for psychiatric admission based on above.  Current outpatient psychotropic medications include aripiprazole , escitalopram , naltrexone , and buspar  and historically he has had a good response to these medications. He was compliant with medications prior to admission as evidenced by his reports.   On initial examination, patient is alert and oriented x5; he's cooperative but appears anxious. He endorses cutting his wrist to relief stress but also reports feeling more depressed for the past month. His has ruminating thoughts of self harm, is impulsive and  is unable to employ learned coping skills for safety.  We discussed the plan for admission, pt agrees.   Please see plan below for detailed recommendations.   Diagnoses:  Active Hospital problems: Principal Problem:   MDD (major depressive disorder), recurrent severe, without psychosis (HCC) Active Problems:   Gender dysphoria   Autism spectrum disorder requiring support (level 1)    Plan   ## Psychiatric Medication Recommendations:  Buspirone  10mg  po BID for anxiety Escitalopram  20mg  po daily for depression Naltrexone  50mg  po daily for impulse control Aripiprazole  15mg  po daily for mood stability  ## Medical Decision Making Capacity: Not specifically addressed in this encounter  ## Further Work-up:  -- EKG EKG or While pt on Qtc prolonging medications, please monitor & replete K+ to 4 and Mg2+ to 2 -- most recent EKG on 01/29/2024 had QtC of 379 -- Pertinent labwork reviewed earlier this admission includes: CMP, CBC, UDS  ## Disposition:-- We recommend inpatient psychiatric hospitalization when medically cleared. Patient is under voluntary admission status at this  time; please IVC if attempts to leave hospital.  ## Behavioral / Environmental: -Utilize compassion and acknowledge the patient's experiences while setting clear and realistic expectations for care.    ## Safety and Observation Level:  - Based on my  clinical evaluation, I estimate the patient to be at low risk of self harm in the current setting. - At this time, we recommend  routine. This decision is based on my review of the chart including patient's history and current presentation, interview of the patient, mental status examination, and consideration of suicide risk including evaluating suicidal ideation, plan, intent, suicidal or self-harm behaviors, risk factors, and protective factors. This judgment is based on our ability to directly address suicide risk, implement suicide prevention strategies, and develop a safety plan while the patient is in the clinical setting. Please contact our team if there is a concern that risk level has changed.  CSSR Risk Category:C-SSRS RISK CATEGORY: High Risk  Suicide Risk Assessment: Patient has following modifiable risk factors for suicide: untreated depression, current symptoms: anxiety/panic, insomnia, impulsivity, anhedonia, hopelessness, and recent psychiatric hospitalization, which we are addressing by referral for psychiatric admission. Patient has following non-modifiable or demographic risk factors for suicide: male gender, history of suicide attempt, history of self harm behavior, and psychiatric hospitalization Patient has the following protective factors against suicide: Access to outpatient mental health care and Supportive family  Thank you for this consult request. Recommendations have been communicated to the primary team.  We will sign off at this time.   Bernadette FORBES Barefoot, NP       History of Present Illness  Relevant Aspects of Hospital ED Course:  Admitted on 04/29/2024 for  Per RN Triage Note  Patient reports he attempted to harm himself tonight by cutting hs left wrist. Presents with laceration to the left wrist. He also wrote a note of how he was feeling. States he has had suicidal attempts in the past and has been hospitalized for it.   Patient Report:  Patient reports he was  brought to the hospital by his parents; states he used a box cutter to injure his wrist.  He reports stressors as his triggering event but cannot name any new concerns. At present he denies suicide attempt and states he cut his wrist as an outlet for stress.  However, he also reports feeling quite depressed, social isolation and impulsive behaviors.  He has a gauze to his left wrist and denies pain. He states he assumed he would need stitches but he did not.    In talking further with him, he reports its been 3 months since he last cut himself; He states he's worried about the political climate as it relates to him being a black male and what it means for his sexual orientation.  He states he's working to get established with [employer] for LGBTQ support.    He reports ongoing therapy -DBT and has learned coping skills, but he acknowledges difficulty using those skills when feeling overwhelmed.    He states he usually feels stressed at 3am in the morning and he does not want to wake his parents.  He states his cutting behaviors would improve if he slept longer.  He takes trazodone  which is effective but he only takes it prn d/t having side effect of nightmares.  Has tried melatonin which was not effective.  He reports he gets about 8 hours of sleep; recently started using his cpap, which has improved his sleep and he  no longer feels tired during the day.     Patient does report In the past states friends have helped and he was able to avoid cutting. In the past we have spoken with him and his parents regarding safety planning.  Patient states the sharps and firearms are locked up but If I want something I will get it.    Prior to hospital admission, patient states his mother told him she was going to remove things from his room because she could not trust him; he states he is aware she's trying to keep him safe but her statement makes it harder to trust her.    He reports being followed  by Dr. Vicenta selina Coward for OP psychiatry; his last visit was Friday, April 27, 2024.  He states he is not working but does chores around the house; states he does not go our often because most of his friends are busy at college.  He states he graduated high school in 2024 with a 3.7 GPA; He states his desire to go to college for engineering but he has not started planning yet d/t fear he will be overwhelmed and miss too many school days.   He has a hx for autism but presents very articulate and no obvious limitations.  He reports his limitations are textural and reports he's really hard on himself when he makes mistakes. States he previously lost a job in 2021 because he kept making mistakes, he kept crying and decided to quit after 3 days.  He also reports he has difficulty forming relationships.      Psych ROS:  Depression: yes Anxiety:  denies Mania (lifetime and current): denies Psychosis: (lifetime and current): denies  Collateral information:  Patient gave permission to speak with his mother   Review of Systems  Constitutional: Negative.   HENT: Negative.    Eyes: Negative.   Respiratory: Negative.    Cardiovascular: Negative.   Gastrointestinal: Negative.   Genitourinary: Negative.   Musculoskeletal: Negative.   Skin: Negative.   Neurological: Negative.   Endo/Heme/Allergies: Negative.      Psychiatric and Social History  Psychiatric History:  Information collected from patient and chart review  Prev Dx/Sx: depression, anxiety, prior suicide attempts Current Psych Provider: as listed above Home Meds (current): as listed above Previous Med Trials: as listed above Therapy: Grayce Pouch with [facility],last visit was one week ago; he reports they have a good relationship Prior Psych Hospitalization: hx for many psychiatric hospitalization  Prior Self Harm: yes,hx for NSSIB and suicide attempt Prior Violence: denies  Family Psych History: denies Family  Hx suicide: denies  Social History:  Developmental Hx: hx for autism but he's unsure if he rating Educational Hx: graduated 12th grade in 2024 with 3.7 GPA.  Occupational Hx: denies  Legal Hx: denies  Living Situation: lives this his parents Spiritual Hx: unknown Access to weapons/lethal means: denies    Substance History Alcohol: denies  Type of alcohol denies Last Drink denies Number of drinks per day denies History of alcohol withdrawal seizures denies History of DT's denies Tobacco: denies Illicit drugs: denies Prescription drug abuse: abuse Rehab hx: n/a  Exam Findings  Physical Exam:  Vital Signs:  Temp:  [98.3 F (36.8 C)-98.4 F (36.9 C)] 98.3 F (36.8 C) (08/17 1459) Pulse Rate:  [71-84] 79 (08/17 1459) Resp:  [16-18] 18 (08/17 1459) BP: (122-134)/(76-83) 134/79 (08/17 1459) SpO2:  [98 %-99 %] 98 % (08/17 1459) Weight:  [95.7 kg] 95.7 kg (08/17 0414)  Blood pressure 134/79, pulse 79, temperature 98.3 F (36.8 C), temperature source Oral, resp. rate 18, height 5' 5 (1.651 m), weight 95.7 kg, SpO2 98%. Body mass index is 35.11 kg/m.  Physical Exam Cardiovascular:     Rate and Rhythm: Normal rate.     Pulses: Normal pulses.  Musculoskeletal:        General: Normal range of motion.     Cervical back: Normal range of motion.  Neurological:     Mental Status: He is alert and oriented to person, place, and time.  Psychiatric:        Attention and Perception: Attention and perception normal.        Mood and Affect: Mood is anxious and depressed. Affect is blunt.        Speech: Speech normal.        Behavior: Behavior is withdrawn. Behavior is cooperative.        Thought Content: Thought content is not paranoid or delusional. Thought content includes suicidal ideation. Thought content does not include homicidal ideation. Thought content does not include homicidal or suicidal plan.        Cognition and Memory: Cognition normal.        Judgment: Judgment is  impulsive.     Mental Status Exam: General Appearance: Casual  Orientation:  Full (Time, Place, and Person)  Memory:  Immediate;   Good Recent;   Good Remote;   Good  Concentration:  Concentration: Fair  Recall:  Fair  Attention  Fair  Eye Contact:  Good  Speech:  Clear and Coherent and Slow  Language:  Good  Volume:  Decreased  Mood: Depressed  Affect:  Congruent and Depressed  Thought Process:  Linear  Thought Content:  Rumination  Suicidal Thoughts:  No  Homicidal Thoughts:  No  Judgement:  Impaired  Insight:  Lacking  Psychomotor Activity:  Normal  Akathisia:  No  Fund of Knowledge:  Good      Assets:  Architect Housing Social Support  Cognition:  WNL  ADL's:  Intact  AIMS (if indicated):        Other History   These have been pulled in through the EMR, reviewed, and updated if appropriate.  Family History:  The patient's family history includes Anxiety disorder in his mother; Depression in his mother; Hypertension in his maternal grandmother.  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/2015   Asthma    Bupropion  overdose 01/16/2020   Complication of anesthesia    gets disoriented and shakes after surgery   Deliberate self-cutting 10/17/2018   Depression    Eczema    Fracture of 5th metatarsal    Gender dysphoria    Intentional overdose of drug in tablet form (HCC) 04/03/2020   MDD (major depressive disorder), recurrent severe, without psychosis (HCC) 11/02/2021   Moderate episode of recurrent major depressive disorder (HCC) 04/24/2020   Multiple allergies    Severe episode of recurrent major depressive disorder, without psychotic features (HCC) 01/02/2020   Suicidal ideations 08/31/2022   Suicide attempt by cutting of wrist (HCC) 07/28/2022    Surgical History: Past Surgical History:  Procedure Laterality  Date   SUPPRELIN  IMPLANT Left 01/22/2019   Procedure: SUPPRELIN  IMPLANT;  Surgeon: Chuckie Casimiro KIDD, MD;  Location: [facility];  Service: Pediatrics;  Laterality: Left;   SUPPRELIN  REMOVAL N/A 03/03/2020   Procedure: SUPPRELIN  REMOVAL;  Surgeon: Chuckie,  Casimiro KIDD, MD;  Location: [facility];  Service: Pediatrics;  Laterality: N/A;   TONSILLECTOMY     TYMPANOSTOMY TUBE PLACEMENT       Medications:   Current Facility-Administered Medications:    ARIPiprazole  (ABILIFY ) tablet 15 mg, 15 mg, Oral, QHS, Yashas Camilli E, NP   busPIRone  (BUSPAR ) tablet 10 mg, 10 mg, Oral, BID, Rayson Rando E, NP, 10 mg at 04/29/24 1228   escitalopram  (LEXAPRO ) tablet 20 mg, 20 mg, Oral, Daily, Taneisha Fuson E, NP, 20 mg at 04/29/24 1229   naltrexone  (DEPADE) tablet 50 mg, 50 mg, Oral, Daily, Josephine Rudnick E, NP, 50 mg at 04/29/24 1449   pantoprazole  (PROTONIX ) EC tablet 80 mg, 80 mg, Oral, Daily, Syriah Delisi E, NP, 80 mg at 04/29/24 1229   propranolol  (INDERAL ) tablet 10 mg, 10 mg, Oral, QHS, Barak Bialecki E, NP   traZODone  (DESYREL ) tablet 50-100 mg, 50-100 mg, Oral, QHS PRN, Talar Fraley E, NP  Current Outpatient Medications:    ARIPiprazole  (ABILIFY ) 15 MG tablet, Take 1 tablet (15 mg total) by mouth at bedtime., Disp: 90 tablet, Rfl: 1   busPIRone  (BUSPAR ) 10 MG tablet, Take 1 tablet (10 mg total) by mouth 2 (two) times daily., Disp: 180 tablet, Rfl: 1   escitalopram  (LEXAPRO ) 20 MG tablet, Take 1 tablet (20 mg total) by mouth daily., Disp: 90 tablet, Rfl: 1   ibuprofen  (ADVIL ) 200 MG tablet, Take 400 mg by mouth every 6 (six) hours as needed for mild pain (pain score 1-3)., Disp: , Rfl:    metFORMIN  (GLUCOPHAGE ) 500 MG tablet, Take 1 tablet (500 mg total) by mouth 2 (two) times daily with a meal., Disp: 180 tablet, Rfl: 1   naltrexone  (DEPADE) 50 MG tablet, Take 1 tablet (50 mg total) by mouth daily., Disp: 90 tablet, Rfl: 1   omeprazole  (PRILOSEC) 40 MG capsule, Take 1 capsule  (40 mg total) by mouth in the morning and at bedtime. (Patient taking differently: Take 40 mg by mouth daily.), Disp: 180 capsule, Rfl: 1   PATADAY  0.1 % ophthalmic solution, Place 1 drop into both eyes 3 (three) times daily as needed for allergies., Disp: , Rfl:    propranolol  (INDERAL ) 10 MG tablet, Take 1 tablet (10 mg total) by mouth at bedtime., Disp: 90 tablet, Rfl: 1   Testosterone  Cypionate 200 MG/ML SOLN, Inject weekly under the skin as directed (Patient taking differently: Inject into the skin See admin instructions. Inject every Sunday under the skin as directed), Disp: 4 mL, Rfl: 1   traZODone  (DESYREL ) 50 MG tablet, Take 1-2 tablets (50-100 mg total) by mouth at bedtime as needed for sleep., Disp: 30 tablet, Rfl: 0   triamcinolone  ointment (KENALOG ) 0.1 %, APPLY TOPICALLY 2 (TWO) TIMES DAILY AS NEEDED (ECZEMA)., Disp: 30 g, Rfl: 0   albuterol  (VENTOLIN  HFA) 108 (90 Base) MCG/ACT inhaler, Inhale 1-2 puffs into the lungs every 4 (four) hours as needed for wheezing or shortness of breath. (Patient not taking: Reported on 04/29/2024), Disp: 18 g, Rfl: 1   EPINEPHrine  (EPIPEN  2-PAK) 0.3 mg/0.3 mL IJ SOAJ injection, Inject 0.3 mg into the muscle as needed for anaphylaxis (AS DIRECTED)., Disp: 2 each, Rfl: 1  Allergies: Allergies  Allergen Reactions   Egg-Derived Products Anaphylaxis   Justicia Adhatoda Anaphylaxis and Other (See Comments)    All TREE NUTS   Peanut-Containing Drug Products Anaphylaxis    Bernadette FORBES Barefoot, NP

## 2024-04-29 NOTE — ED Notes (Signed)
 [facility] has been contacted to schedule transportation to [facility].  RN has been made aware of transport.

## 2024-04-29 NOTE — ED Triage Notes (Signed)
 Patient reports he attempted to harm himself tonight by cutting hs left wrist. Presents with laceration to the left wrist. He also wrote a note of how he was feeling. States he has had suicidal attempts in the past and has been hospitalized for it.

## 2024-04-29 NOTE — ED Provider Notes (Signed)
  EMERGENCY DEPARTMENT AT [facility] Provider Note   CSN: [id number] Arrival date & time: 04/29/24  0406     Patient presents with: Suicide Attempt   Antonio [name] is a 18 y.o. adult.   The history is provided by the patient.  Mental Health Problem Presenting symptoms: self-mutilation   Onset quality:  Sudden Duration:  2 hours Timing:  Constant Progression:  Unchanged Chronicity:  New Context: not medication   Treatment compliance:  Untreated Relieved by:  Nothing Worsened by:  Nothing Ineffective treatments:  None tried Associated symptoms: no abdominal pain        Prior to Admission medications   Medication Sig Start Date End Date Taking? Authorizing Provider  ARIPiprazole  (ABILIFY ) 15 MG tablet Take 1 tablet (15 mg total) by mouth at bedtime. 03/14/24  Yes Conny Selinda RAMAN, MD  busPIRone  (BUSPAR ) 10 MG tablet Take 1 tablet (10 mg total) by mouth 2 (two) times daily. 03/14/24  Yes Conny Selinda RAMAN, MD  escitalopram  (LEXAPRO ) 20 MG tablet Take 1 tablet (20 mg total) by mouth daily. 03/14/24  Yes Conny Selinda RAMAN, MD  ibuprofen  (ADVIL ) 200 MG tablet Take 400 mg by mouth every 6 (six) hours as needed for mild pain (pain score 1-3).   Yes [provider]  metFORMIN  (GLUCOPHAGE ) 500 MG tablet Take 1 tablet (500 mg total) by mouth 2 (two) times daily with a meal. 04/27/24  Yes Hansen, Selinda RAMAN, MD  naltrexone  (DEPADE) 50 MG tablet Take 1 tablet (50 mg total) by mouth daily. 04/27/24  Yes Conny Selinda RAMAN, MD  omeprazole  (PRILOSEC) 40 MG capsule Take 1 capsule (40 mg total) by mouth in the morning and at bedtime. Patient taking differently: Take 40 mg by mouth daily. 01/04/24  Yes Rosalynn Camie CROME, MD  PATADAY  0.1 % ophthalmic solution Place 1 drop into both eyes 3 (three) times daily as needed for allergies.   Yes [provider]  propranolol  (INDERAL ) 10 MG tablet Take 1 tablet (10 mg total) by mouth at bedtime. 03/14/24  Yes Conny Selinda RAMAN, MD  Testosterone   Cypionate 200 MG/ML SOLN Inject weekly under the skin as directed Patient taking differently: Inject into the skin See admin instructions. Inject every Sunday under the skin as directed 09/04/23  Yes Rosalynn Camie CROME, MD  traZODone  (DESYREL ) 50 MG tablet Take 1-2 tablets (50-100 mg total) by mouth at bedtime as needed for sleep. 02/15/24 04/29/24 Yes Umrania, [name], MD  triamcinolone  ointment (KENALOG ) 0.1 % APPLY TOPICALLY 2 (TWO) TIMES DAILY AS NEEDED (ECZEMA). 12/19/23  Yes Rosalynn Camie CROME, MD  albuterol  (VENTOLIN  HFA) 108 (90 Base) MCG/ACT inhaler Inhale 1-2 puffs into the lungs every 4 (four) hours as needed for wheezing or shortness of breath. Patient not taking: Reported on 04/29/2024 06/08/23   Jeneal Danita Macintosh, MD  EPINEPHrine  (EPIPEN  2-PAK) 0.3 mg/0.3 mL IJ SOAJ injection Inject 0.3 mg into the muscle as needed for anaphylaxis (AS DIRECTED). 06/08/23   Jeneal Danita Macintosh, MD    Allergies: Egg-derived products, Justicia adhatoda, and Peanut-containing drug products    Review of Systems  Constitutional:  Negative for fever.  Gastrointestinal:  Negative for abdominal pain.  Psychiatric/Behavioral:  Positive for dysphoric mood and self-injury.   All other systems reviewed and are negative.   Updated Vital Signs BP 122/83 (BP Location: Right Arm)   Pulse 84   Temp 98.4 F (36.9 C)   Resp 16   Ht 5' 5 (1.651 m)   Wt  95.7 kg   SpO2 99%   BMI 35.11 kg/m   Physical Exam Vitals and nursing note reviewed.  Constitutional:      General: He is not in acute distress.    Appearance: He is well-developed. He is not diaphoretic.  HENT:     Head: Normocephalic and atraumatic.     Nose: Nose normal.  Eyes:     Conjunctiva/sclera: Conjunctivae normal.     Pupils: Pupils are equal, round, and reactive to light.  Cardiovascular:     Rate and Rhythm: Normal rate and regular rhythm.  Pulmonary:     Effort: Pulmonary effort is normal.     Breath sounds: Normal breath sounds. No  wheezing or rales.  Abdominal:     General: Bowel sounds are normal.     Palpations: Abdomen is soft.     Tenderness: There is no abdominal tenderness. There is no guarding or rebound.  Musculoskeletal:        General: Normal range of motion.     Cervical back: Normal range of motion and neck supple.  Skin:    General: Skin is warm and dry.     Capillary Refill: Capillary refill takes less than 2 seconds.  Neurological:     General: No focal deficit present.     Mental Status: He is alert and oriented to person, place, and time.  Psychiatric:        Mood and Affect: Mood normal.        Behavior: Behavior normal.     (all labs ordered are listed, but only abnormal results are displayed) Results for orders placed or performed during the hospital encounter of 04/29/24  Rapid urine drug screen (hospital performed)   Collection Time: 04/29/24  4:17 AM  Result Value Ref Range   Opiates NONE DETECTED NONE DETECTED   Cocaine NONE DETECTED NONE DETECTED   Benzodiazepines NONE DETECTED NONE DETECTED   Amphetamines NONE DETECTED NONE DETECTED   Tetrahydrocannabinol NONE DETECTED NONE DETECTED   Barbiturates NONE DETECTED NONE DETECTED  Comprehensive metabolic panel   Collection Time: 04/29/24  4:28 AM  Result Value Ref Range   Sodium 135 135 - 145 mmol/L   Potassium 3.8 3.5 - 5.1 mmol/L   Chloride 105 98 - 111 mmol/L   CO2 24 22 - 32 mmol/L   Glucose, Bld 94 70 - 99 mg/dL   BUN 6 6 - 20 mg/dL   Creatinine, Ser 8.96 0.61 - 1.24 mg/dL   Calcium  9.3 8.9 - 10.3 mg/dL   Total Protein 7.8 6.5 - 8.1 g/dL   Albumin 4.0 3.5 - 5.0 g/dL   AST 15 15 - 41 U/L   ALT 14 0 - 44 U/L   Alkaline Phosphatase 66 38 - 126 U/L   Total Bilirubin 0.8 0.0 - 1.2 mg/dL   GFR, Estimated >39 >39 mL/min   Anion gap 6 5 - 15  Ethanol   Collection Time: 04/29/24  4:28 AM  Result Value Ref Range   Alcohol, Ethyl (B) <15 <15 mg/dL  cbc   Collection Time: 04/29/24  4:28 AM  Result Value Ref Range   WBC 5.6  4.0 - 10.5 K/uL   RBC 5.29 4.22 - 5.81 MIL/uL   Hemoglobin 13.7 13.0 - 17.0 g/dL   HCT 57.7 60.9 - 47.9 %   MCV 79.8 (L) 80.0 - 100.0 fL   MCH 25.9 (L) 26.0 - 34.0 pg   MCHC 32.5 30.0 - 36.0 g/dL   RDW 85.9 88.4 -  15.5 %   Platelets 272 150 - 400 K/uL   nRBC 0.0 0.0 - 0.2 %  hCG, serum, qualitative   Collection Time: 04/29/24  4:28 AM  Result Value Ref Range   Preg, Serum NEGATIVE NEGATIVE   No results found.  Radiology: No results found.   Procedures   Medications Ordered in the ED - No data to display                                  Medical Decision Making Superficial laceration to the volar left wrist, does cut   Amount and/or Complexity of Data Reviewed External Data Reviewed: notes.    Details: Previous notes notes reviewed  Labs: ordered.    Details: UDS negative, normal sodium 135, normal potassium 3.8, normal creatinine 1.03, preg negative, negative ETOH   Risk Risk Details: Medically cleared and voluntary here      Final diagnoses:  Suicidal ideation  Superficial laceration   The patient has been placed in psychiatric observation due to the need to provide a safe environment for the patient while obtaining psychiatric consultation and evaluation, as well as ongoing medical and medication management to treat the patient's condition.  The patient has not been placed under full IVC at this time.  ED Discharge Orders     None          Derreon Consalvo, MD 04/29/24 [phone]

## 2024-04-29 NOTE — Group Note (Signed)
 Date:  04/29/2024 Time:  9:05 PM  Group Topic/Focus:  Wrap-Up Group:   The focus of this group is to help patients review their daily goal of treatment and discuss progress on daily workbooks.    Participation Level:  None  Participation Quality:  Appropriate  Affect:  Appropriate  Cognitive:  Alert  Insight: None  Engagement in Group:  None  Modes of Intervention:  Discussion and Education  Additional Comments:  Pt attended wrap up group, but opted out of participating this evening.    Rosella DELENA Pouch 04/29/2024, 9:05 PM

## 2024-04-29 NOTE — ED Notes (Addendum)
 Patient breakfast tray was called at 0830, patient has allergic to eggs, tray came up with eggs. Patient breakfast had to be reordered, he eventually receives tray at 0940

## 2024-04-29 NOTE — Tx Team (Signed)
 Initial Treatment Plan 04/29/2024 6:30 PM [name] FMW:[id number]    PATIENT STRESSORS: Marital or family conflict     PATIENT STRENGTHS: General fund of knowledge  Supportive family/friends    PATIENT IDENTIFIED PROBLEMS: Major Depressive Disorder                      DISCHARGE CRITERIA:  Improved stabilization in mood, thinking, and/or behavior Motivation to continue treatment in a less acute level of care Need for constant or close observation no longer present Verbal commitment to aftercare and medication compliance  PRELIMINARY DISCHARGE PLAN: Outpatient therapy Return to previous living arrangement  PATIENT/FAMILY INVOLVEMENT: This treatment plan has been presented to and reviewed with the patient, Antonio [name].  The patient and family have been given the opportunity to ask questions and make suggestions.  Dorathy Stallone M Aaric Dolph, RN 04/29/2024, 6:30 PM

## 2024-04-30 ENCOUNTER — Encounter (HOSPITAL_COMMUNITY): Payer: Self-pay

## 2024-04-30 DIAGNOSIS — F332 Major depressive disorder, recurrent severe without psychotic features: Principal | ICD-10-CM

## 2024-04-30 MED ORDER — PROPRANOLOL HCL 10 MG PO TABS: 10.0000 mg | ORAL_TABLET | Freq: Two times a day (BID) | ORAL | Status: DC | PRN
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
## 2024-04-30 NOTE — Progress Notes (Signed)
 Nurse changed bandage on patient left forearm. Wound occurred prior to admission to [facility]. No drainage at the moment.
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
## 2024-04-30 NOTE — Group Note (Signed)
 Occupational Therapy Group Note  Group Topic:Coping Skills  Group Date: 04/30/2024 Start Time: 1500 End Time: 1506 Facilitators: Dot [place] MATSU, OT   Group Description: Group encouraged increased engagement and participation through discussion and activity focused on Coping Ahead. Patients were split up into teams and selected a card from a stack of positive coping strategies. Patients were instructed to act out/charade the coping skill for other peers to guess and receive points for their team. Discussion followed with a focus on identifying additional positive coping strategies and patients shared how they were going to cope ahead over the weekend while continuing hospitalization stay.  Therapeutic Goal(s): Identify positive vs negative coping strategies. Identify coping skills to be used during hospitalization vs coping skills outside of hospital/at home Increase participation in therapeutic group environment and promote engagement in treatment   Participation Level: Engaged   Participation Quality: Independent   Behavior: Appropriate   Speech/Thought Process: Relevant   Affect/Mood: Appropriate   Insight: Fair   Judgement: Fair      Modes of Intervention: Education  Patient Response to Interventions:  Attentive   Plan: Continue to engage patient in OT groups 2 - 3x/week.  04/30/2024  [place] MATSU Dot, OT  Antonio [name], OT

## 2024-04-30 NOTE — BHH Group Notes (Signed)
 The focus of this group is to help patients review their daily goal of treatment and discuss progress on daily workbooks. Pt was attentive and appropriate during tonight's wrap up group discussion. Pt was able to share that today was an good day. Was able to write letter to mom. Stated that not sure if letter will be read out loud or given to read.

## 2024-04-30 NOTE — Plan of Care (Signed)
   Problem: Education: Goal: Emotional status will improve Outcome: Progressing Goal: Mental status will improve Outcome: Progressing Goal: Verbalization of understanding the information provided will improve Outcome: Progressing

## 2024-04-30 NOTE — Progress Notes (Signed)
(  Sleep Hours) -7.75  (Any PRNs that were needed, meds refused, or side effects to meds)- PRN Trazodone  100 mg  (Any disturbances and when (visitation, over night)-N/A  (Concerns raised by the patient)- N/A  (SI/HI/AVH)-denies

## 2024-04-30 NOTE — Progress Notes (Signed)
 Adult Psychoeducational Group Note  Date:  04/30/2024 Time:  9:19 AM  Group Topic/Focus:  Goals Group:   The focus of this group is to help patients establish daily goals to achieve during treatment and discuss how the patient can incorporate goal setting into their daily lives to aide in recovery.  Participation Level:  Active  Participation Quality:  Appropriate  Affect:  Appropriate  Cognitive:  Appropriate  Insight: Appropriate  Engagement in Group:  Engaged  Modes of Intervention:  Discussion  Additional Comments:  Pt goal for the day is to write a letter to mom.  Daine Pillar D 04/30/2024, 9:19 AM

## 2024-04-30 NOTE — Progress Notes (Signed)
   04/30/24 0933  Psychosocial Assessment  Patient Complaints None  Eye Contact Fair  Facial Expression Animated  Affect Appropriate to circumstance  Speech Logical/coherent  Interaction Assertive  Motor Activity Other (Comment) (wnl)  Appearance/Hygiene Unremarkable  Behavior Characteristics Cooperative  Mood Pleasant  Thought Process  Coherency WDL  Content WDL  Delusions None reported or observed  Perception WDL  Hallucination None reported or observed  Judgment WDL  Confusion WDL  Danger to Self  Current suicidal ideation? Denies  Danger to Others  Danger to Others None reported or observed

## 2024-04-30 NOTE — BH IP Treatment Plan (Signed)
 Interdisciplinary Treatment and Diagnostic Plan Update  04/30/2024 Time of Session: 10:50AM Antonio [name] MRN: [id number]  Principal Diagnosis: MDD (major depressive disorder), recurrent episode, severe (HCC)  Secondary Diagnoses: Principal Problem:   MDD (major depressive disorder), recurrent episode, severe (HCC)   Current Medications:  Current Facility-Administered Medications  Medication Dose Route Frequency Provider Last Rate Last Admin   albuterol  (VENTOLIN  HFA) 108 (90 Base) MCG/ACT inhaler 1-2 puff  1-2 puff Inhalation Q4H PRN Mills, Shnese E, NP       alum & mag hydroxide-simeth (MAALOX/MYLANTA) 200-200-20 MG/5ML suspension 30 mL  30 mL Oral Q4H PRN Mills, Shnese E, NP       ARIPiprazole  (ABILIFY ) tablet 15 mg  15 mg Oral QHS Mills, Shnese E, NP   15 mg at 04/29/24 2128   busPIRone  (BUSPAR ) tablet 10 mg  10 mg Oral BID Mills, Shnese E, NP   10 mg at 04/30/24 9093   haloperidol  (HALDOL ) tablet 5 mg  5 mg Oral TID PRN Moishe Bernadette BRAVO, NP       And   diphenhydrAMINE  (BENADRYL ) capsule 50 mg  50 mg Oral TID PRN Mills, Shnese E, NP       haloperidol  lactate (HALDOL ) injection 5 mg  5 mg Intramuscular TID PRN Moishe Bernadette BRAVO, NP       And   diphenhydrAMINE  (BENADRYL ) injection 50 mg  50 mg Intramuscular TID PRN Moishe Bernadette BRAVO, NP       And   LORazepam  (ATIVAN ) injection 2 mg  2 mg Intramuscular TID PRN Mills, Shnese E, NP       haloperidol  lactate (HALDOL ) injection 10 mg  10 mg Intramuscular TID PRN Moishe Bernadette BRAVO, NP       And   diphenhydrAMINE  (BENADRYL ) injection 50 mg  50 mg Intramuscular TID PRN Moishe Bernadette BRAVO, NP       And   LORazepam  (ATIVAN ) injection 2 mg  2 mg Intramuscular TID PRN Mills, Shnese E, NP       EPINEPHrine  (EPI-PEN) injection 0.3 mg  0.3 mg Intramuscular PRN Mills, Shnese E, NP       escitalopram  (LEXAPRO ) tablet 20 mg  20 mg Oral Daily Mills, Shnese E, NP   20 mg at 04/30/24 9092   ibuprofen  (ADVIL ) tablet 400 mg  400 mg Oral Q6H PRN Mills, Shnese E,  NP       magnesium  hydroxide (MILK OF MAGNESIA) suspension 30 mL  30 mL Oral Daily PRN Mills, Shnese E, NP       naltrexone  (DEPADE) tablet 50 mg  50 mg Oral Daily Mills, Shnese E, NP   50 mg at 04/30/24 0911   olopatadine  (PATANOL) 0.1 % ophthalmic solution 1 drop  1 drop Both Eyes TID PRN Moishe Bernadette BRAVO, NP       pantoprazole  (PROTONIX ) EC tablet 80 mg  80 mg Oral Daily Mills, Shnese E, NP   80 mg at 04/30/24 9092   propranolol  (INDERAL ) tablet 10 mg  10 mg Oral QHS Mills, Shnese E, NP   10 mg at 04/29/24 2127   traZODone  (DESYREL ) tablet 50-100 mg  50-100 mg Oral QHS PRN Mills, Shnese E, NP   100 mg at 04/29/24 2127   triamcinolone  ointment (KENALOG ) 0.1 %   Topical BID PRN Mills, Shnese E, NP       PTA Medications: Medications Prior to Admission  Medication Sig Dispense Refill Last Dose/Taking   albuterol  (VENTOLIN  HFA) 108 (90 Base) MCG/ACT inhaler Inhale 1-2 puffs  into the lungs every 4 (four) hours as needed for wheezing or shortness of breath. (Patient not taking: Reported on 04/29/2024) 18 g 1    ARIPiprazole  (ABILIFY ) 15 MG tablet Take 1 tablet (15 mg total) by mouth at bedtime. 90 tablet 1    busPIRone  (BUSPAR ) 10 MG tablet Take 1 tablet (10 mg total) by mouth 2 (two) times daily. 180 tablet 1    EPINEPHrine  (EPIPEN  2-PAK) 0.3 mg/0.3 mL IJ SOAJ injection Inject 0.3 mg into the muscle as needed for anaphylaxis (AS DIRECTED). 2 each 1    escitalopram  (LEXAPRO ) 20 MG tablet Take 1 tablet (20 mg total) by mouth daily. 90 tablet 1    ibuprofen  (ADVIL ) 200 MG tablet Take 400 mg by mouth every 6 (six) hours as needed for mild pain (pain score 1-3).      metFORMIN  (GLUCOPHAGE ) 500 MG tablet Take 1 tablet (500 mg total) by mouth 2 (two) times daily with a meal. 180 tablet 1    naltrexone  (DEPADE) 50 MG tablet Take 1 tablet (50 mg total) by mouth daily. 90 tablet 1    omeprazole  (PRILOSEC) 40 MG capsule Take 1 capsule (40 mg total) by mouth in the morning and at bedtime. (Patient taking  differently: Take 40 mg by mouth daily.) 180 capsule 1    PATADAY  0.1 % ophthalmic solution Place 1 drop into both eyes 3 (three) times daily as needed for allergies.      propranolol  (INDERAL ) 10 MG tablet Take 1 tablet (10 mg total) by mouth at bedtime. 90 tablet 1    Testosterone  Cypionate 200 MG/ML SOLN Inject weekly under the skin as directed (Patient taking differently: Inject into the skin See admin instructions. Inject every Sunday under the skin as directed) 4 mL 1    traZODone  (DESYREL ) 50 MG tablet Take 1-2 tablets (50-100 mg total) by mouth at bedtime as needed for sleep. 30 tablet 0    triamcinolone  ointment (KENALOG ) 0.1 % APPLY TOPICALLY 2 (TWO) TIMES DAILY AS NEEDED (ECZEMA). 30 g 0     Patient Stressors: Marital or family conflict    Patient Strengths: General fund of knowledge  Supportive family/friends   Treatment Modalities: Medication Management, Group therapy, Case management,  1 to 1 session with clinician, Psychoeducation, Recreational therapy.   Physician Treatment Plan for Primary Diagnosis: MDD (major depressive disorder), recurrent episode, severe (HCC) Long Term Goal(s):     Short Term Goals:    Medication Management: Evaluate patient's response, side effects, and tolerance of medication regimen.  Therapeutic Interventions: 1 to 1 sessions, Unit Group sessions and Medication administration.  Evaluation of Outcomes: Not Progressing  Physician Treatment Plan for Secondary Diagnosis: Principal Problem:   MDD (major depressive disorder), recurrent episode, severe (HCC)  Long Term Goal(s):     Short Term Goals:       Medication Management: Evaluate patient's response, side effects, and tolerance of medication regimen.  Therapeutic Interventions: 1 to 1 sessions, Unit Group sessions and Medication administration.  Evaluation of Outcomes: Not Progressing   RN Treatment Plan for Primary Diagnosis: MDD (major depressive disorder), recurrent episode,  severe (HCC) Long Term Goal(s): Knowledge of disease and therapeutic regimen to maintain health will improve  Short Term Goals: Ability to remain free from injury will improve, Ability to verbalize frustration and anger appropriately will improve, Ability to demonstrate self-control, Ability to participate in decision making will improve, Ability to verbalize feelings will improve, and Ability to disclose and discuss suicidal ideas  Medication Management: RN  will administer medications as ordered by provider, will assess and evaluate patient's response and provide education to patient for prescribed medication. RN will report any adverse and/or side effects to prescribing provider.  Therapeutic Interventions: 1 on 1 counseling sessions, Psychoeducation, Medication administration, Evaluate responses to treatment, Monitor vital signs and CBGs as ordered, Perform/monitor CIWA, COWS, AIMS and Fall Risk screenings as ordered, Perform wound care treatments as ordered.  Evaluation of Outcomes: Not Progressing   LCSW Treatment Plan for Primary Diagnosis: MDD (major depressive disorder), recurrent episode, severe (HCC) Long Term Goal(s): Safe transition to appropriate next level of care at discharge, Engage patient in therapeutic group addressing interpersonal concerns.  Short Term Goals: Engage patient in aftercare planning with referrals and resources, Increase social support, Increase ability to appropriately verbalize feelings, Increase emotional regulation, Facilitate acceptance of mental health diagnosis and concerns, Facilitate patient progression through stages of change regarding substance use diagnoses and concerns, and Identify triggers associated with mental health/substance abuse issues  Therapeutic Interventions: Assess for all discharge needs, 1 to 1 time with Social worker, Explore available resources and support systems, Assess for adequacy in community support network, Educate family and  significant other(s) on suicide prevention, Complete Psychosocial Assessment, Interpersonal group therapy.  Evaluation of Outcomes: Not Progressing   Progress in Treatment: Attending groups: Yes. Participating in groups: No. Taking medication as prescribed: Yes. Toleration medication: Yes. Family/Significant other contact made: No, will contact:  Antonio [name] (mother) [phone] Patient understands diagnosis: Yes. Discussing patient identified problems/goals with staff: Yes. Medical problems stabilized or resolved: Yes. Denies suicidal/homicidal ideation: Yes. Issues/concerns per patient self-inventory: No.  Patient Goals:  to work on better communication with my mom   Discharge Plan or Barriers: Pt will likely discharge home once stable.   Reason for Continuation of Hospitalization: Depression Medication stabilization  Estimated Length of Stay: 5-7 days  Last 3 [place] Suicide Severity Risk Score: Flowsheet Row [facility] Most recent reading at 04/29/2024  5:00 PM ED from 04/29/2024 in [facility] Most recent reading at 04/29/2024  4:16 AM Ad[facility] (Discharged) [facility] Most recent reading at 01/28/2024  1:00 PM  C-SSRS RISK CATEGORY High Risk High Risk High Risk    Last PHQ 2/9 Scores:    10/12/2023    8:35 AM 08/31/2023    8:44 AM 05/25/2023    9:27 AM  Depression screen PHQ 2/9  Decreased Interest 0 1 1  Down, Depressed, Hopeless 0 1 0  PHQ - 2 Score 0 2 1  Altered sleeping 3 3 3   Tired, decreased energy 3 3 3   Change in appetite 1 2 1   Feeling bad or failure about yourself  1 1 1   Trouble concentrating 0 1 1  Moving slowly or fidgety/restless 0 0 0  Suicidal thoughts 0 1 1  PHQ-9 Score 8 13 11   Difficult doing work/chores   Somewhat difficult    Scribe for Treatment Team: Jaquilla Woodroof M Briasia Flinders,  ISRAEL 04/30/2024 11:51 AM

## 2024-04-30 NOTE — Plan of Care (Signed)
   Problem: Education: Goal: Emotional status will improve Outcome: Progressing Goal: Mental status will improve Outcome: Progressing   Problem: Activity: Goal: Interest or engagement in activities will improve Outcome: Progressing

## 2024-04-30 NOTE — Progress Notes (Signed)
 Pt did attend emotional wellness group and actively participated.

## 2024-04-30 NOTE — Progress Notes (Addendum)
(  Sleep Hours) 7  (Any PRNs that were needed, meds refused, or side effects to meds)- Trazodone  100 mg  (Any disturbances and when (visitation, over night)-N/A  (Concerns raised by the patient)- N/A  (SI/HI/AVH)- denies

## 2024-04-30 NOTE — Plan of Care (Signed)
   Problem: Education: Goal: Emotional status will improve Outcome: Progressing Goal: Mental status will improve Outcome: Progressing   Problem: Activity: Goal: Interest or engagement in activities will improve Outcome: Progressing Goal: Sleeping patterns will improve Outcome: Progressing

## 2024-04-30 NOTE — [facility] Suicide Risk Assessment (Signed)
 [facility] INPATIENT:  Family/Significant Other Suicide Prevention Education  Suicide Prevention Education:  Education Completed; [name] [phone] (mother),  (name of family member/significant other) has been identified by the patient as the family member/significant other with whom the patient will be residing, and identified as the person(s) who will aid the patient in the event of a mental health crisis (suicidal ideations/suicide attempt).  With written consent from the patient, the family member/significant other has been provided the following suicide prevention education, prior to the and/or following the discharge of the patient.  The suicide prevention education provided includes the following: Suicide risk factors Suicide prevention and interventions National Suicide Hotline telephone number [facility] assessment telephone number [facility] Emergency Assistance 911 [facility] and/or Residential Mobile Crisis Unit telephone number  Request made of family/significant other to: Remove weapons (e.g., guns, rifles, knives), all items previously/currently identified as safety concern.   Remove drugs/medications (over-the-counter, prescriptions, illicit drugs), all items previously/currently identified as a safety concern.  Mother acknowledged weapons in the home including guns, shared that they are always stored and locked away safely. Mother also shared that she keeps sharp objects (razors, knives, box cutters) and all OTC/prescription locked up at home. Mother states patient can return home upon discharge.  The family member/significant other verbalizes understanding of the suicide prevention education information provided.  The family member/significant other agrees to remove the items of safety concern listed above.  Louetta Lame 04/30/2024, 1:03 PM
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
## 2024-05-01 MED ORDER — PROPRANOLOL HCL 10 MG PO TABS: 10.0000 mg | ORAL_TABLET | Freq: Two times a day (BID) | ORAL | 0 refills | Status: AC | PRN

## 2024-05-01 NOTE — Group Note (Signed)
 Date:  05/01/2024 Time:  9:03 AM  Group Topic/Focus:  Goals Group:   The focus of this group is to help patients establish daily goals to achieve during treatment and discuss how the patient can incorporate goal setting into their daily lives to aide in recovery.    Participation Level:  Active  Participation Quality:  Appropriate  Affect:  Appropriate  Cognitive:  Alert and Appropriate  Insight: Appropriate  Engagement in Group:  Engaged  Modes of Intervention:  Discussion  Additional Comments:  Pt goal is to speak with parents.  Antonio [name] 05/01/2024, 9:03 AM

## 2024-05-01 NOTE — Plan of Care (Signed)

## 2024-05-01 NOTE — Progress Notes (Signed)
   05/01/24 0911  Psych Admission Type (Psych Patients Only)  Admission Status Voluntary  Psychosocial Assessment  Patient Complaints None  Eye Contact Fair  Facial Expression Flat  Affect Appropriate to circumstance  Speech Logical/coherent  Interaction Assertive  Motor Activity Slow  Appearance/Hygiene Unremarkable  Behavior Characteristics Cooperative;Appropriate to situation  Mood Pleasant  Aggressive Behavior  Effect No apparent injury  Thought Process  Coherency WDL  Content WDL  Delusions WDL  Perception WDL  Hallucination None reported or observed  Judgment WDL  Confusion WDL  Danger to Self  Current suicidal ideation? Denies  Agreement Not to Harm Self Yes  Description of Agreement verbal  Danger to Others  Danger to Others None reported or observed   Pt awake and alert x 4.  Affect flat but does brighten with engagement.   Pt denies SI, HI or AVH.  Pt's wound on Left wrist is covered with clean telfa dressing.  Wound is apx 4-5 cm. Wound is not approximated and is open with no redness or discharge noted.  Pt encouraged to keep wound clean and show it to provider.   Provider will be notified via secure chat for further instructions.

## 2024-05-01 NOTE — Plan of Care (Signed)
   Problem: Education: Goal: Emotional status will improve Outcome: Progressing Goal: Mental status will improve Outcome: Progressing Goal: Verbalization of understanding the information provided will improve Outcome: Progressing   Problem: Activity: Goal: Interest or engagement in activities will improve Outcome: Progressing

## 2024-05-01 NOTE — Progress Notes (Addendum)
 [facility] MD Progress Note  05/01/2024 1:24 PM Antonio [name]  MRN:  [id number]  Principal Problem: MDD (major depressive disorder), recurrent episode, severe (HCC) Diagnosis: Principal Problem:   MDD (major depressive disorder), recurrent episode, severe (HCC)   Reason for Admission:  Antonio [name] is a 18 y.o., adult with history of ASD level 1, social anxiety disorder, major depressive disorder with multiple psychiatric hospitalizations in the context of suicide by cutting/OD/swallowing blade, borderline personality traits as well as self-mutilating behaviors presents to [facility] due to self-injurious behavior. Patient reports medication compliance with Lexapro  20, Abilify  15, esketamine 84 mg weekly, trazodone  as needed, BuSpar  10 mg twice daily, propranolol  10 mg daily, naltrexone  50 mg daily (admitted on 04/29/2024, total  LOS: 2 days ).   ASSESSMENT: Patient is overall stable in terms of mood with significant improvement with socialization while hospitalized. Patient is likely to benefit from this outpatient as to reduce frequency of hospitalization including employment or attending college. Plan for discharge tomorrow.    PLAN: #Major depressive disorder, recurrent, severe, without psychotic features, in current episode #Generalized anxiety disorder #Social anxiety disorder #Autism spectrum disorder (level 1) assistance #Borderline personality traits #Nonsuicidal self-injury - Continue home Abilify  15 mg daily - Continue home Lexapro  20 mg daily for depressed mood. - Continue home buspirone  10 mg twice daily - Continue propranolol  to 10 mg bid prn - stop metformin  as patient is not diabetic and does not appear to have signs of antipsychotic induced metabolic symptoms - Continue home naltrexone  50 mg daily   Disposition Planning: -- Estimated LOS: 2 days --Estimated Discharge Date 05/02/24 --Barriers to Discharge: depression -- Discharge Goals: Return home with outpatient referrals for  mental health follow-up including medication management/psychotherapy  Subjective: The patient was seen and evaluated on the unit. On assessment today the patient reports sleeping well and eating has improved as his depressive symptoms have improved. He attributes improvement in mood due to being around people. He will try to do this outpatient as well. Denies SI/HI/AVH. He will work to talk with parents today and not be avoidant. All questions were addressed. He reports tolerating home medications well.    Objective: Chart Review from last 24 hours:  The patient's chart was reviewed and nursing notes were reviewed. The patient's case was discussed in multidisciplinary team meeting.  - Overnight events to report per chart review / staff report: none - Patient took all prescribed medications yes - Patient received the following PRNs: none  Current Medications: Current Facility-Administered Medications  Medication Dose Route Frequency Provider Last Rate Last Admin   albuterol  (VENTOLIN  HFA) 108 (90 Base) MCG/ACT inhaler 1-2 puff  1-2 puff Inhalation Q4H PRN Mills, Shnese E, NP       alum & mag hydroxide-simeth (MAALOX/MYLANTA) 200-200-20 MG/5ML suspension 30 mL  30 mL Oral Q4H PRN Mills, Shnese E, NP       ARIPiprazole  (ABILIFY ) tablet 15 mg  15 mg Oral QHS Mills, Shnese E, NP   15 mg at 04/30/24 2101   busPIRone  (BUSPAR ) tablet 10 mg  10 mg Oral BID Mills, Shnese E, NP   10 mg at 05/01/24 9187   haloperidol  (HALDOL ) tablet 5 mg  5 mg Oral TID PRN Moishe Bernadette BRAVO, NP       And   diphenhydrAMINE  (BENADRYL ) capsule 50 mg  50 mg Oral TID PRN Mills, Shnese E, NP       haloperidol  lactate (HALDOL ) injection 5 mg  5 mg Intramuscular TID PRN Moishe Bernadette  E, NP       And   diphenhydrAMINE  (BENADRYL ) injection 50 mg  50 mg Intramuscular TID PRN Moishe Bernadette BRAVO, NP       And   LORazepam  (ATIVAN ) injection 2 mg  2 mg Intramuscular TID PRN Mills, Shnese E, NP       haloperidol  lactate (HALDOL )  injection 10 mg  10 mg Intramuscular TID PRN Moishe Bernadette BRAVO, NP       And   diphenhydrAMINE  (BENADRYL ) injection 50 mg  50 mg Intramuscular TID PRN Moishe Bernadette BRAVO, NP       And   LORazepam  (ATIVAN ) injection 2 mg  2 mg Intramuscular TID PRN Mills, Shnese E, NP       EPINEPHrine  (EPI-PEN) injection 0.3 mg  0.3 mg Intramuscular PRN Mills, Shnese E, NP       escitalopram  (LEXAPRO ) tablet 20 mg  20 mg Oral Daily Mills, Shnese E, NP   20 mg at 05/01/24 9187   ibuprofen  (ADVIL ) tablet 400 mg  400 mg Oral Q6H PRN Mills, Shnese E, NP   400 mg at 05/01/24 1138   magnesium  hydroxide (MILK OF MAGNESIA) suspension 30 mL  30 mL Oral Daily PRN Mills, Shnese E, NP       naltrexone  (DEPADE) tablet 50 mg  50 mg Oral Daily Mills, Shnese E, NP   50 mg at 05/01/24 9187   olopatadine  (PATANOL) 0.1 % ophthalmic solution 1 drop  1 drop Both Eyes TID PRN Mills, Shnese E, NP       pantoprazole  (PROTONIX ) EC tablet 80 mg  80 mg Oral Daily Mills, Shnese E, NP   80 mg at 05/01/24 9187   propranolol  (INDERAL ) tablet 10 mg  10 mg Oral BID PRN Lynnette Barter, MD       traZODone  (DESYREL ) tablet 50-100 mg  50-100 mg Oral QHS PRN Mills, Shnese E, NP   100 mg at 04/30/24 2103   triamcinolone  ointment (KENALOG ) 0.1 %   Topical BID PRN Moishe Bernadette BRAVO, NP        Lab Results: No results found for this or any previous visit (from the past 48 hours).  Blood Alcohol level:  Lab Results  Component Value Date   [facility] <15 04/29/2024   ETH <15 01/28/2024    Metabolic Labs: Lab Results  Component Value Date   HGBA1C 5.1 01/30/2024   MPG 99.67 01/30/2024   MPG 99.67 12/30/2022   Lab Results  Component Value Date   PROLACTIN 10.0 09/01/2022   PROLACTIN 10.3 04/16/2022   Lab Results  Component Value Date   CHOL 212 (H) 01/30/2024   TRIG 105 01/30/2024   HDL 38 (L) 01/30/2024   CHOLHDL 5.6 01/30/2024   VLDL 21 01/30/2024   LDLCALC 153 (H) 01/30/2024   LDLCALC 137 (H) 12/30/2022    Physical Findings: AIMS: No  CIWA:     COWS:     Psychiatric Specialty Exam: General Appearance: Appropriate for Environment; Casual   Eye Contact: Good   Speech: Clear and Coherent   Volume: Normal   Mood: anxious  Affect: Appropriate   Thought Content: Logical   Suicidal Thoughts: denies  Homicidal Thoughts: denies  Thought Process: Linear   Orientation: Full (Time, Place and Person)     Memory: Immediate Fair   Judgment: Fair   Insight: Fair   Concentration: Fair   Recall: Fair   Fund of Knowledge: Fair   Language: Fair   Psychomotor Activity: wnl  Assets: Communication Skills;  Financial Resources/Insurance; Housing; Resilience; Social Support   Sleep: No data recorded   Review of Systems ROS  Vital Signs: Blood pressure 117/83, pulse 87, temperature 98.6 F (37 C), temperature source Oral, resp. rate 15, height 5' 5 (1.651 m), weight 95.7 kg, SpO2 99%. Body mass index is 35.11 kg/m. Physical Exam  I certify that inpatient services furnished can reasonably be expected to improve the patient's condition.   Signed: Prentice Espy, MD 05/01/2024, 1:24 PM

## 2024-05-01 NOTE — Group Note (Signed)
 LCSW Group Therapy Note   Group Date: 05/01/2024 Start Time: 1100 End Time: 1200   Participation:  patient was present and actively participated in the discussion.  Type of Therapy:  Group Therapy  Topic:  Speaking from the Heart: Communicating with Understanding and Empathy  Objective:  To help participants develop effective communication skills to express themselves clearly, listen actively, and navigate conflicts in a healthy way.  Goals: Increase awareness of verbal and non-verbal communication skills. Practice using "I" statements and active listening techniques. Learn coping strategies for managing communication stress.  Summary:  Participants explored the importance of communication, discussed challenges, and practiced skills such as active listening and assertive expression. They reflected on past experiences and identified ways to improve communication in their daily lives.  Therapeutic Modalities: Cognitive-Behavioral Therapy (CBT): Restructuring negative thought patterns in communication. Mindfulness: Staying present and calm during conversations. Psychoeducation: Learning about effective communication techniques.   Antonio [name] [name], LCSWA 05/01/2024  12:36 PM

## 2024-05-01 NOTE — Progress Notes (Signed)
 Group Topic/Focus:  Number of participants : 11 Medication compliance and side effect.  Discussed the importance of medication compliance in achieving the desired therapeutic effect by adhering to the required regimen.  Patients educated about some common side effects and were encouraged to reports to their provider any side effects.  Adequate food and fluid intake, sleep and support from family members are also helpful. Patient was attentive and receptive.

## 2024-05-02 DIAGNOSIS — F840 Autistic disorder: Secondary | ICD-10-CM

## 2024-05-02 DIAGNOSIS — F411 Generalized anxiety disorder: Secondary | ICD-10-CM

## 2024-05-02 DIAGNOSIS — F4010 Social phobia, unspecified: Secondary | ICD-10-CM

## 2024-05-02 NOTE — Progress Notes (Signed)
(  Sleep Hours) -6.5   (Any PRNs that were needed, meds refused, or side effects to meds)-  Trazodone  100 mg (Any disturbances and when (visitation, over night)- N/A  (Concerns raised by the patient)- N/A  (SI/HI/AVH)-denies

## 2024-05-02 NOTE — Progress Notes (Signed)
   05/02/24 0755  Psych Admission Type (Psych Patients Only)  Admission Status Voluntary  Psychosocial Assessment  Patient Complaints None  Eye Contact Fair  Facial Expression Animated  Affect Appropriate to circumstance  Speech Logical/coherent  Interaction Assertive  Motor Activity Other (Comment) (WNL)  Appearance/Hygiene Unremarkable  Behavior Characteristics Cooperative;Appropriate to situation  Mood Pleasant  Thought Process  Coherency WDL  Content WDL  Delusions None reported or observed  Perception WDL  Hallucination None reported or observed  Judgment WDL  Confusion None  Danger to Self  Current suicidal ideation? Denies  Agreement Not to Harm Self Yes  Description of Agreement verbal  Danger to Others  Danger to Others None reported or observed

## 2024-05-02 NOTE — Group Note (Signed)
 Date:  05/02/2024 Time:  9:21 AM  Group Topic/Focus:  Goals Group:   The focus of this group is to help patients establish daily goals to achieve during treatment and discuss how the patient can incorporate goal setting into their daily lives to aide in recovery. Orientation:   The focus of this group is to educate the patient on the purpose and policies of crisis stabilization and provide a format to answer questions about their admission.  The group details unit policies and expectations of patients while admitted.    Participation Level:  Did Not Attend  Antonio [name] Mars 05/02/2024, 9:21 AM

## 2024-05-02 NOTE — Discharge Summary (Signed)
 Physician Discharge Summary Note Patient:  Antonio [name] is an 18 y.o., adult MRN:  [id number] DOB:  June 06, 2006 Patient phone:  [phone] (home)  Patient address:   [address] [place] [facility] [address],  Total Time spent with patient: 1 hour  Date of Admission:  04/29/2024 Date of Discharge: 05/02/24  Reason for Admission: Antonio [name] is a 30 y.o., adult with history of ASD level 1, social anxiety disorder, major depressive disorder with multiple psychiatric hospitalizations in the context of suicide by cutting/OD/swallowing blade, borderline personality traits as well as self-mutilating behaviors presents to [facility] due to self-injurious behavior. Patient reports medication compliance with Lexapro  20, Abilify  15, esketamine 84 mg weekly, trazodone  as needed, BuSpar  10 mg twice daily, propranolol  10 mg daily, naltrexone  50 mg daily (admitted on 04/29/2024, total  LOS: 2 days ).   Hospital Course Psychiatric diagnoses provided upon initial assessment:  #Major depressive disorder, recurrent, severe, without psychotic features, in current episode #Generalized anxiety disorder #Social anxiety disorder #Autism spectrum disorder (level 1) assistance #Borderline personality traits  Patient's psychiatric medications were adjusted on admission:  -Changed propranolol  to 10 mg bid prn for social anxiety -stopped metformin  as patient does not have diabetes nor on an antipsychotic that is known to cause severe weight gain -Otherwise continued home medications of Abilify  15 mg, lexapro  20 mg, buspirone  10 mg twice daily, and naltrexone  50 mg.   Patient's chief complaints and NSSIB appear to stem from lack of socialization and structure. Patient's frequent hospitalization appear to be a form of reassurance and safety for the patient. Encouraged patient to identify the aspects that patient finds reassuring and supportive in the hospital and identify ways this can be emulated outpatient. DBT with  current therapist will likely play a larger role in success of patient as compared to further medication adjustments.   Patient's care was discussed during the interdisciplinary team meeting every day during the hospitalization.  The patient denies having side effects to prescribed psychiatric medication.     Principal Problem: Severe episode of recurrent major depressive disorder, without psychotic features [facility]) Discharge Diagnoses: Principal Problem:   Severe episode of recurrent major depressive disorder, without psychotic features [facility])    Past Medical History:  Past Medical History:  Diagnosis Date   Allergic rhinoconjunctivitis 06/06/2015   Allergy  with anaphylaxis due to food 06/06/2015   Asthma    Bupropion  overdose 01/16/2020   Complication of anesthesia    gets disoriented and shakes after surgery   Deliberate self-cutting 10/17/2018   Depression    Eczema    Fracture of 5th metatarsal    Gender dysphoria    Intentional overdose of drug in tablet form (HCC) 04/03/2020   MDD (major depressive disorder), recurrent severe, without psychosis (HCC) 11/02/2021   Moderate episode of recurrent major depressive disorder (HCC) 04/24/2020   Multiple allergies    Severe episode of recurrent major depressive disorder, without psychotic features (HCC) 01/02/2020   Suicidal ideations 08/31/2022   Suicide attempt by cutting of wrist (HCC) 07/28/2022    Past Surgical History:  Procedure Laterality Date   SUPPRELIN  IMPLANT Left 01/22/2019   Procedure: SUPPRELIN  IMPLANT;  Surgeon: Chuckie Casimiro KIDD, MD;  Location: [facility];  Service: Pediatrics;  Laterality: Left;   SUPPRELIN  REMOVAL N/A 03/03/2020   Procedure: SUPPRELIN  REMOVAL;  Surgeon: Chuckie Casimiro KIDD, MD;  Location: [facility];  Service: Pediatrics;  Laterality: N/A;   TONSILLECTOMY     TYMPANOSTOMY TUBE PLACEMENT  Family History:  Family History  Problem Relation Age of Onset    Depression Mother    Anxiety disorder Mother    Hypertension Maternal Grandmother    Allergic rhinitis Neg Hx    Angioedema Neg Hx    Atopy Neg Hx    Eczema Neg Hx    Immunodeficiency Neg Hx    Urticaria Neg Hx     Social History:  Social History   Socioeconomic History   Marital status: Single    Spouse name: Not on file   Number of children: Not on file   Years of education: Not on file   Highest education level: 12th grade  Occupational History   Not on file  Tobacco Use   Smoking status: Never   Smokeless tobacco: Never  Vaping Use   Vaping status: Former   Substances: Nicotine , Flavoring   Devices: Nicotine   Substance and Sexual Activity   Alcohol use: No   Drug use: Not Currently    Types: Marijuana   Sexual activity: Not Currently  Other Topics Concern   Not on file  Social History Narrative   Lives with mom and stepdad.      He is in 12th grade at [employer] 23-24 school year      He enjoys writing and listen to music, writes poetry   Social Drivers of Health   Financial Resource Strain: Patient Declined (08/29/2023)   Overall Financial Resource Strain (CARDIA)    Difficulty of Paying Living Expenses: Patient declined  Food Insecurity: No Food Insecurity (04/29/2024)   Hunger Vital Sign    Worried About Running Out of Food in the Last Year: Never true    Ran Out of Food in the Last Year: Never true  Transportation Needs: No Transportation Needs (04/29/2024)   PRAPARE - Administrator, Civil Service (Medical): No    Lack of Transportation (Non-Medical): No  Physical Activity: Unknown (08/29/2023)   Exercise Vital Sign    Days of Exercise per Week: 0 days    Minutes of Exercise per Session: Not on file  Stress: Stress Concern Present (08/29/2023)   [employer] of Occupational Health - Occupational Stress Questionnaire    Feeling of Stress : Very much  Social Connections: Socially Isolated (08/29/2023)   Social Connection  and Isolation Panel    Frequency of Communication with Friends and Family: Once a week    Frequency of Social Gatherings with Friends and Family: Never    Attends Religious Services: Never    Database administrator or Organizations: No    Attends Engineer, structural: Not on file    Marital Status: Never married  Intimate Partner Violence: Not At Risk (04/29/2024)   Humiliation, Afraid, Rape, and Kick questionnaire    Fear of Current or Ex-Partner: No    Emotionally Abused: No    Physically Abused: No    Sexually Abused: No     Physical Findings: AIMS:  , ,  ,  ,    CIWA:    COWS:     Musculoskeletal: Strength & Muscle Tone: within normal limits Gait & Station: normal Patient leans: N/A  Psychiatric Specialty Exam  Presentation  General Appearance: Appropriate for Environment; Casual  Eye Contact:Good  Speech:Clear and Coherent  Speech Volume:Normal   Mood and Affect  Mood:Euthymic  Affect:Appropriate   Thought Process  Thought Processes:Linear  Descriptions of Associations:Intact  Orientation:Full (Time, Place and Person)  Thought Content:Logical  Hallucinations:No data recorded  Ideas of Reference:None  Suicidal Thoughts:No data recorded Homicidal Thoughts:No data recorded  Sensorium  Memory:Immediate Fair  Judgment:Fair  Insight:Fair   Executive Functions  Concentration:Fair  Attention Span:Fair  Recall:Fair  Fund of Knowledge:Fair  Language:Fair   Psychomotor Activity  Psychomotor Activity:No data recorded  Assets  Assets:Communication Skills; Financial Resources/Insurance; Housing; Resilience; Social Support   Sleep  Sleep:No data recorded  Physical Exam: Physical Exam ROS Blood pressure 111/76, pulse 78, temperature 98.2 F (36.8 C), temperature source Oral, resp. rate 16, height 5' 5 (1.651 m), weight 95.7 kg, SpO2 99%. Body mass index is 35.11 kg/m.  Social History   Tobacco Use  Smoking Status Never   Smokeless Tobacco Never   Tobacco Cessation:  N/A, patient does not currently use tobacco products  Blood Alcohol level:  Lab Results  Component Value Date   [facility] <15 04/29/2024   ETH <15 01/28/2024    Metabolic Disorder Labs:  Lab Results  Component Value Date   HGBA1C 5.1 01/30/2024   MPG 99.67 01/30/2024   MPG 99.67 12/30/2022   Lab Results  Component Value Date   PROLACTIN 10.0 09/01/2022   PROLACTIN 10.3 04/16/2022   Lab Results  Component Value Date   CHOL 212 (H) 01/30/2024   TRIG 105 01/30/2024   HDL 38 (L) 01/30/2024   CHOLHDL 5.6 01/30/2024   VLDL 21 01/30/2024   LDLCALC 153 (H) 01/30/2024   LDLCALC 137 (H) 12/30/2022    See Psychiatric Specialty Exam and Suicide Risk Assessment completed by Attending Physician prior to discharge.  Discharge destination:  Home  Is patient on multiple antipsychotic therapies at discharge:  No   Has Patient had three or more failed trials of antipsychotic monotherapy by history:  No  Recommended Plan for Multiple Antipsychotic Therapies: NA  Discharge Instructions     Diet - low sodium heart healthy   Complete by: As directed    Increase activity slowly   Complete by: As directed       Allergies as of 05/02/2024       Reactions   Egg-derived Products Anaphylaxis   Justicia Adhatoda Anaphylaxis, Other (See Comments)   All TREE NUTS   Peanut-containing Drug Products Anaphylaxis        Medication List     STOP taking these medications    metFORMIN  500 MG tablet Commonly known as: GLUCOPHAGE        TAKE these medications      Indication  albuterol  108 (90 Base) MCG/ACT inhaler Commonly known as: VENTOLIN  HFA Inhale 1-2 puffs into the lungs every 4 (four) hours as needed for wheezing or shortness of breath.  Indication: Asthma   ARIPiprazole  15 MG tablet Commonly known as: ABILIFY  Take 1 tablet (15 mg total) by mouth at bedtime.  Indication: MIXED BIPOLAR AFFECTIVE DISORDER   busPIRone  10 MG  tablet Commonly known as: BUSPAR  Take 1 tablet (10 mg total) by mouth 2 (two) times daily.  Indication: Anxiety Disorder   EPINEPHrine  0.3 mg/0.3 mL Soaj injection Commonly known as: EpiPen  2-Pak Inject 0.3 mg into the muscle as needed for anaphylaxis (AS DIRECTED).  Indication: Life-Threatening Hypersensitivity Reaction   escitalopram  20 MG tablet Commonly known as: LEXAPRO  Take 1 tablet (20 mg total) by mouth daily.  Indication: Major Depressive Disorder   ibuprofen  200 MG tablet Commonly known as: ADVIL  Take 400 mg by mouth every 6 (six) hours as needed for mild pain (pain score 1-3).  Indication: Pain   naltrexone  50 MG tablet Commonly known as: DEPADE  Take 1 tablet (50 mg total) by mouth daily.  Indication: mood stabilization   omeprazole  40 MG capsule Commonly known as: PRILOSEC Take 1 capsule (40 mg total) by mouth in the morning and at bedtime. What changed: when to take this  Indication: Gastroesophageal Reflux Disease   Pataday  0.1 % ophthalmic solution Generic drug: olopatadine  Place 1 drop into both eyes 3 (three) times daily as needed for allergies.  Indication: Inflammation of Eyelid Lining due to Allergy    propranolol  10 MG tablet Commonly known as: INDERAL  Take 1 tablet (10 mg total) by mouth 2 (two) times daily as needed (anxiety). What changed:  when to take this reasons to take this  Indication: Feeling Anxious   Testosterone  Cypionate 200 MG/ML Soln Inject weekly under the skin as directed What changed:  how to take this when to take this additional instructions  Indication: Transgender Man   traZODone  50 MG tablet Commonly known as: DESYREL  Take 1-2 tablets (50-100 mg total) by mouth at bedtime as needed for sleep.  Indication: Trouble Sleeping   triamcinolone  ointment 0.1 % Commonly known as: KENALOG  APPLY TOPICALLY 2 (TWO) TIMES DAILY AS NEEDED (ECZEMA).  Indication: Skin Inflammation        Follow-up Information      [facility] Follow up on 05/04/2024.   Why: Please call your provider on 05/04/24 at 9:00 am to schedule an appointment for therapy services, as we were unable to contact prior to your discharge. Contact information: [address] [place] [address] [phone]         [facility] Follow up on 05/25/2024.   Specialty: Behavioral Health Why: You have an appointment on 05/25/24 at 11:00 am for medication management services. Contact information: [address] [place]  [address] [phone]                 Follow-up recommendations:   Activity:  as tolerated Diet:  heart healthy   Comments:  Prescriptions were given at discharge.  Patient is agreeable with the discharge plan.  Patient was given an opportunity to ask questions.  Patient appears to feel comfortable with discharge and denies any current suicidal or homicidal thoughts.    Patient is instructed prior to discharge to: Take all medications as prescribed by mental healthcare provider. Report any adverse effects and or reactions from the medicines to outpatient provider promptly. In the event of worsening symptoms, patient is instructed to call the crisis hotline, 911 and or go to the nearest ED for appropriate evaluation and treatment of symptoms. Patient is to follow-up with primary care provider for other medical issues, concerns and or health care needs.     Signed: Prentice Espy, MD 05/02/2024, 8:08 AM

## 2024-05-02 NOTE — BHH Suicide Risk Assessment (Signed)
 [facility] Discharge Suicide Risk Assessment   Principal Problem: Severe episode of recurrent major depressive disorder, without psychotic features (HCC) Discharge Diagnoses: Principal Problem:   Severe episode of recurrent major depressive disorder, without psychotic features (HCC)   Reason for Admission: Antonio [name] is a 18 y.o., adult with history of ASD level 1, social anxiety disorder, major depressive disorder with multiple psychiatric hospitalizations in the context of suicide by cutting/OD/swallowing blade, borderline personality traits as well as self-mutilating behaviors presents to [facility] due to self-injurious behavior. Patient reports medication compliance with Lexapro  20, Abilify  15, esketamine 84 mg weekly, trazodone  as needed, BuSpar  10 mg twice daily, propranolol  10 mg daily, naltrexone  50 mg daily (admitted on 04/29/2024, total  LOS: 2 days ).   Hospital Course Psychiatric diagnoses provided upon initial assessment:  #Major depressive disorder, recurrent, severe, without psychotic features, in current episode #Generalized anxiety disorder #Social anxiety disorder #Autism spectrum disorder (level 1) assistance #Borderline personality traits  Patient's psychiatric medications were adjusted on admission:  -Changed propranolol  to 10 mg bid prn for social anxiety -stopped metformin  as patient does not have diabetes nor on an antipsychotic that is known to cause severe weight gain -Otherwise continued home medications of Abilify  15 mg, lexapro  20 mg, buspirone  10 mg twice daily, and naltrexone  50 mg.   Patient's chief complaints and NSSIB appear to stem from lack of socialization and structure. Patient's frequent hospitalization appear to be a form of reassurance and safety for the patient. Encouraged patient to identify the aspects that patient finds reassuring and supportive in the hospital and identify ways this can be emulated outpatient. DBT with current therapist will likely play a  larger role in success of patient as compared to further medication adjustments.   Patient's care was discussed during the interdisciplinary team meeting every day during the hospitalization.  The patient denies having side effects to prescribed psychiatric medication.    Total Time spent with patient: 1 hour  Musculoskeletal: Strength & Muscle Tone: within normal limits Gait & Station: normal Patient leans: N/A  Psychiatric Specialty Exam  Presentation  General Appearance: Appropriate for Environment; Casual   Eye Contact:Good   Speech:Clear and Coherent   Speech Volume:Normal     Mood and Affect  Mood:Euthymic   Affect:Appropriate    Thought Process  Thought Processes:Linear   Descriptions of Associations:Intact   Orientation:Full (Time, Place and Person)   Thought Content:Logical   Hallucinations:No data recorded Ideas of Reference:None   Suicidal Thoughts:No data recorded Homicidal Thoughts:No data recorded  Sensorium  Memory:Immediate Fair   Judgment:Fair   Insight:Fair    Executive Functions  Concentration:Fair   Attention Span:Fair   Recall:Fair   Fund of Knowledge:Fair   Language:Fair    Psychomotor Activity  Psychomotor Activity:No data recorded  Assets  Assets:Communication Skills; Financial Resources/Insurance; Housing; Resilience; Social Support    Sleep  Sleep:No data recorded  Blood pressure 111/76, pulse 78, temperature 98.2 F (36.8 C), temperature source Oral, resp. rate 16, height 5' 5 (1.651 m), weight 95.7 kg, SpO2 99%. Body mass index is 35.11 kg/m.  Mental Status Per Nursing Assessment::   On Admission:  Suicidal ideation indicated by patient  Demographic Factors:  Male and Unemployed  Loss Factors: NA  Historical Factors: Prior suicide attempts and Impulsivity  Risk Reduction Factors:   Living with another person, especially a relative, Positive social support, Positive  therapeutic relationship, and Positive coping skills or problem solving skills  Continued Clinical Symptoms:  Severe Anxiety and/or Agitation Depression:  Severe Personality Disorders:   Cluster B More than one psychiatric diagnosis Previous Psychiatric Diagnoses and Treatments  Cognitive Features That Contribute To Risk:  None    Suicide Risk:  Mild:  Suicidal ideation of limited frequency, intensity, duration, and specificity.  There are no identifiable plans, no associated intent, mild dysphoria and related symptoms, good self-control (both objective and subjective assessment), few other risk factors, and identifiable protective factors, including available and accessible social support.   Follow-up Information     [facility] Follow up on 05/04/2024.   Why: Please call your provider on 05/04/24 at 9:00 am to schedule an appointment for therapy services, as we were unable to contact prior to your discharge. Contact information: [address] [place] [address] [phone]         [facility] Follow up on 05/25/2024.   Specialty: Behavioral Health Why: You have an appointment on 05/25/24 at 11:00 am for medication management services. Contact information: [address] [place]  [address] [phone]                Follow-up recommendations:   Activity:  as tolerated Diet:  heart healthy   Comments:  Prescriptions were given at discharge.  Patient is agreeable with the discharge plan.  Patient was given an opportunity to ask questions.  Patient appears to feel comfortable with discharge and denies any current suicidal or homicidal thoughts.    Patient is instructed prior to discharge to: Take all medications as prescribed by mental healthcare provider. Report any adverse effects and or reactions from the medicines to outpatient provider promptly. In the event of worsening symptoms, patient  is instructed to call the crisis hotline, 911 and or go to the nearest ED for appropriate evaluation and treatment of symptoms. Patient is to follow-up with primary care provider for other medical issues, concerns and or health care needs.    Prentice Espy, MD 05/02/2024, 8:07 AM

## 2024-05-02 NOTE — Progress Notes (Signed)
 Pt awake and alert at time of discharge. AVS and all discharge instructions given to pt including pharmacy pick up, medication adm and follow up appointment schedule.  Pt verbalized understanding.   All belongings from locker 4 returned to pt along with CPAP machine. Pt voiced no concerns and was escorted to lobby. Pt left with mother without incident.

## 2024-05-02 NOTE — Progress Notes (Signed)
  [facility] Adult Case Management Discharge Plan :  Will you be returning to the same living situation after discharge:  Yes,  returning home with mom, address on file.  At discharge, do you have transportation home?: Yes,  mother will be picking pt up at 2 PM.  Do you have the ability to pay for your medications: Yes,  pt has active health insurance.  Release of information consent forms completed and in the chart;  Patient's signature needed at discharge.  Patient to Follow up at:  Follow-up Information     [facility] Follow up on 05/04/2024.   Why: Please call your provider on 05/04/24 at 9:00 am to schedule an appointment for therapy services, as we were unable to contact prior to your discharge. Contact information: [address] [place] [address] [phone]         [facility] Follow up on 05/25/2024.   Specialty: Behavioral Health Why: You have an appointment on 05/25/24 at 11:00 am for medication management services. Contact information: [address] [place]  [address] [phone]                Next level of care provider has access to [facility] Link:no  Safety Planning and Suicide Prevention discussed: Yes,  completed with mother, [name] [phone]     Has patient been referred to the Quitline?: Patient does not use tobacco/nicotine  products  Patient has been referred for addiction treatment: No known substance use disorder.  Louetta Lame, LCSWA 05/02/2024, 9:38 AM

## 2024-05-02 NOTE — Hospital Course (Signed)
 Reason for Admission: Antonio [name] is a 18 y.o., adult with history of ASD level 1, social anxiety disorder, major depressive disorder with multiple psychiatric hospitalizations in the context of suicide by cutting/OD/swallowing blade, borderline personality traits as well as self-mutilating behaviors presents to [facility] due to self-injurious behavior. Patient reports medication compliance with Lexapro  20, Abilify  15, esketamine 84 mg weekly, trazodone  as needed, BuSpar  10 mg twice daily, propranolol  10 mg daily, naltrexone  50 mg daily (admitted on 04/29/2024, total  LOS: 2 days ).   Hospital Course Psychiatric diagnoses provided upon initial assessment:  #Major depressive disorder, recurrent, severe, without psychotic features, in current episode #Generalized anxiety disorder #Social anxiety disorder #Autism spectrum disorder (level 1) assistance #Borderline personality traits  Patient's psychiatric medications were adjusted on admission:  -Changed propranolol  to 10 mg bid prn for social anxiety -stopped metformin  as patient does not have diabetes nor on an antipsychotic that is known to cause severe weight gain -Otherwise continued home medications of Abilify  15 mg, lexapro  20 mg, buspirone  10 mg twice daily, and naltrexone  50 mg.   Patient's chief complaints and NSSIB appear to stem from lack of socialization and structure. Patient's frequent hospitalization appear to be a form of reassurance and safety for the patient. Encouraged patient to identify the aspects that patient finds reassuring and supportive in the hospital and identify ways this can be emulated outpatient. DBT with current therapist will likely play a larger role in success of patient as compared to further medication adjustments.   Patient's care was discussed during the interdisciplinary team meeting every day during the hospitalization.  The patient denies having side effects to prescribed psychiatric medication.

## 2024-05-03 ENCOUNTER — Encounter (INDEPENDENT_AMBULATORY_CARE_PROVIDER_SITE_OTHER): Payer: Self-pay

## 2024-05-08 ENCOUNTER — Ambulatory Visit (INDEPENDENT_AMBULATORY_CARE_PROVIDER_SITE_OTHER): Admitting: Psychiatry

## 2024-05-08 DIAGNOSIS — F332 Major depressive disorder, recurrent severe without psychotic features: Secondary | ICD-10-CM | POA: Diagnosis not present

## 2024-05-08 DIAGNOSIS — F840 Autistic disorder: Secondary | ICD-10-CM | POA: Diagnosis not present

## 2024-05-08 DIAGNOSIS — F649 Gender identity disorder, unspecified: Secondary | ICD-10-CM | POA: Diagnosis not present

## 2024-05-08 MED ORDER — RAMELTEON 8 MG PO TABS: 8.0000 mg | ORAL_TABLET | Freq: Every day | ORAL | 1 refills | Status: DC

## 2024-05-08 MED ORDER — CARIPRAZINE HCL 1.5 MG PO CAPS: 1.5000 mg | ORAL_CAPSULE | Freq: Every day | ORAL | 1 refills | Status: DC

## 2024-05-09 ENCOUNTER — Encounter: Payer: Self-pay | Admitting: Psychiatry

## 2024-05-09 ENCOUNTER — Other Ambulatory Visit: Payer: Self-pay | Admitting: Family Medicine

## 2024-05-09 NOTE — Progress Notes (Signed)
 [facility] [address], [place]   Follow-up visit  Date of Service: 05/08/2024  CC/Purpose: Routine medication management follow up.    Antonio [name] is a 18 y.o. adult with a past psychiatric history of depression, anxiety, ASD who presents today for a psychiatric follow up appointment.    The patient was last seen on 04/27/24, at which time the following plan was established:  Medication management:             - Continue Abilify  15mg  daily             - Continue Lexapro  20mg  daily             - Continue propranolol  10mg  at bedtime             - Continue Buspar  10mg  BID             - Continue trazodone              - Continue Metformin  500mg  BID             - Naltrexone  50mg  daily _______________________________________________________________________________________ Acute events/encounters since last visit: Hospitalized for 2 days for SI    Antonio [name] presents to clinic with his mother. They were interviewed together and Antonio [name] alone. They note that Antonio [name] was in the hospital recently for suicidal ideation. He has been out for about a week or so now. He feels that he is doing a little better recently since being out. He feels his mood is improving some. His mother still sees a lot of depressive symptoms, including him sleeping a lot. He denies any desire to die or plans to harm himself at this time. Discussed his medicine regimen. They are okay with trying a new medicine for mood, as he has been on Abilify  for a long time. He denies other concerns today. No HI/AVH.    Sleep: stable Appetite: Decreased Depression: see HPI Bipolar symptoms:  denies Current suicidal/homicidal ideations:  passive thoughts Current auditory/visual hallucinations:  denied    Non-Suicidal Self-Injury: habitual self harm for years Suicide Attempt History: at least 6 times - overdoses, cutting, swallowed razor blade  Psychotherapy: Gray[facility] P[facility] Previous psychiatric  medication trials:  multiple: Zoloft  150mg , Pristiq  50mg , Prozac  20mg , Wellbutrin  XL 150mg , Effexor  XR 225mg , Intuniv  2mg , Seroquel  75mg , Latuda  40mg , Lamictal  - unclear dose.      Not in work or in school at this time Current Living Situation (including members of house hold): lives with mom and step dad - sees bio dad some     Allergies  Allergen Reactions   Egg-Derived Products Anaphylaxis   Justicia Adhatoda Anaphylaxis and Other (See Comments)    All TREE NUTS   Peanut-Containing Drug Products Anaphylaxis      Labs:  reviewed  Medical diagnoses: Patient Active Problem List   Diagnosis Date Noted   Severe episode of recurrent major depressive disorder, without psychotic features (HCC) 04/29/2024   Suicide attempt (HCC) 11/14/2023   Obesity (BMI 30-39.9) 09/01/2023   Borderline personality disorder (HCC) 08/19/2023   Recurrent major depressive disorder (HCC) 08/19/2023   Daytime somnolence 05/26/2023   Transgender man on hormone therapy 05/26/2023   Suicidal ideations 02/09/2023   MDD (major depressive disorder), recurrent severe, without psychosis (HCC) 01/02/2023   Suicide attempt by cutting of wrist (HCC) 07/28/2022   Autism spectrum disorder requiring support (level 1) 05/01/2021   Acne vulgaris 07/31/2020   Gastroesophageal reflux disease 09/27/2019   Insulin  resistance  10/17/2018   Deliberate self-cutting 10/17/2018   Social anxiety disorder 10/17/2018   Gender dysphoria    Intrinsic atopic dermatitis 06/06/2015   Asthma 08/23/2011    Psychiatric Specialty Exam: There were no vitals taken for this visit.There is no height or weight on file to calculate BMI.  General Appearance: Neat and Well Groomed  Eye Contact:  Good  Speech:  Clear and Coherent  Mood:  Dysphoric  Affect:  Appropriate  Thought Process:  Goal Directed  Orientation:  Full (Time, Place, and Person)  Thought Content:  Logical  Suicidal Thoughts:  No  Homicidal Thoughts:  No  Memory:   Immediate;   Good  Judgement:  Fair  Insight:  Fair  Psychomotor Activity:  Normal  Concentration:  Concentration: Good  Recall:  Good  Fund of Knowledge:  Good  Language:  Good  Assets:  Communication Skills Desire for Improvement Financial Resources/Insurance Housing Leisure Time Physical Health Social Support Talents/Skills Transportation  Cognition:  WNL      Assessment   Psychiatric Diagnoses:   ICD-10-CM   1. Autism spectrum disorder requiring support (level 1)  F84.0     2. Gender dysphoria  F64.9     3. MDD (major depressive disorder), recurrent severe, without psychosis (HCC)  F33.2       Patient complexity: Moderate   Patient Education and Counseling:  Supportive therapy provided for identified psychosocial stressors.  Medication education provided and decisions regarding medication regimen discussed with patient/guardian.   On assessment today, Antonio [name] had another hospitalization for suicidal ideation. He has a long history of hospitalizations for suicidal thoughts, which I feel reflects some of his borderline traits. He does endorse some continued depressive symptoms, and has behavioral changes consistent with this. I do feel that he has depression, and feel that it is worth trying a new medicine to see if there is further benefit to be had. He will remain at a chronically elevated risk for suicidal thoughts, self harm, and hospital stays. We will limit this risk as much as we are able. I will restart Metformin , as Abilify  has caused weight gain for him. We will determine the need for this is Vraylar  is effective. No AVH/HI.    Plan  Medication management:             - Decrease Abilify  to 7.5mg  daily for one week then stop this  - Start Vraylar  1.5mg  daily             - Continue Lexapro  20mg  daily             - Continue propranolol  10mg  at bedtime             - Continue Buspar  10mg  BID             - Stop trazodone   - Start Ramelteon  8mg  at bedtime               - Restart Metformin  500mg  BID  - Naltrexone  50mg  daily  Labs/Studies:  - reviewed  Additional recommendations:  - Continue with current therapist, Crisis plan reviewed and patient verbally contracts for safety. Go to ED with emergent symptoms or safety concerns, and Risks, benefits, side effects of medications, including any / all black box warnings, discussed with patient, who verbalizes their understanding   Follow Up: Return in 1 month - Call in the interim for any side-effects, decompensation, questions, or problems between now and the next visit.   I have spent 30 minutes reviewing  the patients chart, meeting with the patient and family, and reviewing medicines and side effects.   Selinda GORMAN Lauth, MD [facility]

## 2024-05-13 ENCOUNTER — Other Ambulatory Visit: Payer: Self-pay | Admitting: Family Medicine

## 2024-05-14 NOTE — Progress Notes (Signed)
 HPI M followed for OSA, complicated by F2M Transgender, Autism, Depression, Hx Suicide Attempt/ Self Harm, Allergic Rhinitis, Asthma, Food Allergy  GERD Followed by Allergy  and Behavioral Health. HST 01/02/24- ([facility])- AHI 11.3/hr, desat to 84%, body weight 235 lbs  =============================================================================================    02/09/24-[name] courtesy of Dr Camie Mulch with concern of hypersomnolence, complicated by F2M Transgender, Autism, Depression, Hx Suicide Attempt/ Self Harm, Allergic Rhinitis, Asthma, Food Allergy  GERD Followed by Allergy  and Behavioral Health. HST 01/02/24- ([facility])- AHI 11.3/hr, desat to 84%, body weight 235 lbs                          Mother here today -Trazodone  50-100 HS,  also Abilify , Naltrexone  Body weight today-223 lbs For treatment decision [facility] dc 5/21. Discussed the use of AI scribe software for clinical note transcription with the patient, who gave verbal consent to proceed.  History of Present Illness   Antonio [name] is a 18 year old male with sleep apnea who presents with difficulty falling and staying asleep. He is accompanied by his mother.  He experiences ongoing difficulty with falling and staying asleep despite using trazodone , which he feels is insufficient. He has daytime fatigue and does not feel rested after sleep. A recent sleep study confirms mild sleep apnea with approximately eleven apneic episodes per hour. Weight gain and sleeping position are noted to exacerbate his condition. We discussed  treatments for OSA, noting that his meds and other factors may contribute to sleep and fatigue issues. We can see if treating OSA makes a useful difference.     Assessment and Plan:    Obstructive Sleep Apnea, Mild Mild obstructive sleep apnea with 11 apneic events per hour due to mechanical airway obstruction. Discussed CPAP and oral appliances as treatment options. - Refer to Dr. Oneil Forget for oral appliance  evaluation. - Order CPAP setup through home care company. - Informed him about costs and logistics of CPAP and oral appliances. - Advised monitoring for contact from referral sources and follow up if not contacted within two weeks. - Discussed importance of weight management and sleeping position.      05/15/24- [name] courtesy of Dr Camie Mulch with concern of hypersomnolence, complicated by F2M Transgender, Autism, Depression, Hx Suicide Attempt/ Self Harm, Allergic Rhinitis, Asthma, Food Allergy  GERD Followed by Allergy  and Behavioral Health. HST 01/02/24- ([facility])- AHI 11.3/hr, desat to 84%, body weight 235 lbs                      -Trazodone  50-100 HS,  also Abilify , Naltrexone  CPAP auto 5-15/ [employer]   new order 02/09/24 Body weight today- Download compliance-67%, AHI 1.3/hr Discussed the use of AI scribe software for clinical note transcription with the patient, who gave verbal consent to proceed.  History of Present Illness   Antonio [name] is a 18 year old male who presents for follow-up regarding CPAP use for sleep apnea. Mother is here.  He experiences significant improvement in sleep quality and daytime restfulness with CPAP use. The CPAP machine is set between five and fifteen centimeters of water pressure, with typical usage between eight and nine centimeters. He averages less than two breakthrough apneas per hour. There are nights when he does not use the CPAP for at least four hours. He does not find the CPAP uncomfortable and does not feel an urge to remove it during the night. Occasionally, he falls asleep  without it, particularly on the sofa or after using the bathroom, but he is working on wearing it consistently during all sleep periods, including naps.     Assessment and Plan:    Obstructive sleep apnea Obstructive sleep apnea well-managed with CPAP therapy. Apneas reduced to less than 2 per hour with CPAP set primarily between 8-9 cm H2O. - Encouraged CPAP use for at least  four hours per night, including naps. - Scheduled follow-up in four months. - Advised contacting home care company for mechanical issues or mask adjustments.   Obesity -effort encouraged   Anxiety/ Depression managed elsewhere     ROS-see HPI   + = positive Constitutional:    +weight gain, night sweats, fevers, chills, fatigue, lassitude. HEENT:    +headaches, difficulty swallowing, tooth/dental problems, sore throat,       sneezing, itching, ear ache, nasal congestion, post nasal drip, snoring CV:    chest pain, orthopnea, PND, swelling in lower extremities, anasarca,                                   dizziness, palpitations Resp:   +shortness of breath with exertion or at rest.                productive cough,   non-productive cough, coughing up of blood.              change in color of mucus.  wheezing.   Skin:    rash or lesions. GI: +heartburn, indigestion, +abdominal pain, nausea, vomiting, diarrhea,                 change in bowel habits, loss of appetite GU: dysuria, change in color of urine, no urgency or frequency.   flank pain. MS:   +joint pain, stiffness, decreased range of motion, back pain. Neuro-     nothing unusual Psych:  change in mood or affect.  +depression or anxiety.   memory loss.  OBJ- Physical Exam General- Alert, Oriented, Affect-appropriate, Distress- none acute, +obese Skin- rash-none, lesions- none, excoriation- none, + piercings Lymphadenopathy- none Head- atraumatic            Eyes- Gross vision intact, PERRLA, conjunctivae and secretions clear            Ears- Hearing, canals-normal            Nose- Clear, no-Septal dev, mucus, polyps, erosion, perforation             Throat- Mallampati IV , mucosa clear , drainage- none, tonsils- atrophic, +teeth Neck- flexible , trachea midline, no stridor , thyroid  nl, carotid no bruit Chest - symmetrical excursion , unlabored           Heart/CV- RRR , no murmur , no gallop  , no rub, nl s1 s2                            - JVD- none , edema- none, stasis changes- none, varices- none           Lung- clear to P&A, wheeze- none, cough- none , dullness-none, rub- none           Chest wall-  Abd-  Br/ Gen/ Rectal- Not done, not indicated Extrem- cyanosis- none, clubbing, none, atrophy- none, strength- nl Neuro- grossly intact to observation

## 2024-05-15 ENCOUNTER — Encounter: Payer: Self-pay | Admitting: Internal Medicine

## 2024-05-15 ENCOUNTER — Ambulatory Visit (INDEPENDENT_AMBULATORY_CARE_PROVIDER_SITE_OTHER): Admitting: Internal Medicine

## 2024-05-15 VITALS — BP 97/61 | HR 75

## 2024-05-15 DIAGNOSIS — G4733 Obstructive sleep apnea (adult) (pediatric): Secondary | ICD-10-CM

## 2024-05-15 NOTE — Patient Instructions (Signed)
 You are off to a really good start with CPAP. See if you can get into the habit of wearing it whenever you sleep- our goal is at least 4 hours/ night on at least 6 nights per week.

## 2024-05-16 ENCOUNTER — Other Ambulatory Visit: Payer: Self-pay | Admitting: Family Medicine

## 2024-05-19 ENCOUNTER — Encounter: Payer: Self-pay | Admitting: Internal Medicine

## 2024-05-24 ENCOUNTER — Encounter: Payer: Self-pay | Admitting: Allergy

## 2024-05-24 ENCOUNTER — Ambulatory Visit: Admitting: Allergy

## 2024-05-24 ENCOUNTER — Other Ambulatory Visit: Payer: Self-pay

## 2024-05-24 VITALS — BP 110/78 | HR 88 | Temp 97.7°F | Resp 16 | Ht 65.0 in | Wt 214.4 lb

## 2024-05-24 DIAGNOSIS — H1013 Acute atopic conjunctivitis, bilateral: Secondary | ICD-10-CM | POA: Diagnosis not present

## 2024-05-24 DIAGNOSIS — J4530 Mild persistent asthma, uncomplicated: Secondary | ICD-10-CM

## 2024-05-24 DIAGNOSIS — T7800XD Anaphylactic reaction due to unspecified food, subsequent encounter: Secondary | ICD-10-CM | POA: Diagnosis not present

## 2024-05-24 DIAGNOSIS — J3089 Other allergic rhinitis: Secondary | ICD-10-CM | POA: Diagnosis not present

## 2024-05-24 DIAGNOSIS — K200 Eosinophilic esophagitis: Secondary | ICD-10-CM

## 2024-05-24 MED ORDER — PATADAY 0.1 % OP SOLN: 1.0000 [drp] | OPHTHALMIC | 11 refills | Status: AC | PRN

## 2024-05-24 MED ORDER — OMEPRAZOLE 40 MG PO CPDR: 40.0000 mg | DELAYED_RELEASE_CAPSULE | Freq: Two times a day (BID) | ORAL | 11 refills | Status: AC

## 2024-05-24 MED ORDER — NEFFY 2 MG/0.1ML NA SOLN: 1.0000 | NASAL | 1 refills | Status: AC | PRN

## 2024-05-24 MED ORDER — ALBUTEROL SULFATE HFA 108 (90 BASE) MCG/ACT IN AERS: 1.0000 | INHALATION_SPRAY | RESPIRATORY_TRACT | 1 refills | Status: AC | PRN

## 2024-05-24 MED ORDER — LORATADINE 10 MG PO TABS: 10.0000 mg | ORAL_TABLET | Freq: Every day | ORAL | 11 refills | Status: AC | PRN

## 2024-05-24 MED ORDER — MOMETASONE FUROATE 50 MCG/ACT NA SUSP: 1.0000 | Freq: Every day | NASAL | 11 refills | Status: AC | PRN

## 2024-05-24 NOTE — Patient Instructions (Addendum)
 Asthma:  - doing well without symptoms - have access to albuterol  inhaler 2 puffs every 4-6 hours as needed for cough/wheeze/shortness of breath/chest tightness.  May use 15-20 minutes prior to activity.   Monitor frequency of use.   - lung function study remains normal!!  Asthma control goals:  Full participation in all desired activities (may need albuterol  before activity) Albuterol  use two time or less a week on average (not counting use with activity) Cough interfering with sleep two time or less a month Oral steroids no more than once a year No hospitalizations  Allergies:  - Claritin  10mg  daily as needed for nasal symptoms/hives/itch  If Claritin  becomes less effective can use either Zyrtec , Allegra  or Xyzal   - use Nasonex  1-2 sprays daily for congestion or drainge as needed.  Use for 1-2 weeks at a time before stopping once symptoms improve  - use pataday  1 drop daily as needed for watery/itchy/red eyes  Food allergy : - continue avoidance of peanut, tree nut and egg - have access to Epipen  0.3mg  OR Neffy  nasal epinephrine  at all times.     Neffy  goes to [facility] specialty pharmacy in [place]  and they will reach out to you regarding prescription and coverage.   - follow emergency action plan in case of reaction - will update IgE levels for food allergens.  Labs have been order and you can return to [place] during office hours (after 9am) for lab draw when convenient for you or go to any [facility] facility for lab draw  EOE: - continues to be PPI (omeprazole )-responsive EOE - we have previously discussed therapies for EOE which can include PPI, dietary modifications, swallowed steroids and Dupixent injections  - tolerating tomato products in diet now thus continue - continue omeprazole  daily - follow up with GI doctor ( Dr Ernesto) as scheduled for monitoring. - let me know if you start to have more swallowing difficulties  Follow-up 1 year or sooner if needed

## 2024-05-24 NOTE — Progress Notes (Signed)
 Follow-up Note  RE: Antonio [name] MRN: [id number] DOB: 10-Feb-2006 Date of Office Visit: 05/24/2024   History of present illness: Antonio [name] is a 18 y.o. adult presenting today for follow-up of history of asthma, allergic rhinitis with conjunctivitis, food allergy , EOE.  He was last in the office on 06/08/2023 by myself. Discussed the use of AI scribe software for clinical note transcription with the patient, who gave verbal consent to proceed.  He has not needed to use his rescue inhaler in the past year.  Regarding allergies, he takes allergy  medication as needed and reports infrequent use. He has not used nasal sprays or eye drops recently and has not needed his epinephrine  device.  In terms of food allergies, he avoids certain foods, peanuts, tree nuts and eggs, and is interested in updating his allergy  testing to know his numbers. He has not had any accidental ingestions this year.  For eosinophilic esophagitis, he continues to take omeprazole  and reports no issues with swallowing. He has not seen his GI doctor recently but notes a decrease in appetite with plans to make an appointment to discuss this.     Review of systems: 10pt ROS negative unless noted above in HPI   Past medical/social/surgical/family history have been reviewed and are unchanged unless specifically indicated below.  No changes  Medication List: Current Outpatient Medications  Medication Sig Dispense Refill   albuterol  (VENTOLIN  HFA) 108 (90 Base) MCG/ACT inhaler Inhale 1-2 puffs into the lungs every 4 (four) hours as needed for wheezing or shortness of breath. 18 g 1   busPIRone  (BUSPAR ) 10 MG tablet Take 1 tablet (10 mg total) by mouth 2 (two) times daily. 180 tablet 1   cariprazine  (VRAYLAR ) 1.5 MG capsule Take 1 capsule (1.5 mg total) by mouth daily. 30 capsule 1   EPINEPHrine  (EPIPEN  2-PAK) 0.3 mg/0.3 mL IJ SOAJ injection Inject 0.3 mg into the muscle as needed for anaphylaxis (AS DIRECTED). 2 each  1   escitalopram  (LEXAPRO ) 20 MG tablet Take 1 tablet (20 mg total) by mouth daily. 90 tablet 1   ibuprofen  (ADVIL ) 200 MG tablet Take 400 mg by mouth every 6 (six) hours as needed for mild pain (pain score 1-3).     naltrexone  (DEPADE) 50 MG tablet Take 1 tablet (50 mg total) by mouth daily. 90 tablet 1   omeprazole  (PRILOSEC) 40 MG capsule Take 1 capsule (40 mg total) by mouth in the morning and at bedtime. (Patient taking differently: Take 40 mg by mouth daily.) 180 capsule 1   PATADAY  0.1 % ophthalmic solution Place 1 drop into both eyes 3 (three) times daily as needed for allergies.     propranolol  (INDERAL ) 10 MG tablet Take 1 tablet (10 mg total) by mouth 2 (two) times daily as needed (anxiety). 60 tablet 0   ramelteon  (ROZEREM ) 8 MG tablet Take 1 tablet (8 mg total) by mouth at bedtime. 30 tablet 1   testosterone  cypionate (DEPOTESTOSTERONE CYPIONATE) 200 MG/ML injection INJECT 80MG  (0.4ML) INTO THE SKIN EVERY SUNDAY. DISCARD REMAINDER OF VIAL AFTER USE, NO PRESERVATIVE 4 mL 1   triamcinolone  ointment (KENALOG ) 0.1 % APPLY TOPICALLY 2 (TWO) TIMES DAILY AS NEEDED (ECZEMA). 30 g 0   ARIPiprazole  (ABILIFY ) 15 MG tablet Take 1 tablet (15 mg total) by mouth at bedtime. (Patient not taking: Reported on 05/24/2024) 90 tablet 1   traZODone  (DESYREL ) 50 MG tablet Take 1-2 tablets (50-100 mg total) by mouth at bedtime as needed for sleep. (Patient not  taking: Reported on 05/24/2024) 30 tablet 0   No current facility-administered medications for this visit.     Known medication allergies: Allergies  Allergen Reactions   Egg-Derived Products Anaphylaxis   Justicia Adhatoda Anaphylaxis and Other (See Comments)    All TREE NUTS   Peanut-Containing Drug Products Anaphylaxis     Physical examination: Blood pressure 110/78, pulse 88, temperature 97.7 F (36.5 C), temperature source Temporal, resp. rate 16, height 5' 5 (1.651 m), weight 214 lb 6.4 oz (97.3 kg), SpO2 97%.  General: Alert,  interactive, in no acute distress. HEENT: PERRLA, TMs pearly gray, turbinates non-edematous without discharge, post-pharynx non erythematous. Neck: Supple without lymphadenopathy. Lungs: Clear to auscultation without wheezing, rhonchi or rales. {no increased work of breathing. CV: Normal S1, S2 without murmurs. Abdomen: Nondistended, nontender. Skin: Warm and dry, without lesions or rashes. Extremities:  No clubbing, cyanosis or edema. Neuro:   Grossly intact.  Diagnostics/Labs:  Spirometry: FEV1: 3.19L 107%, FVC: 4.27L 127%, ratio consistent with nonobstructive pattern  Assessment and plan:   Asthma:  - doing well without symptoms - have access to albuterol  inhaler 2 puffs every 4-6 hours as needed for cough/wheeze/shortness of breath/chest tightness.  May use 15-20 minutes prior to activity.   Monitor frequency of use.   - lung function study remains normal!!  Asthma control goals:  Full participation in all desired activities (may need albuterol  before activity) Albuterol  use two time or less a week on average (not counting use with activity) Cough interfering with sleep two time or less a month Oral steroids no more than once a year No hospitalizations  Allergies:  - Claritin  10mg  daily as needed for nasal symptoms/hives/itch  If Claritin  becomes less effective can use either Zyrtec , Allegra  or Xyzal   - use Nasonex  1-2 sprays daily for congestion or drainge as needed.  Use for 1-2 weeks at a time before stopping once symptoms improve  - use pataday  1 drop daily as needed for watery/itchy/red eyes  Food allergy : - continue avoidance of peanut, tree nut and egg - have access to Epipen  0.3mg  OR Neffy  nasal epinephrine  at all times.     Neffy  goes to [facility] specialty pharmacy in [place]  and they will reach out to you regarding prescription and coverage.   - follow emergency action plan in case of reaction - will update IgE levels for food allergens.  Labs have been order and you  can return to [place] during office hours (after 9am) for lab draw when convenient for you or go to any [facility] facility for lab draw  EOE: - continues to be PPI (omeprazole )-responsive EOE - we have previously discussed therapies for EOE which can include PPI, dietary modifications, swallowed steroids and Dupixent injections  - tolerating tomato products in diet now thus continue - continue omeprazole  daily - follow up with GI doctor ( Dr Ernesto) as scheduled for monitoring. - let me know if you start to have more swallowing difficulties  Follow-up 1 year or sooner if needed  I appreciate the opportunity to take part in Antonio [name]'s care. Please do not hesitate to contact me with questions.  Sincerely,   Danita Brain, MD Allergy /Immunology [facility]

## 2024-05-25 ENCOUNTER — Ambulatory Visit: Admitting: Psychiatry

## 2024-05-30 ENCOUNTER — Encounter: Payer: Self-pay | Admitting: Family Medicine

## 2024-05-30 ENCOUNTER — Ambulatory Visit (INDEPENDENT_AMBULATORY_CARE_PROVIDER_SITE_OTHER): Payer: Self-pay | Admitting: Family Medicine

## 2024-05-30 VITALS — BP 110/73 | HR 72 | Wt 215.8 lb

## 2024-05-30 DIAGNOSIS — M222X1 Patellofemoral disorders, right knee: Secondary | ICD-10-CM | POA: Diagnosis not present

## 2024-05-30 DIAGNOSIS — Z79899 Other long term (current) drug therapy: Secondary | ICD-10-CM

## 2024-05-30 DIAGNOSIS — Z5181 Encounter for therapeutic drug level monitoring: Secondary | ICD-10-CM | POA: Diagnosis not present

## 2024-05-30 DIAGNOSIS — E88819 Insulin resistance, unspecified: Secondary | ICD-10-CM

## 2024-05-30 DIAGNOSIS — F640 Transsexualism: Secondary | ICD-10-CM | POA: Diagnosis not present

## 2024-05-30 DIAGNOSIS — L309 Dermatitis, unspecified: Secondary | ICD-10-CM

## 2024-05-30 MED ORDER — TRIAMCINOLONE ACETONIDE 0.1 % EX OINT: TOPICAL_OINTMENT | CUTANEOUS | 3 refills | Status: AC

## 2024-05-30 MED ORDER — TESTOSTERONE CYPIONATE 200 MG/ML IM SOLN: INTRAMUSCULAR | 1 refills | Status: DC

## 2024-05-30 NOTE — Patient Instructions (Signed)
Let me see you in 3 months! 

## 2024-05-31 ENCOUNTER — Other Ambulatory Visit: Payer: Self-pay | Admitting: Psychiatry

## 2024-05-31 DIAGNOSIS — M222X1 Patellofemoral disorders, right knee: Secondary | ICD-10-CM | POA: Insufficient documentation

## 2024-05-31 LAB — CBC
Hematocrit: 45.3 % (ref 37.5–51.0)
Hemoglobin: 15.6 g/dL (ref 13.0–17.7)
MCH: 28.3 pg (ref 26.6–33.0)
MCHC: 34.4 g/dL (ref 31.5–35.7)
MCV: 82 fL (ref 79–97)
Platelets: 283 x10E3/uL (ref 150–450)
RBC: 5.51 x10E6/uL (ref 4.14–5.80)
RDW: 14.5 % (ref 11.6–15.4)
WBC: 5.2 x10E3/uL (ref 3.4–10.8)

## 2024-05-31 LAB — TESTOSTERONE: Testosterone: 318 ng/dL (ref 150–785)

## 2024-05-31 LAB — TSH: TSH: 2.34 u[IU]/mL (ref 0.450–4.500)

## 2024-05-31 NOTE — Progress Notes (Signed)
   Discussed the use of AI scribe software for clinical note transcription with the patient, who gave verbal consent to proceed.  History of Present Illness   Antonio [name] is a 18 year old male who presents for follow-up and medication refills.  Psychiatric medication management - Recent hospitalization with medication adjustments - Abilify  dose decreased - Initiated on Vraylar , which is effective - Restarted on metformin   Appetite changes - Decreased appetite  Activity level - Decreased activity level recently due to weather conditions  Right knee and lower extremity pain - Pain localized to the sides of the right knee, worsened by ascending stairs - Frequent issues with the right leg - Intermittent calf pain with prolonged walking - No recent trauma or injury to the knee        PERTINENT  PMH / PSH: I have reviewed the patient's medications, allergies, past medical and surgical history, smoking status.  Pertinent findings that relate to today's visit / issues include:   Physical Exam   CHEST: Clear to auscultation bilaterally, no wheezes, rhonchi, or crackles. CARDIOVASCULAR: Normal heart rate and rhythm, S1 and S2 normal without murmurs. KNEES: FROM no effusion. mild patellar grind pain on right      Results   LABS Thyroid  function tests: Within normal limits (01/2024) Lipid panel: Elevated LDL cholesterol (01/2024)       Assessment and Plan    Decreased appetite Suspected hormonal imbalance unlikely due to high testosterone  levels. Possible medication side effects or decreased physical activity. - Order testosterone  level. - Order CBC to assess hemoglobin and hematocrit. - Order TSH to evaluate thyroid  function.  Testosterone  therapy Difficulty obtaining refills. - Refill testosterone  prescription at [facility]. - Monitor testosterone  levels and adjust dosage based on lab results.  Patellofemoral pain syndrome, right knee Pain likely due to  patellofemoral pain syndrome from muscle imbalance causing inflammation under the patella. - Recommend short arc quadricep exercises. - Advise against squats. - Provide handout on patellofemoral pain syndrome and exercises.  Eczema - Refill eczema medication. - Advise to reduce application to once daily for maintenance, increase to twice daily if symptoms worsen.         Assessment & Plan Transgender man on hormone therapy  Insulin  resistance  Encounter for medication monitoring  Patellofemoral pain syndrome of right knee  Eczema, unspecified type

## 2024-06-01 LAB — IGE NUT PROF. W/COMPONENT RFLX

## 2024-06-02 ENCOUNTER — Ambulatory Visit: Payer: Self-pay | Admitting: Family Medicine

## 2024-06-03 LAB — IGE NUT PROF. W/COMPONENT RFLX
F017-IgE Hazelnut (Filbert): 19.8 kU/L — AB
F018-IgE Brazil Nut: 0.18 kU/L — AB
F020-IgE Almond: 0.79 kU/L — AB
F202-IgE Cashew Nut: 0.22 kU/L — AB
F203-IgE Pistachio Nut: 2.26 kU/L — AB
F256-IgE Walnut: 5.25 kU/L — AB
Macadamia Nut, IgE: 1.25 kU/L — AB
Peanut, IgE: 10.2 kU/L — AB
Pecan Nut IgE: 1.4 kU/L — AB

## 2024-06-03 LAB — PANEL 604350: Ber E 1 IgE: 0.23 kU/L — AB

## 2024-06-03 LAB — PANEL 604726
Cor A 1 IgE: 32 kU/L — AB
Cor A 14 IgE: 0.23 kU/L — AB
Cor A 8 IgE: 0.92 kU/L — AB
Cor A 9 IgE: <0.1 kU/L

## 2024-06-03 LAB — PEANUT COMPONENTS
F352-IgE Ara h 8: 0.27 kU/L — AB
F422-IgE Ara h 1: 0.28 kU/L — AB
F423-IgE Ara h 2: 1.64 kU/L — AB
F424-IgE Ara h 3: <0.1 kU/L
F427-IgE Ara h 9: 7.52 kU/L — AB
F447-IgE Ara h 6: 5.85 kU/L — AB

## 2024-06-03 LAB — ALLERGEN EGG WHITE F1: Egg White IgE: 1.18 kU/L — AB

## 2024-06-03 LAB — PANEL 604239: ANA O 3 IgE: <0.1 kU/L

## 2024-06-03 LAB — ALLERGEN COMPONENT COMMENTS

## 2024-06-03 LAB — PANEL 604721
Jug R 1 IgE: 4.08 kU/L — AB
Jug R 3 IgE: 3.17 kU/L — AB

## 2024-06-05 ENCOUNTER — Ambulatory Visit (INDEPENDENT_AMBULATORY_CARE_PROVIDER_SITE_OTHER): Admitting: Psychiatry

## 2024-06-05 ENCOUNTER — Encounter: Payer: Self-pay | Admitting: Psychiatry

## 2024-06-05 DIAGNOSIS — F332 Major depressive disorder, recurrent severe without psychotic features: Secondary | ICD-10-CM

## 2024-06-05 DIAGNOSIS — F649 Gender identity disorder, unspecified: Secondary | ICD-10-CM | POA: Diagnosis not present

## 2024-06-05 DIAGNOSIS — F840 Autistic disorder: Secondary | ICD-10-CM | POA: Diagnosis not present

## 2024-06-05 DIAGNOSIS — F603 Borderline personality disorder: Secondary | ICD-10-CM

## 2024-06-05 MED ORDER — CARIPRAZINE HCL 1.5 MG PO CAPS: 1.5000 mg | ORAL_CAPSULE | Freq: Every day | ORAL | 1 refills | Status: AC

## 2024-06-05 MED ORDER — BUSPIRONE HCL 10 MG PO TABS: 10.0000 mg | ORAL_TABLET | Freq: Two times a day (BID) | ORAL | 1 refills | Status: AC

## 2024-06-05 MED ORDER — RAMELTEON 8 MG PO TABS: 8.0000 mg | ORAL_TABLET | Freq: Every day | ORAL | 1 refills | Status: DC

## 2024-06-05 MED ORDER — METFORMIN HCL 500 MG PO TABS: 500.0000 mg | ORAL_TABLET | Freq: Two times a day (BID) | ORAL | 1 refills | Status: AC

## 2024-06-05 NOTE — Progress Notes (Signed)
 [facility] [address], [place]   Follow-up visit  Date of Service: 06/05/2024  CC/Purpose: Routine medication management follow up.    Antonio [name] is a 18 y.o. adult with a past psychiatric history of depression, anxiety, ASD who presents today for a psychiatric follow up appointment.    The patient was last seen on 05/08/24, at which time the following plan was established: Medication management:             - Decrease Abilify  to 7.5mg  daily for one week then stop this             - Start Vraylar  1.5mg  daily             - Continue Lexapro  20mg  daily             - Continue propranolol  10mg  at bedtime             - Continue Buspar  10mg  BID             - Stop trazodone              - Start Ramelteon  8mg  at bedtime              - Restart Metformin  500mg  BID             - Naltrexone  50mg  daily _______________________________________________________________________________________ Acute events/encounters since last visit: Hospitalized for 2 days for SI    Antonio [name] presents to clinic. He reports that things have been okay lately. He has been taking his medicine and feels that he is doing alright. He doesn't know if he noticed much change on the new medicines, but feels he is sleeping better and feels his mood is doing pretty well. He denies any suicidal thoughts or plans at this time. He has no major concerns today. Discussed his medicines and potentially coming off one or more in the future. No HI/AVH.    Sleep: stable Appetite: Decreased Depression: see HPI Bipolar symptoms:  denies Current suicidal/homicidal ideations:  passive thoughts Current auditory/visual hallucinations:  denied    Non-Suicidal Self-Injury: habitual self harm for years Suicide Attempt History: at least 6 times - overdoses, cutting, swallowed razor blade  Psychotherapy: Grayce Pouch - [facility]ante Previous psychiatric medication trials:  multiple: Zoloft  150mg , Pristiq  50mg ,  Prozac  20mg , Wellbutrin  XL 150mg , Effexor  XR 225mg , Intuniv  2mg , Seroquel  75mg , Latuda  40mg , Lamictal  - unclear dose.      Not in work or in school at this time Current Living Situation (including members of house hold): lives with mom and step dad - sees bio dad some     Allergies  Allergen Reactions   Egg-Derived Products Anaphylaxis   Justicia Adhatoda Anaphylaxis and Other (See Comments)    All TREE NUTS   Peanut -Containing Drug Products Anaphylaxis      Labs:  reviewed  Medical diagnoses: Patient Active Problem List   Diagnosis Date Noted   Patellofemoral pain syndrome of right knee 05/31/2024   Severe episode of recurrent major depressive disorder, without psychotic features (HCC) 04/29/2024   Suicide attempt (HCC) 11/14/2023   Obesity (BMI 30-39.9) 09/01/2023   Borderline personality disorder (HCC) 08/19/2023   Recurrent major depressive disorder 08/19/2023   Daytime somnolence 05/26/2023   Transgender man on hormone therapy 05/26/2023   Suicidal ideations 02/09/2023   MDD (major depressive disorder), recurrent severe, without psychosis (HCC) 01/02/2023   Suicide attempt by cutting of wrist (HCC) 07/28/2022   Autism spectrum disorder requiring support (level 1)  05/01/2021   Acne vulgaris 07/31/2020   Gastroesophageal reflux disease 09/27/2019   Insulin  resistance 10/17/2018   Deliberate self-cutting 10/17/2018   Social anxiety disorder 10/17/2018   Gender dysphoria    Eczema 06/06/2015   Asthma 08/23/2011    Psychiatric Specialty Exam: There were no vitals taken for this visit.There is no height or weight on file to calculate BMI.  General Appearance: Neat and Well Groomed  Eye Contact:  Good  Speech:  Clear and Coherent  Mood:  Dysphoric  Affect:  Appropriate  Thought Process:  Goal Directed  Orientation:  Full (Time, Place, and Person)  Thought Content:  Logical  Suicidal Thoughts:  No  Homicidal Thoughts:  No  Memory:  Immediate;   Good   Judgement:  Fair  Insight:  Fair  Psychomotor Activity:  Normal  Concentration:  Concentration: Good  Recall:  Good  Fund of Knowledge:  Good  Language:  Good  Assets:  Communication Skills Desire for Improvement Financial Resources/Insurance Housing Leisure Time Physical Health Social Support Talents/Skills Transportation  Cognition:  WNL      Assessment   Psychiatric Diagnoses:   ICD-10-CM   1. Autism spectrum disorder requiring support (level 1)  F84.0     2. Gender dysphoria  F64.9     3. MDD (major depressive disorder), recurrent severe, without psychosis (HCC)  F33.2     4. Borderline personality disorder (HCC)  F60.3        Patient complexity: Moderate   Patient Education and Counseling:  Supportive therapy provided for identified psychosocial stressors.  Medication education provided and decisions regarding medication regimen discussed with patient/guardian.   On assessment today, Antonio [name] has been stable since his last visit. He has responded well to Vraylar  and Ramelteon . I would like to eventually reduce polypharmacy, however given his new stability we will hold off on making any immediate adjustments. No AVH/HI.    Plan  Medication management:  - Vraylar  1.5mg  daily             - Continue Lexapro  20mg  daily             - Continue propranolol  10mg  at bedtime             - Continue Buspar  10mg  BID             - Ramelteon  8mg  at bedtime              - Metformin  500mg  BID  - Naltrexone  50mg  daily  Labs/Studies:  - reviewed  Additional recommendations:  - Continue with current therapist, Crisis plan reviewed and patient verbally contracts for safety. Go to ED with emergent symptoms or safety concerns, and Risks, benefits, side effects of medications, including any / all black box warnings, discussed with patient, who verbalizes their understanding   Follow Up: Return in 2 months - Call in the interim for any side-effects, decompensation, questions, or  problems between now and the next visit.   I have spent 30 minutes reviewing the patients chart, meeting with the patient and family, and reviewing medicines and side effects.   Selinda GORMAN Lauth, MD [facility]

## 2024-06-07 ENCOUNTER — Ambulatory Visit: Payer: 59 | Admitting: Allergy

## 2024-06-13 ENCOUNTER — Ambulatory Visit: Payer: Self-pay | Admitting: Allergy

## 2024-06-17 ENCOUNTER — Other Ambulatory Visit: Payer: Self-pay | Admitting: Child and Adolescent Psychiatry

## 2024-08-08 ENCOUNTER — Encounter: Payer: Self-pay | Admitting: Psychiatry

## 2024-08-08 ENCOUNTER — Telehealth (INDEPENDENT_AMBULATORY_CARE_PROVIDER_SITE_OTHER): Admitting: Psychiatry

## 2024-08-08 DIAGNOSIS — F603 Borderline personality disorder: Secondary | ICD-10-CM | POA: Diagnosis not present

## 2024-08-08 DIAGNOSIS — F332 Major depressive disorder, recurrent severe without psychotic features: Secondary | ICD-10-CM | POA: Diagnosis not present

## 2024-08-08 DIAGNOSIS — F649 Gender identity disorder, unspecified: Secondary | ICD-10-CM

## 2024-08-08 DIAGNOSIS — F840 Autistic disorder: Secondary | ICD-10-CM

## 2024-08-08 MED ORDER — GABAPENTIN 100 MG PO CAPS: ORAL_CAPSULE | ORAL | 3 refills | Status: AC

## 2024-08-08 NOTE — Progress Notes (Signed)
 [facility] [address], [place]   Follow-up visit  Date of Service: 08/08/2024  CC/Purpose: Routine medication management follow up.   Virtual Visit via Video Note  I connected with pt @ on 08/08/2024 at  1:00 PM EST by a video enabled telemedicine application and verified that I am speaking with the correct person using two identifiers.   I discussed the limitations of evaluation and management by telemedicine and the availability of in person appointments. The patient expressed understanding and agreed to proceed.  I discussed the assessment and treatment plan with the patient. The patient was provided an opportunity to ask questions and all were answered. The patient agreed with the plan and demonstrated an understanding of the instructions.   The patient was advised to call back or seek an in-person evaluation if the symptoms worsen or if the condition fails to improve as anticipated.  I provided 30 minutes of non-face-to-face time during this encounter.  The patient was located at home.  The provider was located at [facility].   Selinda GORMAN Lauth, MD    Antonio [name] is a 18 y.o. adult with a past psychiatric history of depression, anxiety, ASD who presents today for a psychiatric follow up appointment.    The patient was last seen on 06/05/24, at which time the following plan was established: Medication management:             - Vraylar  1.5mg  daily             - Continue Lexapro  20mg  daily             - Continue propranolol  10mg  at bedtime             - Continue Buspar  10mg  BID             - Ramelteon  8mg  at bedtime              - Metformin  500mg  BID             - Naltrexone  50mg  daily _______________________________________________________________________________________ Acute events/encounters since last visit: denies    Antonio [name] presents via video. He reports that he feels he is doing pretty well overall. He has been taking  his medicine regularly and denies missing doses. He feels that his mood has been pretty good and denies any depressive symptoms lately. He notes that he has a lot of night sweats, isn't sure if this is due to medicine. He notes his mother wants him to switch ramelteon  due to struggling with sleep still. Discussed options with this medicine. He is okay with stopping propranolol . No HI/AVH.    Sleep: stable Appetite: Decreased Depression: see HPI Bipolar symptoms:  denies Current suicidal/homicidal ideations:  passive thoughts Current auditory/visual hallucinations:  denied    Non-Suicidal Self-Injury: habitual self harm for years Suicide Attempt History: at least 6 times - overdoses, cutting, swallowed razor blade  Psychotherapy: Grayce Pouch - sante Previous psychiatric medication trials:  multiple: Zoloft  150mg , Pristiq  50mg , Prozac  20mg , Wellbutrin  XL 150mg , Effexor  XR 225mg , Intuniv  2mg , Seroquel  75mg , Latuda  40mg , Lamictal  - unclear dose.      Not in work or in school at this time Current Living Situation (including members of house hold): lives with mom and step dad - sees bio dad some     Allergies  Allergen Reactions   Egg Protein-Containing Drug Products Anaphylaxis   Justicia Adhatoda Anaphylaxis and Other (See Comments)    All TREE NUTS  Peanut -Containing Drug Products Anaphylaxis      Labs:  reviewed  Medical diagnoses: Patient Active Problem List   Diagnosis Date Noted   Patellofemoral pain syndrome of right knee 05/31/2024   Severe episode of recurrent major depressive disorder, without psychotic features (HCC) 04/29/2024   Suicide attempt (HCC) 11/14/2023   Obesity (BMI 30-39.9) 09/01/2023   Borderline personality disorder (HCC) 08/19/2023   Recurrent major depressive disorder 08/19/2023   Daytime somnolence 05/26/2023   Transgender man on hormone therapy 05/26/2023   Suicidal ideations 02/09/2023   MDD (major depressive disorder), recurrent severe,  without psychosis (HCC) 01/02/2023   Suicide attempt by cutting of wrist (HCC) 07/28/2022   Autism spectrum disorder requiring support (level 1) 05/01/2021   Acne vulgaris 07/31/2020   Gastroesophageal reflux disease 09/27/2019   Insulin  resistance 10/17/2018   Deliberate self-cutting 10/17/2018   Social anxiety disorder 10/17/2018   Gender dysphoria    Eczema 06/06/2015   Asthma 08/23/2011    Psychiatric Specialty Exam: There were no vitals taken for this visit.There is no height or weight on file to calculate BMI.  General Appearance: Neat and Well Groomed  Eye Contact:  Good  Speech:  Clear and Coherent  Mood:  Dysphoric  Affect:  Appropriate  Thought Process:  Goal Directed  Orientation:  Full (Time, Place, and Person)  Thought Content:  Logical  Suicidal Thoughts:  No  Homicidal Thoughts:  No  Memory:  Immediate;   Good  Judgement:  Fair  Insight:  Fair  Psychomotor Activity:  Normal  Concentration:  Concentration: Good  Recall:  Good  Fund of Knowledge:  Good  Language:  Good  Assets:  Communication Skills Desire for Improvement Financial Resources/Insurance Housing Leisure Time Physical Health Social Support Talents/Skills Transportation  Cognition:  WNL      Assessment   Psychiatric Diagnoses:   ICD-10-CM   1. Autism spectrum disorder requiring support (level 1)  F84.0     2. Gender dysphoria  F64.9     3. MDD (major depressive disorder), recurrent severe, without psychosis (HCC)  F33.2     4. Borderline personality disorder (HCC)  F60.3       Patient complexity: Moderate   Patient Education and Counseling:  Supportive therapy provided for identified psychosocial stressors.  Medication education provided and decisions regarding medication regimen discussed with patient/guardian.   On assessment today, Antonio [name] has been stable since his last visit. He does struggle with sleep and night sweats some. We will make changes to his nighttime medicine and  will stop propranolol  to reduce polypharmacy. No AVH/HI.    Plan  Medication management:  - Vraylar  1.5mg  daily             - Continue Lexapro  20mg  daily             - Stop propranolol  10mg  at bedtime             - Continue Buspar  10mg  BID             - Stop Remelteon - Start gabapentin  100mg -200mg  at bedtime               - Metformin  500mg  BID  - Naltrexone  50mg  daily  Labs/Studies:  - reviewed  Additional recommendations:  - Continue with current therapist, Crisis plan reviewed and patient verbally contracts for safety. Go to ED with emergent symptoms or safety concerns, and Risks, benefits, side effects of medications, including any / all black box warnings, discussed with patient, who verbalizes  their understanding   Follow Up: Return in 2 months - Call in the interim for any side-effects, decompensation, questions, or problems between now and the next visit.   I have spent 30 minutes reviewing the patients chart, meeting with the patient and family, and reviewing medicines and side effects.   Selinda GORMAN Lauth, MD [facility]

## 2024-08-29 ENCOUNTER — Ambulatory Visit: Payer: Self-pay | Admitting: Family Medicine

## 2024-08-29 ENCOUNTER — Encounter: Payer: Self-pay | Admitting: Family Medicine

## 2024-08-29 VITALS — BP 98/62 | HR 82 | Ht 65.0 in | Wt 208.0 lb

## 2024-08-29 DIAGNOSIS — E88819 Insulin resistance, unspecified: Secondary | ICD-10-CM

## 2024-08-29 DIAGNOSIS — F640 Transsexualism: Secondary | ICD-10-CM | POA: Diagnosis not present

## 2024-08-29 DIAGNOSIS — Z79899 Other long term (current) drug therapy: Secondary | ICD-10-CM | POA: Diagnosis not present

## 2024-08-29 DIAGNOSIS — R400 Somnolence: Secondary | ICD-10-CM

## 2024-08-29 DIAGNOSIS — F332 Major depressive disorder, recurrent severe without psychotic features: Secondary | ICD-10-CM | POA: Diagnosis not present

## 2024-08-29 LAB — POCT GLYCOSYLATED HEMOGLOBIN (HGB A1C): Hemoglobin A1C: 5.3 % (ref 4.0–5.6)

## 2024-08-29 MED ORDER — TESTOSTERONE CYPIONATE 200 MG/ML IM SOLN: INTRAMUSCULAR | 1 refills | Status: AC

## 2024-08-29 NOTE — Progress Notes (Signed)
° °  Discussed the use of AI scribe software for clinical note transcription with the patient, who gave verbal consent to proceed.  History of Present Illness   Antonio [name] is a 18 year old male with sleep apnea and depression who presents with fatigue and headache.  Fatigue and sleep disturbance - Increased fatigue despite regular CPAP use - Sleeps long hours but does not feel rested - Frequently sleeps until noon, associated with depressive episodes  Headache - Wakes with headaches - Suspects headaches may be related to CPAP use or teeth grinding - Requires a new night guard and is considering an over-the-counter option - Wisdom teeth are erupting  Weight loss and nutritional intake - Gradual weight loss to 208 lb from 223 lb since May - Attributes weight loss to poor intake and eating whatever is available      F/u gender affirming hormone therapy. Happy with transition to date.  PERTINENT  PMH / PSH: I have reviewed the patients medications, allergies, past medical and surgical history, smoking status.  Pertinent findings that relate to today's visit / issues include:   Physical Exam   MEASUREMENTS: Weight- 208.     PSYCH: AxOx4. Good eye contact.. No psychomotor retardation or agitation. Appropriate speech fluency and content. Asks and answers questions appropriately. Mood is congruent. CV RRR GEN WD WN NAD     Assessment and Plan    Transgender man on hormone therapy Testosterone  levels stable at 318 ng/dL. Hemoglobin normal. - Continue current testosterone  therapy regimen. - Ordered CBC and testosterone  levels to monitor hemoglobin and testosterone  levels.  Obstructive sleep apnea Increased fatigue and non-restorative sleep despite CPAP. Headaches may relate to CPAP or bruxism. - Continue CPAP therapy. - Follow up with CPAP provider to review usage data and adjust settings if necessary.  Major depressive disorder, recurrent Increased fatigue possibly  linked to depression. On Lexapro , Vraylar , and Buspar . Phototherapy considered. - Continue current psychiatric care and reviewed his current medications: Lexapro , Vraylar , and Buspar . - Consider initiate phototherapy as ordered by the psychiatrist.  Sleep related bruxism may be cause of headaches Needs new night guard. - Consider using an over-the-counter night guard until a custom-fitted one is obtained.  General health maintenance Allergic to flu shots. Weight decreased from 223 lbs to 208 lbs. Focus on protein intake. - Ensure adequate protein intake to support weight maintenance. - Monitor weight trends.

## 2024-08-29 NOTE — Patient Instructions (Signed)
Great to see you! I will send you a note about your labs. 

## 2024-08-30 ENCOUNTER — Ambulatory Visit: Admitting: Pulmonary Disease

## 2024-08-30 ENCOUNTER — Encounter: Payer: Self-pay | Admitting: Pulmonary Disease

## 2024-08-30 VITALS — BP 90/58 | HR 73 | Ht 65.0 in | Wt 209.0 lb

## 2024-08-30 DIAGNOSIS — Z87891 Personal history of nicotine dependence: Secondary | ICD-10-CM | POA: Diagnosis not present

## 2024-08-30 DIAGNOSIS — G4733 Obstructive sleep apnea (adult) (pediatric): Secondary | ICD-10-CM | POA: Diagnosis not present

## 2024-08-30 LAB — TESTOSTERONE: Testosterone: 110 ng/dL — ABNORMAL LOW (ref 264–916)

## 2024-08-30 LAB — CBC
Hematocrit: 43.6 % (ref 37.5–51.0)
Hemoglobin: 14 g/dL (ref 13.0–17.7)
MCH: 26.1 pg — ABNORMAL LOW (ref 26.6–33.0)
MCHC: 32.1 g/dL (ref 31.5–35.7)
MCV: 81 fL (ref 79–97)
Platelets: 308 x10E3/uL (ref 150–450)
RBC: 5.37 x10E6/uL (ref 4.14–5.80)
RDW: 14 % (ref 11.6–15.4)
WBC: 4.5 x10E3/uL (ref 3.4–10.8)

## 2024-08-30 NOTE — Progress Notes (Signed)
 Antonio [name]    [id number]    13-Oct-2005  Primary Care Physician:Neal, Camie CROME, MD  Referring Physician: Rosalynn Camie CROME, MD [address] [place],  [place] [address]  Chief complaint:   Patient in for follow-up for obstructive sleep apnea  HPI:  Patient was diagnosed with mild obstructive sleep apnea Has been using CPAP therapy Tolerating CPAP well  Has been doing well overall Not having difficulty with his CPAP  Sleeps for about 6 hours Wakes up feeling rested  Does not usually exercise  Non-smoker  Outpatient Encounter Medications as of 08/30/2024  Medication Sig   albuterol  (VENTOLIN  HFA) 108 (90 Base) MCG/ACT inhaler Inhale 1-2 puffs into the lungs every 4 (four) hours as needed for wheezing or shortness of breath.   busPIRone  (BUSPAR ) 10 MG tablet Take 1 tablet (10 mg total) by mouth 2 (two) times daily.   cariprazine  (VRAYLAR ) 1.5 MG capsule Take 1 capsule (1.5 mg total) by mouth daily.   EPINEPHrine  (EPIPEN  2-PAK) 0.3 mg/0.3 mL IJ SOAJ injection Inject 0.3 mg into the muscle as needed for anaphylaxis (AS DIRECTED).   EPINEPHrine  (NEFFY ) 2 MG/0.1ML SOLN Place 1 spray into the nose as needed (Allergic reaction).   escitalopram  (LEXAPRO ) 20 MG tablet Take 1 tablet (20 mg total) by mouth daily.   gabapentin  (NEURONTIN ) 100 MG capsule Take 1-2 capsules nightly for sleep and anxiety   ibuprofen  (ADVIL ) 200 MG tablet Take 400 mg by mouth every 6 (six) hours as needed for mild pain (pain score 1-3).   loratadine  (CLARITIN ) 10 MG tablet Take 1 tablet (10 mg total) by mouth daily as needed for allergies or itching.   metFORMIN  (GLUCOPHAGE ) 500 MG tablet Take 1 tablet (500 mg total) by mouth 2 (two) times daily with a meal.   mometasone  (NASONEX ) 50 MCG/ACT nasal spray Place 1-2 sprays into the nose daily as needed (for congestion or drainge).   naltrexone  (DEPADE) 50 MG tablet Take 1 tablet (50 mg total) by mouth daily.   omeprazole  (PRILOSEC) 40 MG capsule  Take 1 capsule (40 mg total) by mouth in the morning and at bedtime.   PATADAY  0.1 % ophthalmic solution Place 1 drop into both eyes as needed for allergies.   propranolol  (INDERAL ) 10 MG tablet Take 1 tablet (10 mg total) by mouth 2 (two) times daily as needed (anxiety).   testosterone  cypionate (DEPOTESTOSTERONE CYPIONATE) 200 MG/ML injection INJECT 80MG  (0.4ML) INTO THE SKIN EVERY SUNDAY. DISCARD REMAINDER OF VIAL AFTER USE, NO PRESERVATIVE   triamcinolone  ointment (KENALOG ) 0.1 % Apply to affected areas twice a day as directed   No facility-administered encounter medications on file as of 08/30/2024.    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   Asthma    Bupropion  overdose 01/16/2020   Complication of anesthesia    gets disoriented and shakes after surgery   Deliberate self-cutting 10/17/2018   Depression    Eczema    Fracture of 5th metatarsal    Gender dysphoria    Intentional overdose of drug in tablet form (HCC) 04/03/2020   MDD (major depressive disorder), recurrent severe, without psychosis (HCC) 11/02/2021   Moderate  episode of recurrent major depressive disorder (HCC) 04/24/2020   Multiple allergies    Severe episode of recurrent major depressive disorder, without psychotic features (HCC) 01/02/2020   Suicidal ideations 08/31/2022   Suicide attempt by cutting of wrist (HCC) 07/28/2022    Past Surgical History:  Procedure Laterality Date   SUPPRELIN  IMPLANT Left 01/22/2019   Procedure: SUPPRELIN  IMPLANT;  Surgeon: Chuckie Casimiro KIDD, MD;  Location: [facility];  Service: Pediatrics;  Laterality: Left;    SUPPRELIN  REMOVAL N/A 03/03/2020   Procedure: SUPPRELIN  REMOVAL;  Surgeon: Chuckie Casimiro KIDD, MD;  Location: [facility];  Service: Pediatrics;  Laterality: N/A;   TONSILLECTOMY     TYMPANOSTOMY TUBE PLACEMENT      Family History  Problem Relation Age of Onset   Depression Mother    Anxiety disorder Mother    Hypertension Maternal Grandmother    Allergic rhinitis Neg Hx    Angioedema Neg Hx    Atopy Neg Hx    Eczema Neg Hx    Immunodeficiency Neg Hx    Urticaria Neg Hx     Social History   Socioeconomic History   Marital status: Single    Spouse name: Not on file   Number of children: Not on file   Years of education: Not on file   Highest education level: 12th grade  Occupational History   Not on file  Tobacco Use   Smoking status: Never    Passive exposure: Never   Smokeless tobacco: Never  Vaping Use   Vaping status: Former   Substances: Nicotine , Flavoring   Devices: Nicotine   Substance and Sexual Activity   Alcohol use: No   Drug use: Not Currently    Types: Marijuana   Sexual activity: Not Currently  Other Topics Concern   Not on file  Social History Narrative   Lives with mom and stepdad.      He is in 12th grade at [employer] 23-24 school year      He enjoys writing and listen to music, writes poetry   Social Drivers of Health   Tobacco Use: Low Risk (08/30/2024)   Patient History    Smoking Tobacco Use: Never    Smokeless Tobacco Use: Never    Passive Exposure: Never  Financial Resource Strain: High Risk (08/28/2024)   Overall Financial Resource Strain (CARDIA)    Difficulty of Paying Living Expenses: Very hard  Food Insecurity: Patient Declined (08/28/2024)   Epic    Worried About Programme Researcher, Broadcasting/film/video in the Last Year: Patient declined    Barista in the Last Year: Patient declined  Transportation Needs: Unmet Transportation Needs (08/28/2024)   Epic    Lack of Transportation (Medical): Yes    Lack of  Transportation (Non-Medical): Yes  Physical Activity: Insufficiently Active (08/28/2024)   Exercise Vital Sign    Days of Exercise per Week: 1 day    Minutes of Exercise per Session: 10 min  Stress: Stress Concern Present (08/28/2024)   [employer] of Occupational Health - Occupational Stress Questionnaire    Feeling of Stress: Rather much  Social Connections: Unknown (08/28/2024)   Social Connection and Isolation Panel    Frequency of Communication with Friends and Family: Patient declined    Frequency of Social Gatherings with Friends and Family: Patient declined    Attends Religious Services: Never    Database Administrator or Organizations: No    Attends Banker Meetings: Not on  file    Marital Status: Never married  Intimate Partner Violence: Not At Risk (04/29/2024)   Epic    Fear of Current or Ex-Partner: No    Emotionally Abused: No    Physically Abused: No    Sexually Abused: No  Depression (PHQ2-9): High Risk (08/29/2024)   Depression (PHQ2-9)    PHQ-2 Score: 12  Alcohol Screen: Low Risk (04/29/2024)   Alcohol Screen    Last Alcohol Screening Score (AUDIT): 0  Housing: High Risk (08/28/2024)   Epic    Unable to Pay for Housing in the Last Year: Yes    Number of Times Moved in the Last Year: Not on file    Homeless in the Last Year: No  Utilities: Not At Risk (04/29/2024)   Epic    Threatened with loss of utilities: No  Health Literacy: Not on file    Review of Systems  Respiratory:  Positive for apnea.   Psychiatric/Behavioral:  Positive for sleep disturbance.     Vitals:   08/30/24 1515  BP: (!) 90/58  Pulse: 73  SpO2: 97%     Physical Exam Constitutional:      Appearance: He is obese.  HENT:     Head: Normocephalic.     Nose: Nose normal.     Mouth/Throat:     Mouth: Mucous membranes are moist.  Eyes:     General: No scleral icterus. Cardiovascular:     Rate and Rhythm: Normal rate and regular rhythm.     Heart sounds: No  murmur heard.    No friction rub.  Pulmonary:     Effort: No respiratory distress.     Breath sounds: No stridor. No wheezing or rhonchi.  Musculoskeletal:     Cervical back: No rigidity or tenderness.  Lymphadenopathy:     Cervical: No cervical adenopathy.  Skin:    General: Skin is warm.  Neurological:     General: No focal deficit present.     Mental Status: He is alert.  Psychiatric:        Mood and Affect: Mood normal.     Data Reviewed: Compliance data shows excellent compliance of 93% Average use of 6 hours 5 minutes Settings of 5-15 with 95 percentile pressure of 9.7 with residual AHI 1.7  Sleep study in April 2025 shows AHI of 11.3  Assessment/Plan: Mild obstructive sleep apnea which appears adequately treated with CPAP therapy  Patient still does have some daytime sleepiness  Tolerating CPAP well, benefiting from CPAP use  Encouraged to continue CPAP nightly  Weight loss efforts encouraged   Activities as tolerated  Tentative follow-up in 4 months  Encouraged to ensure that he is getting 7 hours of sleep every night    Jennet Epley MD [facility] 08/30/2024, 4:07 PM  CC: Rosalynn Camie CROME, MD

## 2024-08-30 NOTE — Patient Instructions (Signed)
 Download from your machine shows it is working well  Make sure you are getting about 7 hours of sleep nightly  Graded exercises as tolerated  If you need to take a nap during the day to feel rejuvenated, trial limited to less than an hour of nap time and always before too late in the day  Call us  with significant concerns  Follow-up in about 4 months

## 2024-09-03 ENCOUNTER — Ambulatory Visit: Payer: Self-pay | Admitting: Family Medicine

## 2024-09-06 ENCOUNTER — Other Ambulatory Visit: Payer: Self-pay | Admitting: Psychiatry

## 2024-12-31 ENCOUNTER — Ambulatory Visit: Admitting: Pulmonary Disease
